# Patient Record
Sex: Female | Born: 1955 | Race: White | Hispanic: No | Marital: Married | Smoking: Former smoker
Health system: Southern US, Community
[De-identification: ages and names within clinical notes are randomized; demographics above are authoritative.]

## PROBLEM LIST (undated history)

## (undated) DIAGNOSIS — Z8719 Personal history of other diseases of the digestive system: Secondary | ICD-10-CM

## (undated) DIAGNOSIS — M65949 Unspecified synovitis and tenosynovitis, unspecified hand: Secondary | ICD-10-CM

## (undated) DIAGNOSIS — J40 Bronchitis, not specified as acute or chronic: Secondary | ICD-10-CM

## (undated) DIAGNOSIS — N393 Stress incontinence (female) (male): Secondary | ICD-10-CM

## (undated) DIAGNOSIS — Z972 Presence of dental prosthetic device (complete) (partial): Secondary | ICD-10-CM

## (undated) DIAGNOSIS — Z9989 Dependence on other enabling machines and devices: Secondary | ICD-10-CM

## (undated) DIAGNOSIS — M1990 Unspecified osteoarthritis, unspecified site: Secondary | ICD-10-CM

## (undated) DIAGNOSIS — I872 Venous insufficiency (chronic) (peripheral): Secondary | ICD-10-CM

## (undated) DIAGNOSIS — M659 Synovitis and tenosynovitis, unspecified: Secondary | ICD-10-CM

## (undated) DIAGNOSIS — Z87820 Personal history of traumatic brain injury: Secondary | ICD-10-CM

## (undated) DIAGNOSIS — K5909 Other constipation: Secondary | ICD-10-CM

## (undated) DIAGNOSIS — F1190 Opioid use, unspecified, uncomplicated: Secondary | ICD-10-CM

## (undated) DIAGNOSIS — F32A Depression, unspecified: Secondary | ICD-10-CM

## (undated) DIAGNOSIS — G43909 Migraine, unspecified, not intractable, without status migrainosus: Secondary | ICD-10-CM

## (undated) DIAGNOSIS — R351 Nocturia: Secondary | ICD-10-CM

## (undated) DIAGNOSIS — G4733 Obstructive sleep apnea (adult) (pediatric): Secondary | ICD-10-CM

## (undated) DIAGNOSIS — F329 Major depressive disorder, single episode, unspecified: Secondary | ICD-10-CM

## (undated) DIAGNOSIS — Z872 Personal history of diseases of the skin and subcutaneous tissue: Secondary | ICD-10-CM

## (undated) DIAGNOSIS — R413 Other amnesia: Secondary | ICD-10-CM

## (undated) DIAGNOSIS — Z9289 Personal history of other medical treatment: Secondary | ICD-10-CM

## (undated) DIAGNOSIS — R234 Changes in skin texture: Secondary | ICD-10-CM

## (undated) DIAGNOSIS — R058 Other specified cough: Secondary | ICD-10-CM

## (undated) DIAGNOSIS — T8452XA Infection and inflammatory reaction due to internal left hip prosthesis, initial encounter: Secondary | ICD-10-CM

## (undated) DIAGNOSIS — R262 Difficulty in walking, not elsewhere classified: Secondary | ICD-10-CM

## (undated) DIAGNOSIS — K08109 Complete loss of teeth, unspecified cause, unspecified class: Secondary | ICD-10-CM

## (undated) DIAGNOSIS — R05 Cough: Secondary | ICD-10-CM

## (undated) DIAGNOSIS — K219 Gastro-esophageal reflux disease without esophagitis: Secondary | ICD-10-CM

## (undated) HISTORY — PX: TOTAL KNEE ARTHROPLASTY: SHX125

## (undated) HISTORY — PX: OTHER SURGICAL HISTORY: SHX169

## (undated) HISTORY — PX: APPENDECTOMY: SHX54

---

## 1989-06-26 HISTORY — PX: OTHER SURGICAL HISTORY: SHX169

## 1998-06-10 ENCOUNTER — Encounter: Payer: Self-pay | Admitting: Internal Medicine

## 1998-06-10 ENCOUNTER — Ambulatory Visit (HOSPITAL_COMMUNITY): Admission: RE | Admit: 1998-06-10 | Discharge: 1998-06-10 | Payer: Self-pay | Admitting: Internal Medicine

## 1999-12-18 ENCOUNTER — Emergency Department (HOSPITAL_COMMUNITY): Admission: EM | Admit: 1999-12-18 | Discharge: 1999-12-18 | Payer: Self-pay | Admitting: Emergency Medicine

## 2000-03-25 ENCOUNTER — Ambulatory Visit (HOSPITAL_COMMUNITY): Admission: RE | Admit: 2000-03-25 | Discharge: 2000-03-25 | Payer: Self-pay | Admitting: *Deleted

## 2001-06-08 ENCOUNTER — Ambulatory Visit (HOSPITAL_COMMUNITY): Admission: RE | Admit: 2001-06-08 | Discharge: 2001-06-08 | Payer: Self-pay | Admitting: Family Medicine

## 2002-01-02 ENCOUNTER — Encounter: Admission: RE | Admit: 2002-01-02 | Discharge: 2002-01-02 | Payer: Self-pay | Admitting: *Deleted

## 2002-01-02 ENCOUNTER — Encounter: Payer: Self-pay | Admitting: *Deleted

## 2002-05-18 ENCOUNTER — Other Ambulatory Visit: Admission: RE | Admit: 2002-05-18 | Discharge: 2002-05-18 | Payer: Self-pay | Admitting: Family Medicine

## 2002-05-31 ENCOUNTER — Ambulatory Visit (HOSPITAL_COMMUNITY): Admission: RE | Admit: 2002-05-31 | Discharge: 2002-05-31 | Payer: Self-pay | Admitting: Family Medicine

## 2002-06-20 ENCOUNTER — Encounter: Admission: RE | Admit: 2002-06-20 | Discharge: 2002-06-20 | Payer: Self-pay | Admitting: *Deleted

## 2003-05-21 ENCOUNTER — Other Ambulatory Visit: Admission: RE | Admit: 2003-05-21 | Discharge: 2003-05-21 | Payer: Self-pay | Admitting: Family Medicine

## 2003-06-05 ENCOUNTER — Encounter: Payer: Self-pay | Admitting: Family Medicine

## 2003-06-05 ENCOUNTER — Encounter: Admission: RE | Admit: 2003-06-05 | Discharge: 2003-06-05 | Payer: Self-pay | Admitting: Family Medicine

## 2003-07-11 ENCOUNTER — Encounter: Payer: Self-pay | Admitting: Internal Medicine

## 2003-07-11 ENCOUNTER — Encounter: Admission: RE | Admit: 2003-07-11 | Discharge: 2003-07-11 | Payer: Self-pay | Admitting: Internal Medicine

## 2003-08-09 ENCOUNTER — Encounter: Payer: Self-pay | Admitting: Internal Medicine

## 2003-08-09 ENCOUNTER — Encounter: Admission: RE | Admit: 2003-08-09 | Discharge: 2003-08-09 | Payer: Self-pay | Admitting: Internal Medicine

## 2003-08-12 ENCOUNTER — Observation Stay (HOSPITAL_COMMUNITY): Admission: EM | Admit: 2003-08-12 | Discharge: 2003-08-13 | Payer: Self-pay | Admitting: Emergency Medicine

## 2003-08-12 ENCOUNTER — Encounter: Payer: Self-pay | Admitting: Emergency Medicine

## 2003-08-12 ENCOUNTER — Encounter (INDEPENDENT_AMBULATORY_CARE_PROVIDER_SITE_OTHER): Payer: Self-pay

## 2003-08-12 HISTORY — PX: LAPAROSCOPIC CHOLECYSTECTOMY: SUR755

## 2006-10-26 HISTORY — PX: OTHER SURGICAL HISTORY: SHX169

## 2007-10-27 HISTORY — PX: TOTAL KNEE ARTHROPLASTY: SHX125

## 2007-10-27 HISTORY — PX: KNEE ARTHROSCOPY: SUR90

## 2009-07-02 ENCOUNTER — Ambulatory Visit (HOSPITAL_COMMUNITY): Admission: RE | Admit: 2009-07-02 | Discharge: 2009-07-02 | Payer: Self-pay | Admitting: Orthopedic Surgery

## 2010-06-13 ENCOUNTER — Emergency Department (HOSPITAL_COMMUNITY)
Admission: EM | Admit: 2010-06-13 | Discharge: 2010-06-13 | Payer: Self-pay | Source: Home / Self Care | Admitting: Emergency Medicine

## 2010-08-26 HISTORY — PX: OTHER SURGICAL HISTORY: SHX169

## 2010-10-15 ENCOUNTER — Inpatient Hospital Stay: Admission: RE | Admit: 2010-10-15 | Payer: Self-pay | Source: Home / Self Care | Admitting: Orthopedic Surgery

## 2010-10-26 HISTORY — PX: TOTAL HIP ARTHROPLASTY: SHX124

## 2011-03-13 NOTE — H&P (Signed)
[name], [name]                        ACCOUNT NO.:  [id number]   MEDICAL RECORD NO.:  [id number]                   PATIENT TYPE:  INP   LOCATION:  0101                                 FACILITY:  [facility]   PHYSICIAN:  Lorre Munroe., M.D.            DATE OF BIRTH:  1955/12/10   DATE OF ADMISSION:  08/12/2003  DATE OF DISCHARGE:                                HISTORY & PHYSICAL   CHIEF COMPLAINT:  Abdominal pain.   PRESENT ILLNESS:  The patient is a 55 year old white female who has not felt  well for several days but last night developed severe pain in the area of  the right scapula, epigastrium, lower chest, and right upper quadrant.  She  vomited a time or two.  The pain did not remit, and she came to the  emergency department where electrocardiogram was normal, and a gallbladder  ultrasound demonstrated a distended gallbladder with stones, a stone  evidently impacted in the cystic duct.  The intrahepatic and extrahepatic  biliary ducts were normal size.  The liver tests were normal, and the CBC  was normal.  The patient is felt to have acute cholecystitis and is admitted  to the hospital for a laparoscopic cholecystectomy.   PAST MEDICAL HISTORY:  1. 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  2. 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  3. She has gastroesophageal reflux.  For that, she is on Protonix.  4. She takes Depakote and trazodone for depression.   SOCIAL HISTORY:  The patient smoked until several months ago and stopped,  and she does not smoke any longer.  She does drink alcoholic beverages.  No  history of heart disease or other serious chronic aliments.   FAMILY HISTORY & CHILDHOOD ILLNESSES:  Unremarkable.   PHYSICAL EXAMINATION:  GENERAL:  The patient is overweight.  No acute  distress, but she said she was in pain.  VITAL SIGNS:  Vital signs were normal per nursing staff.  No fever.  HEAD/NECK/EYES/EARS/NOSE/MOUTH/THROAT:  Unremarkable with a normal thyroid  and no lymphadenopathy.  CHEST:  Clear to auscultation.  HEART:  Rate and rhythm normal.  No murmur or gallop.  BREASTS:  Normal.  ABDOMEN:  Tender in the right upper quadrant with a palpable mass.  Otherwise normal with no hernia, organomegaly, or mass.  GENITOURINARY:  External genitalia normal.  RECTAL:  Not performed.  EXTREMITIES:  Normal.  SKIN:  Normal.  LYMPH NODES:  Not enlarged.  NEUROLOGICAL:  Normal.    IMPRESSION:  Acute cholecystitis and cholelithiasis.   PLAN:  Laparoscopic cholecystectomy.  The patient agrees to this.  Lorre Munroe., M.D.    WB/MEDQ  D:  08/12/2003  T:  08/12/2003  Job:  [id number]

## 2011-03-13 NOTE — Op Note (Signed)
[name], [name]                        ACCOUNT NO.:  [id number]   MEDICAL RECORD NO.:  [id number]                   PATIENT TYPE:  INP   LOCATION:  0101                                 FACILITY:  [facility]   PHYSICIAN:  Lorre Munroe., M.D.            DATE OF BIRTH:  1956/06/09   DATE OF PROCEDURE:  08/12/2003  DATE OF DISCHARGE:                                 OPERATIVE REPORT   PREOPERATIVE DIAGNOSIS:  Cholelithiasis and acute cholecystitis.   POSTOPERATIVE DIAGNOSIS:  Cholelithiasis and acute cholecystitis.   OPERATION/PROCEDURE:  Laparoscopic cholecystectomy.   SURGEON:  Lebron Conners, M.D.   ASSISTANT:  Anselm Pancoast. Zachery Dakins, M.D.   ANESTHESIA:  General.   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  I put in my finger to make sure that the local area was  free of adhesions.  I then put a 0 Vicryl pursestring suture in the fascia  and secured a Hasson cannula and inflated the abdomen with CO2.  Put in the  camera and saw that there were some adhesions in the upper and lower midline  but I had good access to the right upper quadrant.  Under direct vision,  through anesthetized sites, I placed three additional ports and placed the  patient head-up foot-down and tilted to the left.  I took down some  adhesions of the omentum to the undersurface of the liver and then I had  clear view of a markedly distended edematous gallbladder.  Using the suction  aspirator, I emptied it of bile which was mostly depigmented, indicating  cystic duct obstruction.  I then retracted the gallbladder fundus toward the  right shoulder and took  down adhesions further until I saw the infundibulum  of the gallbladder and I dissected it down further until I clearly  identified the cystic duct, emerging from the infundibulum of the  gallbladder.  I clipped it with four clips and divided between the two which  were closest to the gallbladder.  I then dissected out the cystic artery and  found two distinct branches and ligated each with three clips and cut  between the two closer to the gallbladder.  I then removed the gallbladder  from its fossa using the cautery and got hemostasis with the cautery.  After  detaching the gallbladder from the liver, I placed it in a plastic pouch and  removed it through the umbilical incision.  Prior to removal copiously  irrigated the right upper quadrant and removed the irrigant, again assured  that hemostasis was good.  After removal of the gallbladder from the body  through  the umbilical incision, I tied the pursestring suture and then  removed the lateral ports under direct vision and allowed the CO2 to escape,  then removed the epigastric port.  I closed all of the incisions with  intracuticular 4-0 Vicryl and Steri-Strips.  She was stable through the  procedure.                                                Lorre Munroe., M.D.   WB/MEDQ  D:  08/12/2003  T:  08/12/2003  Job:  [id number]

## 2013-10-26 HISTORY — PX: REVISION TOTAL HIP ARTHROPLASTY: SHX766

## 2013-11-26 HISTORY — PX: OTHER SURGICAL HISTORY: SHX169

## 2014-07-06 ENCOUNTER — Encounter (HOSPITAL_BASED_OUTPATIENT_CLINIC_OR_DEPARTMENT_OTHER): Payer: Self-pay | Admitting: *Deleted

## 2014-07-09 ENCOUNTER — Encounter (HOSPITAL_BASED_OUTPATIENT_CLINIC_OR_DEPARTMENT_OTHER): Payer: Self-pay | Admitting: *Deleted

## 2014-07-09 NOTE — Progress Notes (Signed)
NPO AFTER MN WITH EXCEPTION CLEAR LIQUIDS UNTIL 0700 (NO CREAM/ MILK PRODUCTS).  ARRIVE AT 1130. NEEDS HG.  WILL TAKE OPANA, PRILOSEC, GABAPENTIN, AND CELEXA AM DOS W/ SIPS OF WATER AND IF NEEDED MAY TAKE PRN MEDS.  PT UNABLE TO WALK DUE TO NO HIP JOINT USES W/C   OR CRUTCHES.

## 2014-07-12 ENCOUNTER — Ambulatory Visit (HOSPITAL_BASED_OUTPATIENT_CLINIC_OR_DEPARTMENT_OTHER)
Admission: RE | Admit: 2014-07-12 | Discharge: 2014-07-12 | Disposition: A | Discharge status: Home / Self Care | Payer: Medicare Other | Source: Ambulatory Visit | Attending: Orthopedic Surgery | Admitting: Orthopedic Surgery

## 2014-07-12 ENCOUNTER — Encounter (HOSPITAL_BASED_OUTPATIENT_CLINIC_OR_DEPARTMENT_OTHER)
Admission: RE | Disposition: A | Discharge status: Home / Self Care | Payer: Self-pay | Source: Ambulatory Visit | Attending: Orthopedic Surgery

## 2014-07-12 ENCOUNTER — Encounter (HOSPITAL_BASED_OUTPATIENT_CLINIC_OR_DEPARTMENT_OTHER): Payer: Self-pay | Admitting: *Deleted

## 2014-07-12 ENCOUNTER — Ambulatory Visit (HOSPITAL_BASED_OUTPATIENT_CLINIC_OR_DEPARTMENT_OTHER): Payer: Medicare Other | Admitting: Anesthesiology

## 2014-07-12 ENCOUNTER — Encounter (HOSPITAL_BASED_OUTPATIENT_CLINIC_OR_DEPARTMENT_OTHER): Payer: Medicare Other | Admitting: Anesthesiology

## 2014-07-12 DIAGNOSIS — F329 Major depressive disorder, single episode, unspecified: Secondary | ICD-10-CM | POA: Diagnosis not present

## 2014-07-12 DIAGNOSIS — M159 Polyosteoarthritis, unspecified: Secondary | ICD-10-CM | POA: Insufficient documentation

## 2014-07-12 DIAGNOSIS — F3289 Other specified depressive episodes: Secondary | ICD-10-CM | POA: Insufficient documentation

## 2014-07-12 DIAGNOSIS — M65839 Other synovitis and tenosynovitis, unspecified forearm: Secondary | ICD-10-CM | POA: Insufficient documentation

## 2014-07-12 DIAGNOSIS — G4733 Obstructive sleep apnea (adult) (pediatric): Secondary | ICD-10-CM | POA: Insufficient documentation

## 2014-07-12 DIAGNOSIS — K449 Diaphragmatic hernia without obstruction or gangrene: Secondary | ICD-10-CM | POA: Diagnosis not present

## 2014-07-12 DIAGNOSIS — Z7982 Long term (current) use of aspirin: Secondary | ICD-10-CM | POA: Insufficient documentation

## 2014-07-12 DIAGNOSIS — M65849 Other synovitis and tenosynovitis, unspecified hand: Principal | ICD-10-CM

## 2014-07-12 DIAGNOSIS — Z79899 Other long term (current) drug therapy: Secondary | ICD-10-CM | POA: Diagnosis not present

## 2014-07-12 DIAGNOSIS — M65341 Trigger finger, right ring finger: Secondary | ICD-10-CM

## 2014-07-12 DIAGNOSIS — K219 Gastro-esophageal reflux disease without esophagitis: Secondary | ICD-10-CM | POA: Diagnosis not present

## 2014-07-12 HISTORY — PX: TENOSYNOVECTOMY: SHX6110

## 2014-07-12 HISTORY — DX: Difficulty in walking, not elsewhere classified: R26.2

## 2014-07-12 HISTORY — DX: Stress incontinence (female) (male): N39.3

## 2014-07-12 HISTORY — DX: Dependence on other enabling machines and devices: Z99.89

## 2014-07-12 HISTORY — DX: Opioid use, unspecified, uncomplicated: F11.90

## 2014-07-12 HISTORY — DX: Unspecified osteoarthritis, unspecified site: M19.90

## 2014-07-12 HISTORY — DX: Personal history of diseases of the skin and subcutaneous tissue: Z87.2

## 2014-07-12 HISTORY — DX: Depression, unspecified: F32.A

## 2014-07-12 HISTORY — DX: Obstructive sleep apnea (adult) (pediatric): G47.33

## 2014-07-12 HISTORY — DX: Changes in skin texture: R23.4

## 2014-07-12 HISTORY — DX: Gastro-esophageal reflux disease without esophagitis: K21.9

## 2014-07-12 HISTORY — DX: Presence of dental prosthetic device (complete) (partial): Z97.2

## 2014-07-12 HISTORY — DX: Complete loss of teeth, unspecified cause, unspecified class: K08.109

## 2014-07-12 HISTORY — DX: Nocturia: R35.1

## 2014-07-12 HISTORY — DX: Cough: R05

## 2014-07-12 HISTORY — DX: Unspecified synovitis and tenosynovitis, unspecified hand: M65.949

## 2014-07-12 HISTORY — DX: Other specified cough: R05.8

## 2014-07-12 HISTORY — DX: Synovitis and tenosynovitis, unspecified: M65.9

## 2014-07-12 HISTORY — DX: Other constipation: K59.09

## 2014-07-12 HISTORY — DX: Personal history of traumatic brain injury: Z87.820

## 2014-07-12 HISTORY — DX: Major depressive disorder, single episode, unspecified: F32.9

## 2014-07-12 HISTORY — DX: Migraine, unspecified, not intractable, without status migrainosus: G43.909

## 2014-07-12 HISTORY — DX: Infection and inflammatory reaction due to internal left hip prosthesis, initial encounter: T84.52XA

## 2014-07-12 HISTORY — DX: Personal history of other diseases of the digestive system: Z87.19

## 2014-07-12 HISTORY — DX: Bronchitis, not specified as acute or chronic: J40

## 2014-07-12 LAB — POCT HEMOGLOBIN-HEMACUE: Hemoglobin: 14.4 g/dL (ref 12.0–15.0)

## 2014-07-12 SURGERY — TENOSYNOVECTOMY
Anesthesia: Monitor Anesthesia Care | Site: Finger | Laterality: Right

## 2014-07-12 MED ORDER — CEFAZOLIN SODIUM-DEXTROSE 2-3 GM-% IV SOLR
INTRAVENOUS | Status: AC
Start: 2014-07-12 — End: 2014-07-12
  Filled 2014-07-12: qty 50

## 2014-07-12 MED ORDER — MIDAZOLAM HCL 2 MG/2ML IJ SOLN
INTRAMUSCULAR | Status: AC
  Filled 2014-07-12: qty 2

## 2014-07-12 MED ORDER — FENTANYL CITRATE 0.05 MG/ML IJ SOLN
INTRAMUSCULAR | Status: AC
  Filled 2014-07-12: qty 2

## 2014-07-12 MED ORDER — FENTANYL CITRATE 0.05 MG/ML IJ SOLN
INTRAMUSCULAR | Status: DC | PRN
  Administered 2014-07-12: 100 ug via INTRAVENOUS

## 2014-07-12 MED ORDER — MIDAZOLAM HCL 5 MG/5ML IJ SOLN
INTRAMUSCULAR | Status: DC | PRN
  Administered 2014-07-12: 2 mg via INTRAVENOUS

## 2014-07-12 MED ORDER — LACTATED RINGERS IV SOLN
INTRAVENOUS | Status: DC
  Administered 2014-07-12: 12:00:00 via INTRAVENOUS
  Filled 2014-07-12: qty 1000

## 2014-07-12 MED ORDER — PROPOFOL 10 MG/ML IV BOLUS
INTRAVENOUS | Status: DC | PRN
  Administered 2014-07-12: 60 mg via INTRAVENOUS
  Administered 2014-07-12 (×2): 20 mg via INTRAVENOUS

## 2014-07-12 MED ORDER — CEFAZOLIN SODIUM-DEXTROSE 2-3 GM-% IV SOLR
2.0000 g | INTRAVENOUS | Status: DC
  Filled 2014-07-12: qty 50

## 2014-07-12 MED ORDER — BUPIVACAINE HCL (PF) 0.25 % IJ SOLN
INTRAMUSCULAR | Status: DC | PRN
  Administered 2014-07-12: 10 mL

## 2014-07-12 MED ORDER — LIDOCAINE HCL 1 % IJ SOLN
INTRAMUSCULAR | Status: DC | PRN
  Administered 2014-07-12: 10 mL

## 2014-07-12 MED ORDER — OXYCODONE-ACETAMINOPHEN 10-325 MG PO TABS: 1.0000 | ORAL_TABLET | Freq: Three times a day (TID) | ORAL | Status: DC | PRN

## 2014-07-12 MED ORDER — CHLORHEXIDINE GLUCONATE 4 % EX LIQD
60.0000 mL | Freq: Once | CUTANEOUS | Status: DC
  Filled 2014-07-12: qty 60

## 2014-07-12 SURGICAL SUPPLY — 50 items
BANDAGE ELASTIC 3 VELCRO ST LF (GAUZE/BANDAGES/DRESSINGS) ×4 IMPLANT
BANDAGE ELASTIC 4 VELCRO ST LF (GAUZE/BANDAGES/DRESSINGS) IMPLANT
BLADE SURG 15 STRL LF DISP TIS (BLADE) ×1 IMPLANT
BLADE SURG 15 STRL SS (BLADE) ×1
BNDG COHESIVE 3X5 TAN STRL LF (GAUZE/BANDAGES/DRESSINGS) IMPLANT
BNDG CONFORM 2 STRL LF (GAUZE/BANDAGES/DRESSINGS) ×2 IMPLANT
BNDG GAUZE ELAST 4 BULKY (GAUZE/BANDAGES/DRESSINGS) ×2 IMPLANT
CLOTH BEACON ORANGE TIMEOUT ST (SAFETY) ×2 IMPLANT
CORDS BIPOLAR (ELECTRODE) IMPLANT
COVER MAYO STAND STRL (DRAPES) ×2 IMPLANT
COVER TABLE BACK 60X90 (DRAPES) ×2 IMPLANT
CUFF TOURN SGL QUICK 18 (TOURNIQUET CUFF) ×2 IMPLANT
DRAIN PENROSE 18X1/4 LTX STRL (WOUND CARE) IMPLANT
DRAPE EXTREMITY T 121X128X90 (DRAPE) ×2 IMPLANT
DRAPE SURG 17X23 STRL (DRAPES) ×2 IMPLANT
DRSG EMULSION OIL 3X3 NADH (GAUZE/BANDAGES/DRESSINGS) IMPLANT
ELECT NEEDLE TIP 2.8 STRL (NEEDLE) IMPLANT
ELECT REM PT RETURN 9FT ADLT (ELECTROSURGICAL) ×2
ELECTRODE REM PT RTRN 9FT ADLT (ELECTROSURGICAL) ×1 IMPLANT
GAUZE SPONGE 4X4 16PLY XRAY LF (GAUZE/BANDAGES/DRESSINGS) ×2 IMPLANT
GAUZE XEROFORM 1X8 LF (GAUZE/BANDAGES/DRESSINGS) ×2 IMPLANT
GLOVE BIO SURGEON STRL SZ7.5 (GLOVE) ×2 IMPLANT
GLOVE BIO SURGEON STRL SZ8 (GLOVE) IMPLANT
GLOVE INDICATOR 7.5 STRL GRN (GLOVE) ×2 IMPLANT
GOWN STRL REUS W/ TWL XL LVL3 (GOWN DISPOSABLE) ×1 IMPLANT
GOWN STRL REUS W/TWL LRG LVL3 (GOWN DISPOSABLE) ×2 IMPLANT
GOWN STRL REUS W/TWL XL LVL3 (GOWN DISPOSABLE) ×5 IMPLANT
LOOP VESSEL MAXI BLUE (MISCELLANEOUS) IMPLANT
NEEDLE HYPO 25X1 1.5 SAFETY (NEEDLE) ×2 IMPLANT
NS IRRIG 500ML POUR BTL (IV SOLUTION) ×2 IMPLANT
PACK BASIN DAY SURGERY FS (CUSTOM PROCEDURE TRAY) ×2 IMPLANT
PAD CAST 3X4 CTTN HI CHSV (CAST SUPPLIES) ×1 IMPLANT
PADDING CAST ABS 4INX4YD NS (CAST SUPPLIES) ×1
PADDING CAST ABS COTTON 4X4 ST (CAST SUPPLIES) ×1 IMPLANT
PADDING CAST COTTON 3X4 STRL (CAST SUPPLIES) ×1
PENCIL BUTTON HOLSTER BLD 10FT (ELECTRODE) IMPLANT
SPLINT PLASTER CAST XFAST 3X15 (CAST SUPPLIES) IMPLANT
SPLINT PLASTER XTRA FASTSET 3X (CAST SUPPLIES)
SPONGE GAUZE 4X4 12PLY (GAUZE/BANDAGES/DRESSINGS) ×2 IMPLANT
SPONGE GAUZE 4X4 12PLY STER LF (GAUZE/BANDAGES/DRESSINGS) ×2 IMPLANT
STOCKINETTE 4X48 STRL (DRAPES) ×2 IMPLANT
SUT ETHILON 4 0 P 3 18 (SUTURE) IMPLANT
SUT ETHILON 5 0 P 3 18 (SUTURE)
SUT NYLON ETHILON 5-0 P-3 1X18 (SUTURE) IMPLANT
SUT PROLENE 4 0 PS 2 18 (SUTURE) ×2 IMPLANT
SYR BULB 3OZ (MISCELLANEOUS) ×2 IMPLANT
SYR CONTROL 10ML LL (SYRINGE) IMPLANT
TOWEL OR 17X24 6PK STRL BLUE (TOWEL DISPOSABLE) ×4 IMPLANT
UNDERPAD 30X30 INCONTINENT (UNDERPADS AND DIAPERS) ×2 IMPLANT
WATER STERILE IRR 500ML POUR (IV SOLUTION) ×2 IMPLANT

## 2014-07-12 NOTE — Brief Op Note (Signed)
07/12/2014  1:16 PM  PATIENT:  [name]  58 y.o. female  PRE-OPERATIVE DIAGNOSIS:  RIGHT RING/SMALL FINGER TENOSYNOVITIS  POST-OPERATIVE DIAGNOSIS:  RIGHT RING/SMALL FINGER TENOSYNOVITIS  PROCEDURE:  Procedure(s): RIGHT RING/SMALL FINGER TENOSYNOVECTOMY (Right)  SURGEON:  Surgeon(s) and Role:    * Sharma Covert, MD - Primary  PHYSICIAN ASSISTANT:   ASSISTANTS: none   ANESTHESIA:   MAC  EBL:     BLOOD ADMINISTERED:none  DRAINS: none   LOCAL MEDICATIONS USED:  MARCAINE     SPECIMEN:  No Specimen  DISPOSITION OF SPECIMEN:  N/A  COUNTS:  YES  TOURNIQUET:  * No tourniquets in log *  DICTATION: .Other Dictation: Dictation Number [id number]  PLAN OF CARE: Discharge to home after PACU  PATIENT DISPOSITION:  PACU - hemodynamically stable.   Delay start of Pharmacological VTE agent (>24hrs) due to surgical blood loss or risk of bleeding: not applicable

## 2014-07-12 NOTE — Anesthesia Preprocedure Evaluation (Addendum)
Anesthesia Evaluation  Patient identified by MRN, date of birth, ID band Patient awake    Reviewed: Allergy & Precautions, H&P , NPO status , Patient's Chart, lab work & pertinent test results  Airway Mallampati: II TM Distance: >3 FB Neck ROM: Full    Dental  (+) Upper Dentures, Lower Dentures   Pulmonary sleep apnea and Continuous Positive Airway Pressure Ventilation , Current Smoker,  breath sounds clear to auscultation  Pulmonary exam normal       Cardiovascular negative cardio ROS  Rhythm:Regular Rate:Normal     Neuro/Psych  Headaches, PSYCHIATRIC DISORDERS Depression    GI/Hepatic Neg liver ROS, hiatal hernia, GERD-  ,  Endo/Other  negative endocrine ROS  Renal/GU negative Renal ROS  negative genitourinary   Musculoskeletal  (+) Arthritis -,   Abdominal (+) + obese,   Peds negative pediatric ROS (+)  Hematology negative hematology ROS (+)   Anesthesia Other Findings   Reproductive/Obstetrics negative OB ROS                         Anesthesia Physical Anesthesia Plan  ASA: II  Anesthesia Plan: MAC   Post-op Pain Management:    Induction: Intravenous  Airway Management Planned:   Additional Equipment:   Intra-op Plan:   Post-operative Plan:   Informed Consent: I have reviewed the patients History and Physical, chart, labs and discussed the procedure including the risks, benefits and alternatives for the proposed anesthesia with the patient or authorized representative who has indicated his/her understanding and acceptance.   Dental advisory given  Plan Discussed with: CRNA  Anesthesia Plan Comments:         Anesthesia Quick Evaluation

## 2014-07-12 NOTE — Transfer of Care (Signed)
Immediate Anesthesia Transfer of Care Note  Patient: [name]  Procedure(s) Performed: Procedure(s): RIGHT RING/SMALL FINGER TENOSYNOVECTOMY (Right)  Patient Location: PACU and Short Stay  Anesthesia Type:MAC  Level of Consciousness: awake, alert  and oriented  Airway & Oxygen Therapy: Patient Spontanous Breathing  Post-op Assessment: Post -op Vital signs reviewed and stable  Post vital signs: Reviewed and stable  Complications: No apparent anesthesia complications

## 2014-07-12 NOTE — Discharge Instructions (Signed)
KEEP BANDAGE CLEAN AND DRY CALL OFFICE FOR F/U APPT [phone] in 2 weeks KEEP HAND ELEVATED ABOVE HEART OK TO APPLY ICE TO OPERATIVE AREA CONTACT OFFICE IF ANY WORSENING PAIN OR CONCERNS. Post Anesthesia Home Care Instructions  Activity: Get plenty of rest for the remainder of the day. A responsible adult should stay with you for 24 hours following the procedure.  For the next 24 hours, DO NOT: -Drive a car -Advertising copywriter -Drink alcoholic beverages -Take any medication unless instructed by your physician -Make any legal decisions or sign important papers.  Meals: Start with liquid foods such as gelatin or soup. Progress to regular foods as tolerated. Avoid greasy, spicy, heavy foods. If nausea and/or vomiting occur, drink only clear liquids until the nausea and/or vomiting subsides. Call your physician if vomiting continues.  Special Instructions/Symptoms: Your throat may feel dry or sore from the anesthesia or the breathing tube placed in your throat during surgery. If this causes discomfort, gargle with warm salt water. The discomfort should disappear within 24 hours.       HAND SURGERY    HOME CARE INSTRUCTIONS    The following instructions have been prepared to help you care for yourself upon your return home today.  Wound Care:  Keep your hand elevated above the level of your heart. Do not allow it to dangle by your side. Keep the dressing dry and do not remove it unless your doctor advises you to do so. He will usually change it at the time of you post-op visit. Moving your fingers is advised to stimulate circulation but will depend on the site of your surgery. Of course, if you have a splint applied your doctor will advise you about movement.  Activity:  Do not drive or operate machinery today. Rest today and then you may return to your normal activity and work as indicated by your physician.  Diet: Drink liquids today or eat a light diet. You may resume a regular diet  tomorrow.  General expectations: Pain for two or three days. Fingers may become slightly swollen.   Unexpected Observations- Call your doctor if any of these occur: Severe pain not relieved by pain medication. Elevated temperature. Dressing soaked with blood. Inability to move fingers. White or bluish color to fingers.

## 2014-07-12 NOTE — H&P (Signed)
[name] is an 58 y.o. female.   Chief Complaint: RIGHT SMALL AND RING FINGER PAIN HPI: PT FOLLOWED IN OFFICE PT WITH PAIN AND DECREASED FUNCTION IN RIGHT RING AND SMALL FINGERS PT HERE FOR SURGERY NO PRIOR SURGERY TO RIGHT HAND   Past Medical History  Diagnosis Date  . Tenosynovitis of fingers     RIGHT RING AND SMALL  . Bronchitis     dx  07-04-2014 (approx) will finished antibiotic 07-14-2014  . Productive cough   . History of concussion     x4  --  no residual  . Migraines   . Depression   . 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 GERD (gastroesophageal reflux disease)   . H/O hiatal hernia   . Chronic constipation   . Chronic narcotic use   . Nocturia   . SUI (stress urinary incontinence, female)   . OA (osteoarthritis)     hips, knees, hips  . Infection of left prosthetic hip joint     left total arthroplasty done 2012/  jan 2015 removal of prosthesis  . Full dentures   . History of cellulitis     lower extremities  . Thinning of skin     easily tears  . OSA on CPAP     study x3   last one few yrs ago  cpap recommended -- states uses most every night     Past Surgical History  Procedure Laterality Date  . Appendectomy  66 (age 69)  . 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 Orif left foot fx  1990's  . Orif right tibial fx  2008    hardware removed same year  . Open repair and fixation right tibia and knee  X2  2009  . Total knee arthroplasty Right X2  2009  &  2010  . Total knee arthroplasty Left 2009  . Knee arthroscopy Left 2009  . Orif pelvic fx's  11 /2011  . Total hip arthroplasty Left 2012  . Revision total hip arthroplasty Left 01/ 2015  . Removal prothesis  left hip arthroplasty  02/ 2015    INFECTION  . Laparoscopic cholecystectomy  08-12-2003    History reviewed. No  pertinent family history. Social History:  reports that she has been smoking Cigarettes.  She has a 30 pack-year smoking history. She has never used smokeless tobacco. She reports that she does not drink alcohol or use illicit drugs.  Allergies: No Known Allergies  Medications Prior to Admission  Medication Sig Dispense Refill  . 5-HTP CAPS Take 1 capsule by mouth every evening.       Marland Kitchen aspirin EC 81 MG tablet Take 81 mg by mouth daily.      . Calcium-Magnesium-Zinc (CAL-MAG-ZINC PO) Take 1 tablet by mouth every evening.      . citalopram (CELEXA) 40 MG tablet Take 40 mg by mouth every morning.      . Docosahexaenoic Acid (DHA OMEGA 3 PO) Take 1 capsule by mouth every evening.      Marland Kitchen doxycycline (VIBRAMYCIN) 100 MG capsule Take 100 mg by mouth 2 (two) times daily.      . furosemide (LASIX) 40 MG tablet Take 40 mg by mouth 2 (two) times daily as needed (fluid retention).      Marland Kitchen  gabapentin (NEURONTIN) 400 MG capsule Take 400 mg by mouth 4 (four) times daily.      . meloxicam (MOBIC) 7.5 MG tablet Take 7.5 mg by mouth 2 (two) times daily as needed for pain.      . methocarbamol (ROBAXIN) 750 MG tablet Take 750 mg by mouth 2 (two) times daily as needed for muscle spasms.      . Misc Natural Products (JOINT SUPPORT COMPLEX PO) Take 1 capsule by mouth every evening.       . Multiple Vitamins-Minerals (HAIR/SKIN/NAILS/BIOTIN) TABS Take 1 capsule by mouth every evening.       . Multiple Vitamins-Minerals (WOMENS MULTIVITAMIN PLUS PO) Take 1 tablet by mouth every evening.       . Naloxegol Oxalate (MOVANTIK PO) Take 1 tablet by mouth daily as needed.      . nicotine polacrilex (COMMIT) 4 MG lozenge Take 4 mg by mouth as needed for smoking cessation.      . Nutritional Supp - Diet Aids (FAT BLOCKER PLUS) TABS Take 1 tablet by mouth every evening.      . Omega-3 Fatty Acids (FISH OIL) 1000 MG CAPS Take 1 capsule by mouth every evening.       Marland Kitchen OVER THE COUNTER MEDICATION Take 1 tablet by mouth every  evening.      Marland Kitchen oxybutynin (DITROPAN) 5 MG tablet Take 5 mg by mouth 2 (two) times daily as needed for bladder spasms.      Marland Kitchen oxyCODONE-acetaminophen (PERCOCET) 10-325 MG per tablet Take 1-2 tablets by mouth every 8 (eight) hours as needed for pain.      Marland Kitchen oxymorphone (OPANA ER) 20 MG 12 hr tablet Take 20 mg by mouth every 12 (twelve) hours.      . SUMAtriptan (IMITREX) 100 MG tablet Take 100 mg by mouth every 2 (two) hours as needed for migraine or headache. May repeat in 2 hours if headache persists or recurs.      . traZODone (DESYREL) 150 MG tablet Take 150 mg by mouth at bedtime.      . cyclobenzaprine (FLEXERIL) 10 MG tablet Take 10 mg by mouth 2 (two) times daily as needed for muscle spasms.        Results for orders placed during the hospital encounter of 07/12/14 (from the past 48 hour(s))  POCT HEMOGLOBIN-HEMACUE     Status: None   Collection Time    07/12/14 12:19 PM      Result Value Ref Range   Hemoglobin 14.4  12.0 - 15.0 g/dL   No results found.  ROS NO RECENT ILLNESSES OR HOSPITALIZATIONS  Blood pressure 139/62, pulse 84, temperature 98.8 F (37.1 C), temperature source Oral, resp. rate 18, height  (1.778 m), weight 108.41 kg (239 lb), SpO2 97.00%. Physical Exam   General Appearance:  Alert, cooperative, no distress, appears stated age  Head:  Normocephalic, without obvious abnormality, atraumatic  Eyes:  Pupils equal, conjunctiva/corneas clear,         Throat: Lips, mucosa, and tongue normal; teeth and gums normal  Neck: No visible masses     Lungs:   respirations unlabored  Chest Wall:  No tenderness or deformity  Heart:  Regular rate and rhythm,  Abdomen:   Soft, non-tender,         Extremities: RIGHT RING AND SMALL FINGERS WARM WELL PERFUSED GOOD DIGITAL MOTION BUT NOT FULL TTP OVER FLEXOR SHEATHS  Pulses: 2+ and symmetric  Skin: Skin color, texture, turgor normal, no rashes or  lesions     Neurologic: Normal    Assessment/Plan RIGHT RING  FINGERS STENOSING TENOSYNOVITIS RIGHT SMALL FINGER STENOSING TENOSYNOVITIS  RIGHT RING AND SMALL FINGER TENOSYNOVECTOMIES  R/B/A DISCUSSED WITH PT IN OFFICE.  PT VOICED UNDERSTANDING OF PLAN CONSENT SIGNED DAY OF SURGERY PT SEEN AND EXAMINED PRIOR TO OPERATIVE PROCEDURE/DAY OF SURGERY SITE MARKED. QUESTIONS ANSWERED WILL GO HOME FOLLOWING SURGERY  WE ARE PLANNING SURGERY FOR YOUR UPPER EXTREMITY. THE RISKS AND BENEFITS OF SURGERY INCLUDE BUT NOT LIMITED TO BLEEDING INFECTION, DAMAGE TO NEARBY NERVES ARTERIES TENDONS, FAILURE OF SURGERY TO ACCOMPLISH ITS INTENDED GOALS, PERSISTENT SYMPTOMS AND NEED FOR FURTHER SURGICAL INTERVENTION. WITH THIS IN MIND WE WILL PROCEED. I HAVE DISCUSSED WITH THE PATIENT THE PRE AND POSTOPERATIVE REGIMEN AND THE DOS AND DON'TS. PT VOICED UNDERSTANDING AND INFORMED CONSENT SIGNED.  Sharma Covert 07/12/2014, 12:50 PM

## 2014-07-12 NOTE — Anesthesia Postprocedure Evaluation (Signed)
Anesthesia Post Note  Patient: [name]  Procedure(s) Performed: Procedure(s) (LRB): RIGHT RING/SMALL FINGER TENOSYNOVECTOMY (Right)  Anesthesia type: MAC  Patient location: PACU  Post pain: Pain level controlled  Post assessment: Post-op Vital signs reviewed  Last Vitals: BP 158/73  Pulse 89  Temp(Src) 36.8 C (Oral)  Resp 12  Ht  (1.778 m)  Wt 239 lb (108.41 kg)  BMI 34.29 kg/m2  SpO2 94%  Post vital signs: Reviewed  Level of consciousness: awake  Complications: No apparent anesthesia complications

## 2014-07-13 ENCOUNTER — Encounter (HOSPITAL_BASED_OUTPATIENT_CLINIC_OR_DEPARTMENT_OTHER): Payer: Self-pay | Admitting: Orthopedic Surgery

## 2014-07-13 NOTE — Op Note (Signed)
[name], [name]            ACCOUNT NO.:  [id number]  MEDICAL RECORD NO.:  [id number]  LOCATION:                                 FACILITY:  PHYSICIAN:  Madelynn Done, MD       DATE OF BIRTH:  DATE OF PROCEDURE:  07/12/2014 DATE OF DISCHARGE:                              OPERATIVE REPORT   PREOPERATIVE DIAGNOSES: 1. Right ring finger stenosing tenosynovitis. 2. Right small finger stenosing tenosynovitis.  POSTOPERATIVE DIAGNOSES: 1. Right ring finger stenosing tenosynovitis. 2. Right small finger stenosing tenosynovitis.  ATTENDING PHYSICIAN:  Sharma Covert IV, MD, who was scrubbed and present for the entire procedure.  ASSISTANT SURGEON:  None.  ANESTHESIA:  Xylocaine 1% and 0.25% Marcaine local block with IV sedation.  SURGICAL PROCEDURES: 1. Right right finger tenosynovectomy, FDS. 2. Right ring finger tenosynovectomy, FDP. 3. Right small finger tenosynovectomy, FDP. 4. Right small finger tenosynovectomy, FDS.  SURGICAL INDICATIONS:  Mrs. [name] is a 58 year old right-hand dominant female with right ring finger and small finger stenosing tenosynovitis. The patient has failed nonsurgical treatment options, and the patient elected to undergo the above procedure.  Risks, benefits, and alternatives were discussed in detail with the patient and signed informed consent was obtained.  Risks include, but not limited to bleeding; infection; damage to nearby nerves, arteries, or tendons; loss of motion of the wrist and digits; incomplete relief of symptoms and need for further surgical intervention.  DESCRIPTION OF PROCEDURE:  The patient was properly identified in the preoperative holding area and marked with a permanent marker made on the right ring and small finger to indicate the correct operative site.  The patient was then brought back to the operating room and placed supine on the anesthesia room table where the IV sedation was administered.  Well- padded  tourniquet was then placed on the right forearm and sealed with 1000-drape.  The right upper extremity was then prepped and draped in normal sterile fashion after the local anesthetic was administered. Time-out was called, the correct side was identified, and procedure was then begun.  Attention was then turned to the right ring finger where an oblique incision was made directly over the ring finger A1 pulley region.  Dissection was carried down through the skin and subcutaneous tissue.  The blunt dissection was then carried all the way down to the flexor sheath and the flexor sheath was then exposed.  Under direct visualization, the A1 pulley was incised longitudinally.  Following this, further exposure was then carried out of the FDS and FDP and tenosynovectomy was then carried out of both tendons.  The patient did have a mild amount of inflammatory changes along the course of the tendon.  Tendinous changes were then carefully debrided.  The wound was then thoroughly irrigated.  Attention was then turned to the small finger.  Through a separate incision, oblique incision was made over the A1 pulley, similar technique was then used.  Blunt dissection was carried down to the flexor sheath where the A1 pulley was then identified and released.  Tenosynovectomy was then carried out of both the FDS and the FDP.  The patient had much more inflammatory changes along the course  of the FDS and the FDP to the small finger and then she did to the ring finger.  This tenosynovectomy was carried out in the palm extending into the digits.  The wounds were then thoroughly irrigated.  Following this, skin was then closed using horizontal mattress Prolene sutures.  Xeroform dressing was applied.  Sterile compressive bandage was applied.  The patient tolerated the procedure well, returned to the recovery room in good condition.  POSTPROCEDURAL PLAN:  The patient was discharged to home, seen back in the  office in approximately 2 weeks for wound check, suture removal, gradual use and activity.     Madelynn Done, MD     FWO/MEDQ  D:  07/12/2014  T:  07/12/2014  Job:  [id number]

## 2014-08-09 ENCOUNTER — Other Ambulatory Visit: Payer: Self-pay | Admitting: Orthopedic Surgery

## 2014-08-09 NOTE — Progress Notes (Signed)
Preoperative surgical orders have been place into the Epic hospital system for [name] on 08/09/2014, 12:39 PM  by Patrica DuelPERKINS, Naya Ilagan for surgery on 08/24/2014.  Preop Total Knee orders including Experal, IV Tylenol, and IV Decadron as long as there are no contraindications to the above medications. [name] Peacerew Davaun Quintela, PA-C

## 2014-08-16 ENCOUNTER — Encounter (HOSPITAL_COMMUNITY): Payer: Self-pay | Admitting: Pharmacy Technician

## 2014-08-16 NOTE — Progress Notes (Signed)
Avel Peacerew Perkins PA - please review and correct pt's preop order for her OR Consent.  She is coming tomorrow for preop.  Thanks.

## 2014-08-17 ENCOUNTER — Encounter (HOSPITAL_COMMUNITY): Payer: Self-pay

## 2014-08-17 ENCOUNTER — Ambulatory Visit (HOSPITAL_COMMUNITY)
Admission: RE | Admit: 2014-08-17 | Discharge: 2014-08-17 | Disposition: A | Discharge status: Home / Self Care | Payer: Medicare Other | Source: Ambulatory Visit | Attending: Anesthesiology | Admitting: Anesthesiology

## 2014-08-17 ENCOUNTER — Encounter (HOSPITAL_COMMUNITY)
Admission: RE | Admit: 2014-08-17 | Discharge: 2014-08-17 | Disposition: A | Discharge status: Home / Self Care | Payer: Medicare Other | Source: Ambulatory Visit | Attending: Orthopedic Surgery | Admitting: Orthopedic Surgery

## 2014-08-17 DIAGNOSIS — M009 Pyogenic arthritis, unspecified: Secondary | ICD-10-CM | POA: Diagnosis not present

## 2014-08-17 DIAGNOSIS — Z01818 Encounter for other preprocedural examination: Secondary | ICD-10-CM | POA: Insufficient documentation

## 2014-08-17 DIAGNOSIS — R9389 Abnormal findings on diagnostic imaging of other specified body structures: Secondary | ICD-10-CM

## 2014-08-17 HISTORY — DX: Venous insufficiency (chronic) (peripheral): I87.2

## 2014-08-17 HISTORY — DX: Other amnesia: R41.3

## 2014-08-17 LAB — URINE MICROSCOPIC-ADD ON

## 2014-08-17 LAB — CBC
HCT: 33.3 % — ABNORMAL LOW (ref 36.0–46.0)
Hemoglobin: 11.7 g/dL — ABNORMAL LOW (ref 12.0–15.0)
MCH: 31.4 pg (ref 26.0–34.0)
MCHC: 35.1 g/dL (ref 30.0–36.0)
MCV: 89.3 fL (ref 78.0–100.0)
PLATELETS: 100 10*3/uL — AB (ref 150–400)
RBC: 3.73 MIL/uL — ABNORMAL LOW (ref 3.87–5.11)
RDW: 13.5 % (ref 11.5–15.5)
WBC: 2.6 10*3/uL — ABNORMAL LOW (ref 4.0–10.5)

## 2014-08-17 LAB — APTT: aPTT: 34 seconds (ref 24–37)

## 2014-08-17 LAB — URINALYSIS, ROUTINE W REFLEX MICROSCOPIC
BILIRUBIN URINE: NEGATIVE
Glucose, UA: NEGATIVE mg/dL
Hgb urine dipstick: NEGATIVE
Ketones, ur: NEGATIVE mg/dL
Nitrite: NEGATIVE
Protein, ur: NEGATIVE mg/dL
Specific Gravity, Urine: 1.018 (ref 1.005–1.030)
UROBILINOGEN UA: 0.2 mg/dL (ref 0.0–1.0)
pH: 5.5 (ref 5.0–8.0)

## 2014-08-17 LAB — COMPREHENSIVE METABOLIC PANEL
ALBUMIN: 3.7 g/dL (ref 3.5–5.2)
ALT: 16 U/L (ref 0–35)
AST: 28 U/L (ref 0–37)
Alkaline Phosphatase: 106 U/L (ref 39–117)
Anion gap: 10 (ref 5–15)
BUN: 12 mg/dL (ref 6–23)
CO2: 27 meq/L (ref 19–32)
CREATININE: 0.84 mg/dL (ref 0.50–1.10)
Calcium: 9.4 mg/dL (ref 8.4–10.5)
Chloride: 99 mEq/L (ref 96–112)
GFR calc Af Amer: 88 mL/min — ABNORMAL LOW (ref >90)
GFR, EST NON AFRICAN AMERICAN: 76 mL/min — AB (ref >90)
Glucose, Bld: 98 mg/dL (ref 70–99)
Potassium: 4.6 mEq/L (ref 3.7–5.3)
Sodium: 136 mEq/L — ABNORMAL LOW (ref 137–147)
Total Bilirubin: 0.4 mg/dL (ref 0.3–1.2)
Total Protein: 7.5 g/dL (ref 6.0–8.3)

## 2014-08-17 LAB — SURGICAL PCR SCREEN
MRSA, PCR: NEGATIVE
STAPHYLOCOCCUS AUREUS: NEGATIVE

## 2014-08-17 LAB — PROTIME-INR
INR: 1.05 (ref 0.00–1.49)
Prothrombin Time: 13.8 seconds (ref 11.6–15.2)

## 2014-08-17 NOTE — Pre-Procedure Instructions (Signed)
PT'S PREOP LABS FAXED TO DR. ALUISIO'S OFFICE FOR REVIEW OF ABNORMALS.

## 2014-08-17 NOTE — Pre-Procedure Instructions (Signed)
EKG REPORT 08-09-14 AND CXR REPORT 06-18-14 ON [facility]ART [facility].  CXR REPORT ABNORMAL - CXR REPEATED TODAY.

## 2014-08-17 NOTE — Patient Instructions (Signed)
YOUR SURGERY IS SCHEDUL[facility] AT [facility]  ON:  Friday  10/30  REPORT TO  SHORT STAY CENTER AT:  5:15 AM   DO NOT EAT OR DRINK ANYTHING AFTER MIDNIGHT THE NIGHT BEFORE YOUR SURGERY.  YOU MAY BRUSH YOUR TEETH, RINSE OUT YOUR MOUTH--BUT NO WATER, NO FOOD, NO CHEWING GUM, NO MINTS, NO CANDIES, NO CHEWING TOBACCO.  PLEASE TAKE THE FOLLOWING MEDICATIONS THE AM OF YOUR SURGERY WITH A FEW SIPS OF WATER:  CITOLOPRAM,  GABAPENTIN, VISTARIL IF NEEDED, OPANA, IMITREX IF NEEDED, OMEPRAZOLE    DO NOT BRING VALUABLES, MONEY, CREDIT CARDS.  DO NOT WEAR JEWELRY, MAKE-UP, NAIL POLISH AND NO METAL PINS OR CLIPS IN YOUR HAIR. CONTACT LENS, DENTURES / PARTIALS, GLASSES SHOULD NOT BE WORN TO SURGERY AND IN MOST CASES-HEARING AIDS WILL NEED TO BE REMOVED.  BRING YOUR GLASSES CASE, ANY EQUIPMENT NEEDED FOR YOUR CONTACT LENS. FOR PATIENTS ADMITTED TO THE HOSPITAL--CHECK OUT TIME THE DAY OF DISCHARGE IS 11:00 AM.  ALL INPATIENT ROOMS ARE PRIVATE - WITH BATHROOM, TELEPHONE, TELEVISION AND WIFI INTERNET.    PLEASE BE AWARE THAT YOU MAY NEED ADDITIONAL BLOOD DRAWN DAY OF YOUR SURGERY  _______________________________________________________________________   [facility] - Preparing for [facility] Before surgery, you can play an important role.  Because skin is not sterile, your skin needs to be as free of germs as possible.  You can reduce the number of germs on your skin by washing with CHG (chlorahexidine gluconate) soap before surgery.  CHG is an antiseptic cleaner which kills germs and bonds with the skin to continue killing germs even after washing. Please DO NOT use if you have an allergy to CHG or antibacterial soaps.  If your skin becomes reddened/irritated stop using the CHG and inform your nurse when you arrive at Short Stay. Do not shave (including legs and underarms) for at least 48 hours prior to the first CHG shower.  You may shave your face/neck. Please follow these instructions  carefully:  1.  Shower with CHG Soap the night before surgery and the  morning of Surgery.  2.  If you choose to wash your hair, wash your hair first as usual with your  normal  shampoo.  3.  After you shampoo, rinse your hair and body thoroughly to remove the  shampoo.                           4.  Use CHG as you would any other liquid soap.  You can apply chg directly  to the skin and wash                       Gently with a scrungie or clean washcloth.  5.  Apply the CHG Soap to your body ONLY FROM THE NECK DOWN.   Do not use on face/ open                           Wound or open sores. Avoid contact with eyes, ears mouth and genitals (private parts).                       Wash face,  Genitals (private parts) with your normal soap.             6.  Wash thoroughly, paying special attention to the area where your surgery  will be performed.  7.  Thoroughly rinse your body with warm water from the neck down.  8.  DO NOT shower/wash with your normal soap after using and rinsing off  the CHG Soap.                9.  Pat yourself dry with a clean towel.            10.  Wear clean pajamas.            11.  Place clean sheets on your bed the night of your first shower and do not  sleep with pets. Day of Surgery : Do not apply any lotions/deodorants the morning of surgery.  Please wear clean clothes to the hospital/surgery center.  FAILURE TO FOLLOW THESE INSTRUCTIONS MAY RESULT IN THE CANCELLATION OF YOUR SURGERY PATIENT SIGNATURE_________________________________  NURSE SIGNATURE__________________________________  ________________________________________________________________________   [name]  An incentive spirometer is a tool that can help keep your lungs clear and active. This tool measures how well you are filling your lungs with each breath. Taking long deep breaths may help reverse or decrease the chance of developing breathing (pulmonary) problems (especially infection)  following:  A long period of time when you are unable to move or be active. BEFORE THE PROCEDURE   If the spirometer includes an indicator to show your best effort, your nurse or respiratory therapist will set it to a desired goal.  If possible, sit up straight or lean slightly forward. Try not to slouch.  Hold the incentive spirometer in an upright position. INSTRUCTIONS FOR USE  1. Sit on the edge of your bed if possible, or sit up as far as you can in bed or on a chair. 2. Hold the incentive spirometer in an upright position. 3. Breathe out normally. 4. Place the mouthpiece in your mouth and seal your lips tightly around it. 5. Breathe in slowly and as deeply as possible, raising the piston or the ball toward the top of the column. 6. Hold your breath for 3-5 seconds or for as long as possible. Allow the piston or ball to fall to the bottom of the column. 7. Remove the mouthpiece from your mouth and breathe out normally. 8. Rest for a few seconds and repeat Steps 1 through 7 at least 10 times every 1-2 hours when you are awake. Take your time and take a few normal breaths between deep breaths. 9. The spirometer may include an indicator to show your best effort. Use the indicator as a goal to work toward during each repetition. 10. After each set of 10 deep breaths, practice coughing to be sure your lungs are clear. If you have an incision (the cut made at the time of surgery), support your incision when coughing by placing a pillow or rolled up towels firmly against it. Once you are able to get out of bed, walk around indoors and cough well. You may stop using the incentive spirometer when instructed by your caregiver.  RISKS AND COMPLICATIONS  Take your time so you do not get dizzy or light-headed.  If you are in pain, you may need to take or ask for pain medication before doing incentive spirometry. It is harder to take a deep breath if you are having pain. AFTER USE  Rest and  breathe slowly and easily.  It can be helpful to keep track of a log of your progress. Your caregiver can provide you with a simple table to help with this. If you are  using the spirometer at home, follow these instructions: SEEK MEDICAL CARE IF:   You are having difficultly using the spirometer.  You have trouble using the spirometer as often as instructed.  Your pain medication is not giving enough relief while using the spirometer.  You develop fever of 100.5 F (38.1 C) or higher. SEEK IMMEDIATE MEDICAL CARE IF:   You cough up bloody sputum that had not been present before.  You develop fever of 102 F (38.9 C) or greater.  You develop worsening pain at or near the incision site. MAKE SURE YOU:   Understand these instructions.  Will watch your condition.  Will get help right away if you are not doing well or get worse. Document Released: 02/22/2007 Document Revised: 01/04/2012 Document Reviewed: 04/25/2007 ExitCare Patient Information 2014 ExitCare, [place].   ________________________________________________________________________  WHAT IS A BLOOD TRANSFUSION? Blood Transfusion Information  A transfusion is the replacement of blood or some of its parts. Blood is made up of multiple cells which provide different functions.  Red blood cells carry oxygen and are used for blood loss replacement.  White blood cells fight against infection.  Platelets control bleeding.  Plasma helps clot blood.  Other blood products are available for specialized needs, such as hemophilia or other clotting disorders. BEFORE THE TRANSFUSION  Who gives blood for transfusions?   Healthy volunteers who are fully evaluated to make sure their blood is safe. This is blood bank blood. Transfusion therapy is the safest it has ever been in the practice of medicine. Before blood is taken from a donor, a complete history is taken to make sure that person has no history of diseases nor engages in  risky social behavior (examples are intravenous drug use or sexual activity with multiple partners). The donor's travel history is screened to minimize risk of transmitting infections, such as malaria. The donated blood is tested for signs of infectious diseases, such as HIV and hepatitis. The blood is then tested to be sure it is compatible with you in order to minimize the chance of a transfusion reaction. If you or a relative donates blood, this is often done in anticipation of surgery and is not appropriate for emergency situations. It takes many days to process the donated blood. RISKS AND COMPLICATIONS Although transfusion therapy is very safe and saves many lives, the main dangers of transfusion include:   Getting an infectious disease.  Developing a transfusion reaction. This is an allergic reaction to something in the blood you were given. Every precaution is taken to prevent this. The decision to have a blood transfusion has been considered carefully by your caregiver before blood is given. Blood is not given unless the benefits outweigh the risks. AFTER THE TRANSFUSION  Right after receiving a blood transfusion, you will usually feel much better and more energetic. This is especially true if your red blood cells have gotten low (anemic). The transfusion raises the level of the red blood cells which carry oxygen, and this usually causes an energy increase.  The nurse administering the transfusion will monitor you carefully for complications. HOME CARE INSTRUCTIONS  No special instructions are needed after a transfusion. You may find your energy is better. Speak with your caregiver about any limitations on activity for underlying diseases you may have. SEEK MEDICAL CARE IF:   Your condition is not improving after your transfusion.  You develop redness or irritation at the intravenous (IV) site. SEEK IMMEDIATE MEDICAL CARE IF:  Any of the following symptoms occur  over the next 12  hours:  Shaking chills.  You have a temperature by mouth above 102 F (38.9 C), not controlled by medicine.  Chest, back, or muscle pain.  People around you feel you are not acting correctly or are confused.  Shortness of breath or difficulty breathing.  Dizziness and fainting.  You get a rash or develop hives.  You have a decrease in urine output.  Your urine turns a dark color or changes to pink, red, or brown. Any of the following symptoms occur over the next 10 days:  You have a temperature by mouth above 102 F (38.9 C), not controlled by medicine.  Shortness of breath.  Weakness after normal activity.  The white part of the eye turns yellow (jaundice).  You have a decrease in the amount of urine or are urinating less often.  Your urine turns a dark color or changes to pink, red, or brown. Document Released: 10/09/2000 Document Revised: 01/04/2012 Document Reviewed: 05/28/2008 [facility] Patient Information 2014 [place].  _______________________________________________________________________

## 2014-08-20 NOTE — Pre-Procedure Instructions (Signed)
FAXED NOTE RECEIVED FROM DR. Lequita HaltALUISIO - ABNORMAL LABS REVIEWED - NO ACTION.

## 2014-08-21 NOTE — Progress Notes (Signed)
Avel Peacerew Perkins, PA  - please review and correct preop order in Epic for pt's surgery.  Thanks

## 2014-08-23 ENCOUNTER — Ambulatory Visit: Payer: Self-pay | Admitting: Orthopedic Surgery

## 2014-08-23 ENCOUNTER — Encounter (HOSPITAL_COMMUNITY): Payer: Self-pay | Admitting: Anesthesiology

## 2014-08-23 NOTE — Progress Notes (Addendum)
[facility] requested clarification of consent order for surgery on 08/24/14.  1340  Spoke with Avel Peacerew Perkins PA. Explained need to rewrite new order for consent. Old order was discontinued by Clinical research associatewriter.

## 2014-08-23 NOTE — Progress Notes (Signed)
Please review and correct preop consent order.  Patient is scheduled for surgery on 08/24/2014.  Thank You.

## 2014-08-23 NOTE — Anesthesia Preprocedure Evaluation (Addendum)
Anesthesia Evaluation  Patient identified by MRN, date of birth, ID band Patient awake    Reviewed: Allergy & Precautions, H&P , NPO status , Patient's Chart, lab work & pertinent test results  Airway Mallampati: II  TM Distance: >3 FB Neck ROM: Full    Dental  (+) Lower Dentures, Upper Dentures   Pulmonary sleep apnea and Continuous Positive Airway Pressure Ventilation , former smoker,  CXR: No acute disease. Mild cardiomegaly. breath sounds clear to auscultation  Pulmonary exam normal       Cardiovascular negative cardio ROS  Rhythm:Regular Rate:Normal     Neuro/Psych  Headaches, PSYCHIATRIC DISORDERS Depression    GI/Hepatic Neg liver ROS, hiatal hernia, GERD-  Medicated,  Endo/Other  negative endocrine ROS  Renal/GU negative Renal ROS  negative genitourinary   Musculoskeletal  (+) Arthritis -,   Abdominal (+) + obese,   Peds negative pediatric ROS (+)  Hematology negative hematology ROS (+)   Anesthesia Other Findings   Reproductive/Obstetrics negative OB ROS                           Anesthesia Physical Anesthesia Plan  ASA: II  Anesthesia Plan: General   Post-op Pain Management:    Induction: Intravenous  Airway Management Planned: Oral ETT  Additional Equipment:   Intra-op Plan:   Post-operative Plan: Extubation in OR  Informed Consent: I have reviewed the patients History and Physical, chart, labs and discussed the procedure including the risks, benefits and alternatives for the proposed anesthesia with the patient or authorized representative who has indicated his/her understanding and acceptance.   Dental advisory given  Plan Discussed with: CRNA  Anesthesia Plan Comments: (Platelets 100K. Plan general.)        Anesthesia Quick Evaluation

## 2014-08-23 NOTE — H&P (Signed)
[name] DOB: [birth year] Married / Language: English / Race: White Female Date of Admission: 08/24/2014 Chief complaint:  Left hip pain History of Present Illness The patient is a 58 year old female who comes in  for a preoperative history and physical. The patient is scheduled for a left total hip reimplantation to be performed by Dr. Gus RankinFrank V. Aluisio, MD [facility] on 08-24-2014. The patient is a 58 year old female who presents with a hip problem. The patient was seen for a second opinion.The patient reports left hip. Prior to being seen the patient was previously evaluated by an out of town physician. 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 She had issues with the screws, and they were revised at the time of her total hip replacement in 2012. [facility]. She had an infection, and had to have an antibiotic spacer placed. The images she brought were on a DVD instead of CD, so they could not be imported into our system. She has a long complex history with regard to this left hip. She initially had the acetabular fracture which was [facility] with open [facility] and [facility]. She had most of her procedures in the past [facility]. She had both knees replaced there. She subsequently [facility] and eventually had a hip replacement in 2012. Unfortunately that eventually loosened. [place] and, for convenience purposes, was seen [facility]. Elesa MassedWard performed a total hip arthroplasty earlier this year. She did great for the first few weeks and was developing drainage from the hip. Approximately 4-6 weeks after surgery, she was taken back and had a grossly infected hip. It was treated with resection arthroplasty with an antibiotic spacer. She had prolonged antibiotics.  She says she was just recently cleared by her Infectious Disease physician and told that the infection had cleared up. She is ready to proceed with her surgery at this time.  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 First Degree Relatives reported Heart disease in female family member before age 57 Diabetes Mellitus Maternal Grandmother. Cerebrovascular Accident Sister. Osteoarthritis Brother, Maternal Grandmother, Sister. Rheumatoid Arthritis Maternal Grandmother, Mother. Heart Disease Sister. Chronic Obstructive Lung Disease Brother, Mother. Depression Brother, Sister. Cancer Father, Mother.  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 06/12/2014: smoke(d) 1/2 pack(s) per day Former drinker 06/12/2014: In the past drank  Medication History Movantik (25MG  Tablet, Oral) Active. Gabapentin (400MG  Capsule, Oral) Active. (4 x per day) TraZODone HCl (150MG  Tablet, Oral) Active. (BID) Omeprazole (20MG  Capsule DR, Oral) Active. (once daily) Meloxicam (7.5MG  Tablet, Oral) Active. (BID) Citalopram Hydrobromide (40MG  Tablet, Oral) Active. (once daily) Oxybutynin Chloride (5MG  Tablet, Oral) Active. (TID) Cyclobenzaprine HCl (10MG  Tablet, Oral) Active. (BID PRN) SUMAtriptan Succinate (100MG  Tablet, Oral) Active. (PRN) Furosemide (40MG  Tablet, Oral) Active. (1 1/2 PRN) OxyCODONE HCl (10MG  Tablet, Oral) Active. (TID PRN) Opana ER (20MG   Tab 12HR  Deter, Oral) Active. (BID) Multivitamins (Oral) Active. Vistaril (25MG  Capsule, Oral) Active.   Past Surgical History  Arthroscopy of Knee left Total Hip Replacement left Hip Fracture and Surgery left Total Knee Replacement bilateral Gallbladder Surgery laporoscopic Foot Surgery left Appendectomy Date: 1974. Resection Arthroplasty Left Total Hip  Medical History Migraine Headache Depression Osteoarthritis Anxiety Disorder Peripheral Neuropathy Chronic Pain Periprosthetic Infection Left Total Hip   Review of Systems General Not Present- Chills, Fatigue, Fever, Memory Loss, Night Sweats, Weight Gain and Weight Loss. Skin Not Present- Eczema, Hives, Itching, Lesions and Rash. HEENT Not Present- Dentures, Double Vision, Headache, Hearing Loss, Tinnitus and Visual Loss. Respiratory Not Present- Allergies, Chronic Cough, Coughing up blood, Shortness of breath at rest and Shortness of breath with exertion. Cardiovascular Not Present- Chest Pain, Difficulty Breathing Lying Down, Murmur, Palpitations, Racing/skipping heartbeats and Swelling. Gastrointestinal Not Present- Abdominal Pain, Bloody Stool, Constipation, Diarrhea, Difficulty Swallowing, Heartburn, Jaundice, Loss of appetitie, Nausea and Vomiting. Female Genitourinary Not Present- Blood in Urine, Discharge, Flank Pain, Incontinence, Painful Urination, Urgency, Urinary frequency, Urinary Retention, Urinating at Night and Weak urinary stream. Musculoskeletal Present- Joint Pain. Not Present- Back Pain, Joint Swelling, Morning Stiffness, Muscle Pain, Muscle Weakness and Spasms. Neurological Not Present- Blackout spells, Difficulty with balance, Dizziness, Paralysis, Tremor and Weakness. Psychiatric Not Present- Insomnia.  Vitals Height: 71in Pulse: 68 (Regular)  BP: 104/50 (Sitting, Right Arm, Standard)   Physical Exam  General Mental Status -Alert, cooperative and good historian. General  Appearance-pleasant, Not in acute distress. Orientation-Oriented X3. Build & Nutrition-Well nourished and Well developed.  Head and Neck Head-normocephalic, atraumatic . Neck Global Assessment - supple, no bruit auscultated on the right, no bruit auscultated on the left. Note: upper and lower dentures   Eye Vision -Note: wears glasses.  Pupil - Bilateral-Regular and Round. Motion - Bilateral-EOMI.  Chest and Lung Exam Auscultation Breath sounds - clear at anterior chest wall and clear at posterior chest wall. Adventitious sounds - No Adventitious sounds.  Cardiovascular Auscultation Rhythm - Regular rate and rhythm. Heart Sounds - S1 WNL and S2 WNL. Murmurs & Other Heart Sounds - Auscultation of the heart reveals - No Murmurs.  Abdomen Inspection Contour - Generalized mild distention. Palpation/Percussion Tenderness - Abdomen is non-tender to palpation. Rigidity (guarding) - Abdomen is soft. Auscultation Auscultation of the abdomen reveals - Bowel sounds normal.  Female Genitourinary Note: Not done, not pertinent to present illness   Musculoskeletal Note: On exam very pleasant well developed female alert and oriented in no apparent distress. She is in today in a motorized scooter. She has significant scarring along the posterolateral aspect of her left hip. It is a contracted scar. There is no sinus and no drainage. There is no warmth about it. She does not have any tenderness about the hip. There is no swelling noted. She is nontender about her replaced knee on that side. There is no instability noted.  RADIOGRAPHS AP of pelvis and lateral of the hip demonstrate an antibiotic spacer intact. Her femoral bone appears to be in excellent condition. She has slight displacement of her acetabulum, but the fear is that the original fracture is healed.   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 Lequita Halt.  Plan is to go to [facility]  PCP - [facility] Primary Care [facility] Hematology and Oncology - Dr. Thomasena Edis I.D. - Dr. Zorita Pang and Nobie Putnam, [facility]  The  patient does not have any contraindications and will receive TXA (tranexamic acid) prior to surgery.  Please note that at the time of her preop H&P, she has a pending CT scan of the left hip to be performed on Monday 08/20/2014.  Signed electronically by Lauraine RinneAlexzandrew L Adalei Novell, III PA-C

## 2014-08-24 ENCOUNTER — Inpatient Hospital Stay (HOSPITAL_COMMUNITY)
Admission: RE | Admit: 2014-08-24 | Discharge: 2014-08-29 | DRG: 940 | Disposition: A | Discharge status: Skilled Nursing Facility | Payer: Medicare Other | Source: Ambulatory Visit | Attending: Orthopedic Surgery | Admitting: Orthopedic Surgery

## 2014-08-24 ENCOUNTER — Inpatient Hospital Stay (HOSPITAL_COMMUNITY): Payer: Medicare Other | Admitting: Anesthesiology

## 2014-08-24 ENCOUNTER — Encounter (HOSPITAL_COMMUNITY): Payer: Medicare Other | Admitting: Anesthesiology

## 2014-08-24 ENCOUNTER — Encounter (HOSPITAL_COMMUNITY): Payer: Self-pay

## 2014-08-24 ENCOUNTER — Encounter (HOSPITAL_COMMUNITY)
Admission: RE | Disposition: A | Discharge status: Skilled Nursing Facility | Payer: Self-pay | Source: Ambulatory Visit | Attending: Orthopedic Surgery

## 2014-08-24 DIAGNOSIS — K219 Gastro-esophageal reflux disease without esophagitis: Secondary | ICD-10-CM | POA: Diagnosis present

## 2014-08-24 DIAGNOSIS — T8452XD Infection and inflammatory reaction due to internal left hip prosthesis, subsequent encounter: Secondary | ICD-10-CM | POA: Diagnosis not present

## 2014-08-24 DIAGNOSIS — Z792 Long term (current) use of antibiotics: Secondary | ICD-10-CM | POA: Diagnosis not present

## 2014-08-24 DIAGNOSIS — S7292XA Unspecified fracture of left femur, initial encounter for closed fracture: Secondary | ICD-10-CM

## 2014-08-24 DIAGNOSIS — M25552 Pain in left hip: Secondary | ICD-10-CM | POA: Diagnosis present

## 2014-08-24 DIAGNOSIS — Z87891 Personal history of nicotine dependence: Secondary | ICD-10-CM

## 2014-08-24 DIAGNOSIS — G629 Polyneuropathy, unspecified: Secondary | ICD-10-CM | POA: Diagnosis present

## 2014-08-24 DIAGNOSIS — M84352K Stress fracture, left femur, subsequent encounter for fracture with nonunion: Secondary | ICD-10-CM | POA: Diagnosis present

## 2014-08-24 DIAGNOSIS — K449 Diaphragmatic hernia without obstruction or gangrene: Secondary | ICD-10-CM | POA: Diagnosis present

## 2014-08-24 DIAGNOSIS — F419 Anxiety disorder, unspecified: Secondary | ICD-10-CM | POA: Diagnosis present

## 2014-08-24 DIAGNOSIS — Z9889 Other specified postprocedural states: Secondary | ICD-10-CM

## 2014-08-24 DIAGNOSIS — Y792 Prosthetic and other implants, materials and accessory orthopedic devices associated with adverse incidents: Secondary | ICD-10-CM | POA: Diagnosis present

## 2014-08-24 DIAGNOSIS — Z8781 Personal history of (healed) traumatic fracture: Secondary | ICD-10-CM

## 2014-08-24 DIAGNOSIS — Z96653 Presence of artificial knee joint, bilateral: Secondary | ICD-10-CM | POA: Diagnosis present

## 2014-08-24 DIAGNOSIS — Z96642 Presence of left artificial hip joint: Secondary | ICD-10-CM | POA: Diagnosis present

## 2014-08-24 DIAGNOSIS — A4902 Methicillin resistant Staphylococcus aureus infection, unspecified site: Secondary | ICD-10-CM

## 2014-08-24 DIAGNOSIS — T847XXA Infection and inflammatory reaction due to other internal orthopedic prosthetic devices, implants and grafts, initial encounter: Secondary | ICD-10-CM

## 2014-08-24 DIAGNOSIS — Z96649 Presence of unspecified artificial hip joint: Secondary | ICD-10-CM

## 2014-08-24 DIAGNOSIS — F329 Major depressive disorder, single episode, unspecified: Secondary | ICD-10-CM | POA: Diagnosis present

## 2014-08-24 DIAGNOSIS — Z6834 Body mass index (BMI) 34.0-34.9, adult: Secondary | ICD-10-CM | POA: Diagnosis not present

## 2014-08-24 DIAGNOSIS — Z113 Encounter for screening for infections with a predominantly sexual mode of transmission: Secondary | ICD-10-CM

## 2014-08-24 DIAGNOSIS — T8459XA Infection and inflammatory reaction due to other internal joint prosthesis, initial encounter: Secondary | ICD-10-CM

## 2014-08-24 DIAGNOSIS — M79651 Pain in right thigh: Secondary | ICD-10-CM

## 2014-08-24 DIAGNOSIS — B999 Unspecified infectious disease: Secondary | ICD-10-CM

## 2014-08-24 DIAGNOSIS — M009 Pyogenic arthritis, unspecified: Secondary | ICD-10-CM | POA: Diagnosis present

## 2014-08-24 HISTORY — PX: TOTAL HIP REVISION: SHX763

## 2014-08-24 LAB — POCT I-STAT EG7
Acid-Base Excess: 2 mmol/L (ref 0.0–2.0)
Acid-Base Excess: 1 mmol/L (ref 0.0–2.0)
BICARBONATE: 27.8 meq/L — AB (ref 20.0–24.0)
Bicarbonate: 26.8 mEq/L — ABNORMAL HIGH (ref 20.0–24.0)
CALCIUM ION: 1.22 mmol/L (ref 1.12–1.23)
Calcium, Ion: 1.19 mmol/L (ref 1.12–1.23)
HCT: 27 % — ABNORMAL LOW (ref 36.0–46.0)
HEMATOCRIT: 22 % — AB (ref 36.0–46.0)
Hemoglobin: 7.5 g/dL — ABNORMAL LOW (ref 12.0–15.0)
Hemoglobin: 9.2 g/dL — ABNORMAL LOW (ref 12.0–15.0)
O2 Saturation: 100 %
O2 Saturation: 100 %
PH VEN: 7.375 — AB (ref 7.250–7.300)
PO2 VEN: 295 mmHg — AB (ref 30.0–45.0)
Patient temperature: 36
Patient temperature: 35.6
Potassium: 4.1 mEq/L (ref 3.7–5.3)
Potassium: 4.6 mEq/L (ref 3.7–5.3)
SODIUM: 138 meq/L (ref 137–147)
SODIUM: 137 meq/L (ref 137–147)
TCO2: 29 mmol/L (ref 0–100)
TCO2: 28 mmol/L (ref 0–100)
pCO2, Ven: 47.8 mmHg (ref 45.0–50.0)
pCO2, Ven: 45.2 mmHg (ref 45.0–50.0)
pH, Ven: 7.368 — ABNORMAL HIGH (ref 7.250–7.300)
pO2, Ven: 281 mmHg — ABNORMAL HIGH (ref 30.0–45.0)

## 2014-08-24 LAB — GRAM STAIN

## 2014-08-24 LAB — CBC
HCT: 32.4 % — ABNORMAL LOW (ref 36.0–46.0)
Hemoglobin: 11.6 g/dL — ABNORMAL LOW (ref 12.0–15.0)
MCH: 30.9 pg (ref 26.0–34.0)
MCHC: 35.8 g/dL (ref 30.0–36.0)
MCV: 86.2 fL (ref 78.0–100.0)
Platelets: 100 10*3/uL — ABNORMAL LOW (ref 150–400)
RBC: 3.76 MIL/uL — AB (ref 3.87–5.11)
RDW: 13.4 % (ref 11.5–15.5)
WBC: 6.9 10*3/uL (ref 4.0–10.5)

## 2014-08-24 LAB — ABO/RH: ABO/RH(D): O POS

## 2014-08-24 LAB — PREPARE RBC (CROSSMATCH)

## 2014-08-24 SURGERY — TOTAL HIP REVISION
Anesthesia: General | Site: Hip | Laterality: Left

## 2014-08-24 MED ORDER — CHLORHEXIDINE GLUCONATE 4 % EX LIQD: 60.0000 mL | Freq: Once | CUTANEOUS | Status: DC

## 2014-08-24 MED ORDER — SODIUM CHLORIDE 0.9 % IV SOLN
650.0000 mg | Freq: Every day | INTRAVENOUS | Status: DC
  Administered 2014-08-24 – 2014-08-28 (×5): 650 mg via INTRAVENOUS
  Filled 2014-08-24 (×7): qty 13

## 2014-08-24 MED ORDER — SODIUM CHLORIDE 0.9 % IV SOLN: Freq: Once | INTRAVENOUS | Status: DC

## 2014-08-24 MED ORDER — TRAZODONE HCL 150 MG PO TABS
150.0000 mg | ORAL_TABLET | Freq: Every day | ORAL | Status: DC
  Administered 2014-08-24: 150 mg via ORAL
  Administered 2014-08-27: 300 mg via ORAL
  Administered 2014-08-28: 150 mg via ORAL
  Filled 2014-08-24 (×6): qty 2

## 2014-08-24 MED ORDER — FLEET ENEMA 7-19 GM/118ML RE ENEM: 1.0000 | ENEMA | Freq: Once | RECTAL | Status: AC | PRN

## 2014-08-24 MED ORDER — ACETAMINOPHEN 10 MG/ML IV SOLN
1000.0000 mg | Freq: Once | INTRAVENOUS | Status: AC
  Administered 2014-08-24: 1000 mg via INTRAVENOUS
  Filled 2014-08-24: qty 100

## 2014-08-24 MED ORDER — SUMATRIPTAN SUCCINATE 100 MG PO TABS
100.0000 mg | ORAL_TABLET | Freq: Once | ORAL | Status: DC
  Filled 2014-08-24: qty 1

## 2014-08-24 MED ORDER — CEFAZOLIN SODIUM-DEXTROSE 2-3 GM-% IV SOLR
2.0000 g | INTRAVENOUS | Status: AC
  Administered 2014-08-24: 2 g via INTRAVENOUS

## 2014-08-24 MED ORDER — SUMATRIPTAN SUCCINATE 50 MG PO TABS
100.0000 mg | ORAL_TABLET | ORAL | Status: DC | PRN
  Filled 2014-08-24: qty 2

## 2014-08-24 MED ORDER — ATROPINE SULFATE 0.4 MG/ML IJ SOLN
INTRAMUSCULAR | Status: AC
  Filled 2014-08-24: qty 1

## 2014-08-24 MED ORDER — ROCURONIUM BROMIDE 100 MG/10ML IV SOLN
INTRAVENOUS | Status: AC
  Filled 2014-08-24: qty 1

## 2014-08-24 MED ORDER — NALOXEGOL OXALATE 25 MG PO TABS
25.0000 mg | ORAL_TABLET | Freq: Every day | ORAL | Status: DC
  Administered 2014-08-24: 25 mg via ORAL
  Administered 2014-08-25: 21:00:00 via ORAL
  Administered 2014-08-26 – 2014-08-28 (×3): 25 mg via ORAL
  Filled 2014-08-24: qty 1

## 2014-08-24 MED ORDER — METOCLOPRAMIDE HCL 10 MG PO TABS: 5.0000 mg | ORAL_TABLET | Freq: Three times a day (TID) | ORAL | Status: DC | PRN

## 2014-08-24 MED ORDER — HYDROMORPHONE HCL 1 MG/ML IJ SOLN
INTRAMUSCULAR | Status: AC
  Filled 2014-08-24: qty 1

## 2014-08-24 MED ORDER — ROCURONIUM BROMIDE 100 MG/10ML IV SOLN
INTRAVENOUS | Status: DC | PRN
  Administered 2014-08-24 (×3): 10 mg via INTRAVENOUS
  Administered 2014-08-24: 50 mg via INTRAVENOUS
  Administered 2014-08-24: 10 mg via INTRAVENOUS

## 2014-08-24 MED ORDER — HYDROMORPHONE HCL 2 MG/ML IJ SOLN
INTRAMUSCULAR | Status: AC
  Filled 2014-08-24: qty 1

## 2014-08-24 MED ORDER — METHOCARBAMOL 500 MG PO TABS
500.0000 mg | ORAL_TABLET | Freq: Four times a day (QID) | ORAL | Status: DC | PRN
Start: 2014-08-24 — End: 2014-08-29
  Administered 2014-08-27: 500 mg via ORAL
  Filled 2014-08-24 (×2): qty 1

## 2014-08-24 MED ORDER — ONDANSETRON HCL 4 MG PO TABS: 4.0000 mg | ORAL_TABLET | Freq: Four times a day (QID) | ORAL | Status: DC | PRN

## 2014-08-24 MED ORDER — GLYCOPYRROLATE 0.2 MG/ML IJ SOLN
INTRAMUSCULAR | Status: AC
  Filled 2014-08-24: qty 3

## 2014-08-24 MED ORDER — SUCCINYLCHOLINE CHLORIDE 20 MG/ML IJ SOLN
INTRAMUSCULAR | Status: DC | PRN
  Administered 2014-08-24: 100 mg via INTRAVENOUS

## 2014-08-24 MED ORDER — ACETAMINOPHEN 650 MG RE SUPP: 650.0000 mg | Freq: Four times a day (QID) | RECTAL | Status: DC | PRN

## 2014-08-24 MED ORDER — GABAPENTIN 400 MG PO CAPS
400.0000 mg | ORAL_CAPSULE | Freq: Two times a day (BID) | ORAL | Status: DC
  Administered 2014-08-25 – 2014-08-29 (×9): 400 mg via ORAL
  Filled 2014-08-24 (×11): qty 1

## 2014-08-24 MED ORDER — SODIUM CHLORIDE 0.9 % IV SOLN
INTRAVENOUS | Status: DC
  Administered 2014-08-24: 13:00:00 via INTRAVENOUS

## 2014-08-24 MED ORDER — NEOSTIGMINE METHYLSULFATE 10 MG/10ML IV SOLN
INTRAVENOUS | Status: DC | PRN
  Administered 2014-08-24: 4 mg via INTRAVENOUS

## 2014-08-24 MED ORDER — MIDAZOLAM HCL 2 MG/2ML IJ SOLN
INTRAMUSCULAR | Status: AC
  Filled 2014-08-24: qty 2

## 2014-08-24 MED ORDER — BISACODYL 10 MG RE SUPP: 10.0000 mg | Freq: Every day | RECTAL | Status: DC | PRN

## 2014-08-24 MED ORDER — GLYCOPYRROLATE 0.2 MG/ML IJ SOLN
INTRAMUSCULAR | Status: DC | PRN
  Administered 2014-08-24: .8 mg via INTRAVENOUS

## 2014-08-24 MED ORDER — PHENOL 1.4 % MT LIQD: 1.0000 | OROMUCOSAL | Status: DC | PRN

## 2014-08-24 MED ORDER — LIDOCAINE HCL (CARDIAC) 20 MG/ML IV SOLN
INTRAVENOUS | Status: DC | PRN
  Administered 2014-08-24: 100 mg via INTRAVENOUS

## 2014-08-24 MED ORDER — DEXTROSE 5 % IV SOLN
500.0000 mg | Freq: Four times a day (QID) | INTRAVENOUS | Status: DC | PRN
  Administered 2014-08-24: 500 mg via INTRAVENOUS
  Filled 2014-08-24 (×2): qty 5

## 2014-08-24 MED ORDER — BUPIVACAINE LIPOSOME 1.3 % IJ SUSP
20.0000 mL | Freq: Once | INTRAMUSCULAR | Status: DC
  Filled 2014-08-24: qty 20

## 2014-08-24 MED ORDER — FENTANYL CITRATE 0.05 MG/ML IJ SOLN
INTRAMUSCULAR | Status: AC
  Filled 2014-08-24: qty 5

## 2014-08-24 MED ORDER — ONDANSETRON HCL 4 MG/2ML IJ SOLN
4.0000 mg | Freq: Four times a day (QID) | INTRAMUSCULAR | Status: DC | PRN
  Administered 2014-08-25: 4 mg via INTRAVENOUS
  Filled 2014-08-24: qty 2

## 2014-08-24 MED ORDER — EPHEDRINE SULFATE 50 MG/ML IJ SOLN
INTRAMUSCULAR | Status: AC
  Filled 2014-08-24: qty 1

## 2014-08-24 MED ORDER — PROMETHAZINE HCL 25 MG/ML IJ SOLN: 6.2500 mg | INTRAMUSCULAR | Status: DC | PRN

## 2014-08-24 MED ORDER — VANCOMYCIN HCL IN DEXTROSE 1-5 GM/200ML-% IV SOLN: 1000.0000 mg | Freq: Two times a day (BID) | INTRAVENOUS | Status: DC

## 2014-08-24 MED ORDER — ACETAMINOPHEN 500 MG PO TABS
1000.0000 mg | ORAL_TABLET | Freq: Four times a day (QID) | ORAL | Status: AC
  Administered 2014-08-24 – 2014-08-25 (×3): 1000 mg via ORAL
  Filled 2014-08-24 (×3): qty 2

## 2014-08-24 MED ORDER — NICOTINE POLACRILEX 4 MG MT LOZG
4.0000 mg | LOZENGE | OROMUCOSAL | Status: DC | PRN
  Filled 2014-08-24: qty 1

## 2014-08-24 MED ORDER — SODIUM CHLORIDE 0.9 % IV SOLN
INTRAVENOUS | Status: DC | PRN
  Administered 2014-08-24: 09:00:00 via INTRAVENOUS

## 2014-08-24 MED ORDER — DOCUSATE SODIUM 100 MG PO CAPS
100.0000 mg | ORAL_CAPSULE | Freq: Two times a day (BID) | ORAL | Status: DC
  Administered 2014-08-24 – 2014-08-29 (×10): 100 mg via ORAL

## 2014-08-24 MED ORDER — POLYETHYLENE GLYCOL 3350 17 G PO PACK: 17.0000 g | PACK | Freq: Every day | ORAL | Status: DC | PRN

## 2014-08-24 MED ORDER — MIDAZOLAM HCL 5 MG/5ML IJ SOLN
INTRAMUSCULAR | Status: DC | PRN
  Administered 2014-08-24: 2 mg via INTRAVENOUS

## 2014-08-24 MED ORDER — RIVAROXABAN 10 MG PO TABS
10.0000 mg | ORAL_TABLET | Freq: Every day | ORAL | Status: DC
  Administered 2014-08-25 – 2014-08-29 (×5): 10 mg via ORAL
  Filled 2014-08-24 (×6): qty 1

## 2014-08-24 MED ORDER — OXYMORPHONE HCL ER 20 MG PO TB12
20.0000 mg | ORAL_TABLET | Freq: Two times a day (BID) | ORAL | Status: DC
  Administered 2014-08-24 – 2014-08-29 (×10): 20 mg via ORAL
  Filled 2014-08-24 (×11): qty 1

## 2014-08-24 MED ORDER — HYDROMORPHONE HCL 1 MG/ML IJ SOLN
INTRAMUSCULAR | Status: DC | PRN
  Administered 2014-08-24 (×4): 0.5 mg via INTRAVENOUS
  Administered 2014-08-24 (×2): 1 mg via INTRAVENOUS

## 2014-08-24 MED ORDER — DEXAMETHASONE SODIUM PHOSPHATE 10 MG/ML IJ SOLN
INTRAMUSCULAR | Status: AC
  Filled 2014-08-24: qty 1

## 2014-08-24 MED ORDER — TRANEXAMIC ACID 100 MG/ML IV SOLN
1000.0000 mg | INTRAVENOUS | Status: AC
  Administered 2014-08-24: 1000 mg via INTRAVENOUS
  Filled 2014-08-24: qty 10

## 2014-08-24 MED ORDER — VANCOMYCIN HCL 10 G IV SOLR
2250.0000 mg | Freq: Once | INTRAVENOUS | Status: AC
  Administered 2014-08-24: 2250 mg via INTRAVENOUS
  Filled 2014-08-24: qty 2250

## 2014-08-24 MED ORDER — HYDROXYZINE HCL 25 MG PO TABS
25.0000 mg | ORAL_TABLET | Freq: Four times a day (QID) | ORAL | Status: DC | PRN
  Administered 2014-08-25 – 2014-08-29 (×10): 25 mg via ORAL
  Filled 2014-08-24 (×12): qty 1

## 2014-08-24 MED ORDER — PHENYLEPHRINE 40 MCG/ML (10ML) SYRINGE FOR IV PUSH (FOR BLOOD PRESSURE SUPPORT)
PREFILLED_SYRINGE | INTRAVENOUS | Status: AC
  Filled 2014-08-24: qty 10

## 2014-08-24 MED ORDER — NALOXEGOL OXALATE 25 MG PO TABS: 25.0000 mg | ORAL_TABLET | Freq: Every day | ORAL | Status: DC

## 2014-08-24 MED ORDER — HYDROMORPHONE HCL 1 MG/ML IJ SOLN
0.2500 mg | INTRAMUSCULAR | Status: DC | PRN
  Administered 2014-08-24: 0.25 mg via INTRAVENOUS
  Administered 2014-08-24: 0.5 mg via INTRAVENOUS

## 2014-08-24 MED ORDER — CEFAZOLIN SODIUM-DEXTROSE 2-3 GM-% IV SOLR
INTRAVENOUS | Status: AC
  Filled 2014-08-24: qty 50

## 2014-08-24 MED ORDER — MORPHINE SULFATE 2 MG/ML IJ SOLN
1.0000 mg | INTRAMUSCULAR | Status: DC | PRN
  Administered 2014-08-24: 2 mg via INTRAVENOUS
  Administered 2014-08-24 (×2): 1 mg via INTRAVENOUS
  Administered 2014-08-25 – 2014-08-29 (×19): 2 mg via INTRAVENOUS
  Filled 2014-08-24 (×22): qty 1

## 2014-08-24 MED ORDER — LACTATED RINGERS IV SOLN
INTRAVENOUS | Status: DC | PRN
  Administered 2014-08-24 (×2): via INTRAVENOUS

## 2014-08-24 MED ORDER — FUROSEMIDE 40 MG PO TABS
40.0000 mg | ORAL_TABLET | Freq: Two times a day (BID) | ORAL | Status: DC | PRN
  Filled 2014-08-24: qty 1

## 2014-08-24 MED ORDER — METHOCARBAMOL 500 MG PO TABS: 750.0000 mg | ORAL_TABLET | Freq: Two times a day (BID) | ORAL | Status: DC | PRN

## 2014-08-24 MED ORDER — SODIUM CHLORIDE 0.9 % IJ SOLN
INTRAMUSCULAR | Status: AC
  Filled 2014-08-24: qty 10

## 2014-08-24 MED ORDER — CITALOPRAM HYDROBROMIDE 40 MG PO TABS
40.0000 mg | ORAL_TABLET | Freq: Every morning | ORAL | Status: DC
  Administered 2014-08-25 – 2014-08-29 (×5): 40 mg via ORAL
  Filled 2014-08-24 (×6): qty 1

## 2014-08-24 MED ORDER — GLYCOPYRROLATE 0.2 MG/ML IJ SOLN
INTRAMUSCULAR | Status: AC
  Filled 2014-08-24: qty 1

## 2014-08-24 MED ORDER — NICOTINE POLACRILEX 2 MG MT GUM
2.0000 mg | CHEWING_GUM | OROMUCOSAL | Status: DC | PRN
  Filled 2014-08-24: qty 1

## 2014-08-24 MED ORDER — ACETAMINOPHEN 325 MG PO TABS
650.0000 mg | ORAL_TABLET | Freq: Four times a day (QID) | ORAL | Status: DC | PRN
Start: 2014-08-25 — End: 2014-08-29
  Administered 2014-08-29: 650 mg via ORAL
  Filled 2014-08-24: qty 2

## 2014-08-24 MED ORDER — BUPIVACAINE HCL (PF) 0.25 % IJ SOLN
INTRAMUSCULAR | Status: AC
  Filled 2014-08-24: qty 30

## 2014-08-24 MED ORDER — DEXAMETHASONE SODIUM PHOSPHATE 10 MG/ML IJ SOLN: 10.0000 mg | Freq: Once | INTRAMUSCULAR | Status: DC

## 2014-08-24 MED ORDER — PROPOFOL 10 MG/ML IV BOLUS
INTRAVENOUS | Status: DC | PRN
  Administered 2014-08-24: 200 mg via INTRAVENOUS

## 2014-08-24 MED ORDER — GABAPENTIN 400 MG PO CAPS
800.0000 mg | ORAL_CAPSULE | Freq: Every day | ORAL | Status: DC
  Administered 2014-08-24 – 2014-08-28 (×5): 800 mg via ORAL
  Filled 2014-08-24 (×6): qty 2

## 2014-08-24 MED ORDER — OXYCODONE HCL 5 MG PO TABS
10.0000 mg | ORAL_TABLET | ORAL | Status: DC | PRN
  Administered 2014-08-24 – 2014-08-25 (×2): 10 mg via ORAL
  Administered 2014-08-25: 20 mg via ORAL
  Administered 2014-08-25 (×3): 10 mg via ORAL
  Administered 2014-08-26 (×4): 20 mg via ORAL
  Administered 2014-08-27 (×2): 10 mg via ORAL
  Administered 2014-08-27 – 2014-08-28 (×6): 20 mg via ORAL
  Administered 2014-08-28 (×2): 10 mg via ORAL
  Administered 2014-08-28 – 2014-08-29 (×5): 20 mg via ORAL
  Filled 2014-08-24 (×2): qty 4
  Filled 2014-08-24: qty 2
  Filled 2014-08-24: qty 4
  Filled 2014-08-24: qty 2
  Filled 2014-08-24 (×3): qty 4
  Filled 2014-08-24 (×2): qty 2
  Filled 2014-08-24: qty 4
  Filled 2014-08-24: qty 2
  Filled 2014-08-24 (×2): qty 4
  Filled 2014-08-24: qty 3
  Filled 2014-08-24 (×2): qty 2
  Filled 2014-08-24 (×2): qty 4
  Filled 2014-08-24: qty 3
  Filled 2014-08-24 (×2): qty 4
  Filled 2014-08-24 (×2): qty 2
  Filled 2014-08-24 (×2): qty 4
  Filled 2014-08-24: qty 2
  Filled 2014-08-24 (×3): qty 4

## 2014-08-24 MED ORDER — LIDOCAINE HCL (CARDIAC) 20 MG/ML IV SOLN
INTRAVENOUS | Status: AC
  Filled 2014-08-24: qty 5

## 2014-08-24 MED ORDER — MENTHOL 3 MG MT LOZG: 1.0000 | LOZENGE | OROMUCOSAL | Status: DC | PRN

## 2014-08-24 MED ORDER — FENTANYL CITRATE 0.05 MG/ML IJ SOLN
INTRAMUSCULAR | Status: DC | PRN
  Administered 2014-08-24 (×10): 50 ug via INTRAVENOUS

## 2014-08-24 MED ORDER — SODIUM CHLORIDE 0.9 % IJ SOLN
INTRAMUSCULAR | Status: AC
  Filled 2014-08-24: qty 50

## 2014-08-24 MED ORDER — OXYBUTYNIN CHLORIDE 5 MG PO TABS
5.0000 mg | ORAL_TABLET | Freq: Three times a day (TID) | ORAL | Status: DC | PRN
  Administered 2014-08-26 – 2014-08-28 (×4): 5 mg via ORAL
  Filled 2014-08-24 (×6): qty 1

## 2014-08-24 MED ORDER — ONDANSETRON HCL 4 MG/2ML IJ SOLN
INTRAMUSCULAR | Status: DC | PRN
  Administered 2014-08-24: 4 mg via INTRAVENOUS

## 2014-08-24 MED ORDER — SODIUM CHLORIDE 0.9 % IV SOLN
INTRAVENOUS | Status: DC
  Administered 2014-08-25 – 2014-08-28 (×3): via INTRAVENOUS

## 2014-08-24 MED ORDER — DEXAMETHASONE SODIUM PHOSPHATE 10 MG/ML IJ SOLN
INTRAMUSCULAR | Status: DC | PRN
  Administered 2014-08-24: 10 mg via INTRAVENOUS

## 2014-08-24 MED ORDER — PROPOFOL 10 MG/ML IV BOLUS
INTRAVENOUS | Status: AC
  Filled 2014-08-24: qty 20

## 2014-08-24 MED ORDER — DIPHENHYDRAMINE HCL 12.5 MG/5ML PO ELIX: 12.5000 mg | ORAL_SOLUTION | ORAL | Status: DC | PRN

## 2014-08-24 MED ORDER — VANCOMYCIN HCL 1000 MG IV SOLR
INTRAVENOUS | Status: AC
  Filled 2014-08-24: qty 3000

## 2014-08-24 MED ORDER — 0.9 % SODIUM CHLORIDE (POUR BTL) OPTIME
TOPICAL | Status: DC | PRN
  Administered 2014-08-24: 1000 mL

## 2014-08-24 MED ORDER — DEXAMETHASONE SODIUM PHOSPHATE 10 MG/ML IJ SOLN
10.0000 mg | Freq: Once | INTRAMUSCULAR | Status: AC
  Administered 2014-08-25: 10 mg via INTRAVENOUS
  Filled 2014-08-24: qty 1

## 2014-08-24 MED ORDER — SODIUM CHLORIDE 0.9 % IR SOLN
Status: DC | PRN
  Administered 2014-08-24: 3000 mL

## 2014-08-24 MED ORDER — PANTOPRAZOLE SODIUM 40 MG PO TBEC
40.0000 mg | DELAYED_RELEASE_TABLET | Freq: Every day | ORAL | Status: DC
  Administered 2014-08-25 – 2014-08-29 (×5): 40 mg via ORAL
  Filled 2014-08-24 (×7): qty 1

## 2014-08-24 MED ORDER — ONDANSETRON HCL 4 MG/2ML IJ SOLN
INTRAMUSCULAR | Status: AC
  Filled 2014-08-24: qty 2

## 2014-08-24 MED ORDER — LACTATED RINGERS IV SOLN
INTRAVENOUS | Status: DC | PRN
  Administered 2014-08-24 (×2): via INTRAVENOUS

## 2014-08-24 MED ORDER — NEOSTIGMINE METHYLSULFATE 10 MG/10ML IV SOLN
INTRAVENOUS | Status: AC
  Filled 2014-08-24: qty 1

## 2014-08-24 MED ORDER — LACTATED RINGERS IV SOLN: INTRAVENOUS | Status: DC

## 2014-08-24 MED ORDER — METOCLOPRAMIDE HCL 5 MG/ML IJ SOLN: 5.0000 mg | Freq: Three times a day (TID) | INTRAMUSCULAR | Status: DC | PRN

## 2014-08-24 SURGICAL SUPPLY — 72 items
BAG ZIPLOCK 12X15 (MISCELLANEOUS) ×9 IMPLANT
BIT DRILL 145X3.2XQCK CNCT (BIT) ×1 IMPLANT
BIT DRILL 2.8X128 (BIT) ×2 IMPLANT
BIT DRILL 2.8X128MM (BIT) ×1
BIT DRL 145X3.2XQCK CNCT (BIT) ×1
BLADE EXTENDED COATED 6.5IN (ELECTRODE) ×3 IMPLANT
BLADE SAW SAG 73X25 THK (BLADE) ×2
BLADE SAW SGTL 73X25 THK (BLADE) ×1 IMPLANT
BONE CEMENT GENTAMICIN (Cement) ×9 IMPLANT
BOWL SMART MIX CTS (DISPOSABLE) ×6 IMPLANT
CABLE (Orthopedic Implant) ×6 IMPLANT
CABLE FOR BONE PLATE (Cable) ×6 IMPLANT
CEMENT BONE GENTAMICIN 40 (Cement) ×3 IMPLANT
CLOSURE WOUND 1/2 X4 (GAUZE/BANDAGES/DRESSINGS) ×1
DRAPE INCISE IOBAN 66X45 STRL (DRAPES) ×3 IMPLANT
DRAPE POUCH INSTRU U-SHP 10X18 (DRAPES) ×3 IMPLANT
DRAPE SPLIT 77X100IN (DRAPES) ×6 IMPLANT
DRAPE U-SHAPE 47X51 STRL (DRAPES) ×3 IMPLANT
DRILL BIT QC 3.2X145 (BIT) ×2
DRSG EMULSION OIL 3X16 NADH (GAUZE/BANDAGES/DRESSINGS) ×3 IMPLANT
DRSG MEPILEX BORDER 4X4 (GAUZE/BANDAGES/DRESSINGS) ×3 IMPLANT
DRSG MEPILEX BORDER 4X8 (GAUZE/BANDAGES/DRESSINGS) ×3 IMPLANT
DURAPREP 26ML APPLICATOR (WOUND CARE) ×3 IMPLANT
ELECT REM PT RETURN 9FT ADLT (ELECTROSURGICAL) ×3
ELECTRODE REM PT RTRN 9FT ADLT (ELECTROSURGICAL) ×1 IMPLANT
EVACUATOR 1/8 PVC DRAIN (DRAIN) ×3 IMPLANT
FACESHIELD WRAPAROUND (MASK) ×12 IMPLANT
GAUZE SPONGE 4X4 12PLY STRL (GAUZE/BANDAGES/DRESSINGS) ×3 IMPLANT
GLOVE BIO SURGEON STRL SZ7.5 (GLOVE) ×3 IMPLANT
GLOVE BIO SURGEON STRL SZ8 (GLOVE) ×3 IMPLANT
GLOVE BIOGEL PI IND STRL 8 (GLOVE) ×3 IMPLANT
GLOVE BIOGEL PI INDICATOR 8 (GLOVE) ×6
GLOVE SURG SS PI 6.5 STRL IVOR (GLOVE) ×6 IMPLANT
GOWN STRL REUS W/TWL LRG LVL3 (GOWN DISPOSABLE) ×6 IMPLANT
GOWN STRL REUS W/TWL XL LVL3 (GOWN DISPOSABLE) ×3 IMPLANT
HANDPIECE INTERPULSE COAX TIP (DISPOSABLE) ×2
HEAD FEM STD 32X+1 STRL (Hips) ×3 IMPLANT
IMMOBILIZER KNEE 20 (SOFTGOODS) ×3 IMPLANT
IMMOBILIZER KNEE 20 THIGH 36 (SOFTGOODS) IMPLANT
KIT BASIN OR (CUSTOM PROCEDURE TRAY) ×3 IMPLANT
LINER ACET CUP 42MMX32MM (Hips) ×3 IMPLANT
MANIFOLD NEPTUNE II (INSTRUMENTS) ×3 IMPLANT
NDL SAFETY ECLIPSE 18X1.5 (NEEDLE) IMPLANT
NEEDLE HYPO 18GX1.5 SHARP (NEEDLE)
NS IRRIG 1000ML POUR BTL (IV SOLUTION) ×3 IMPLANT
PACK TOTAL JOINT (CUSTOM PROCEDURE TRAY) ×3 IMPLANT
PADDING CAST COTTON 6X4 STRL (CAST SUPPLIES) ×3 IMPLANT
PASSER SUT SWANSON 36MM LOOP (INSTRUMENTS) ×3 IMPLANT
PLATE CABLE BONE 6HOLE (Plate) ×3 IMPLANT
POSITIONER SURGICAL ARM (MISCELLANEOUS) ×6 IMPLANT
SCREW CORT 3.5X38X4.5XST LG (Screw) ×4 IMPLANT
SCREW CORTICAL 4.5X140MM (Screw) ×6 IMPLANT
SCREW CORTICAL 4.5X38MM (Screw) ×8 IMPLANT
SCREW CORTICAL SHAFT 38MM (Screw) ×3 IMPLANT
SET HNDPC FAN SPRY TIP SCT (DISPOSABLE) ×1 IMPLANT
SPONGE LAP 18X18 X RAY DECT (DISPOSABLE) ×24 IMPLANT
STAPLER VISISTAT 35W (STAPLE) ×6 IMPLANT
STRIP CLOSURE SKIN 1/2X4 (GAUZE/BANDAGES/DRESSINGS) ×2 IMPLANT
SUCTION FRAZIER TIP 10 FR DISP (SUCTIONS) ×3 IMPLANT
SUMMIT FEMORAL STEM (Hips) ×3 IMPLANT
SUT ETHIBOND NAB CT1 #1 30IN (SUTURE) ×6 IMPLANT
SUT VIC AB 1 CT1 27 (SUTURE) ×6
SUT VIC AB 1 CT1 27XBRD ANTBC (SUTURE) ×3 IMPLANT
SUT VIC AB 2-0 CT1 27 (SUTURE) ×10
SUT VIC AB 2-0 CT1 TAPERPNT 27 (SUTURE) ×5 IMPLANT
SUT VLOC 180 0 24IN GS25 (SUTURE) ×12 IMPLANT
SWAB COLLECTION DEVICE MRSA (MISCELLANEOUS) ×3 IMPLANT
SYR 50ML LL SCALE MARK (SYRINGE) IMPLANT
TOWEL OR 17X26 10 PK STRL BLUE (TOWEL DISPOSABLE) ×6 IMPLANT
TRAY FOLEY CATH 14FRSI W/METER (CATHETERS) ×3 IMPLANT
TUBE ANAEROBIC SPECIMEN COL (MISCELLANEOUS) ×3 IMPLANT
WATER STERILE IRR 1500ML POUR (IV SOLUTION) ×3 IMPLANT

## 2014-08-24 NOTE — Progress Notes (Signed)
Attempted to insert PICC x 2.  Each time gain access to vein but unable to thread guide wire fully.  Py very anxious and hyperventilating.  Procedure stopped at Pt's request.

## 2014-08-24 NOTE — Progress Notes (Signed)
Clinical Social Work Department BRIEF PSYCHOSOCIAL ASSESSMENT 08/24/2014  Patient:  [facility]     Account Number:  [id number]     Admit date:  08/24/2014  Clinical Social Worker:  Lacie Scotts  Date/Time:  08/24/2014 02:32 PM  Referred by:  Physician  Date Referred:  08/24/2014 Referred for  SNF Placement   Other Referral:   Interview type:   Other interview type:    PSYCHOSOCIAL DATA Living Status:  HUSBAND Admitted from facility:   Level of care:   Primary support name:  Coralyn Mark Primary support relationship to patient:  SPOUSE Degree of support available:   supportive    CURRENT CONCERNS Current Concerns  Post-Acute Placement   Other Concerns:    SOCIAL WORK ASSESSMENT / PLAN Pt is a 58 yr old female living at home prior to hospitalization. CSW consulted to assist with d/c planning. This is a planned admission. CSW met with pt's sister. Pt was sleeping having recently arrived to [place] following surgery. Pt has made prior arrangements to have ST rehab at [facility] following hospital d/c. CSW has contacted SNF and d/c plans have been confirmed. MD feels the earliest pt will be ready for d/c is Monday.   Assessment/plan status:  Psychosocial Support/Ongoing Assessment of Needs Other assessment/ plan:   Information/referral to community resources:   None needed at this time.    PATIENT'S/FAMILY'S RESPONSE TO PLAN OF CARE: Pt / sister toured [facility] prior to hospitalization and were pleased with facility. Sister states pt will be disappointed that she was unable to have her  hip replaced during this surgery. CSW will offer ongoing support.    Werner Lean LCSW [phone]

## 2014-08-24 NOTE — Transfer of Care (Signed)
Immediate Anesthesia Transfer of Care Note  Patient: [name]  Procedure(s) Performed: Procedure(s) (LRB): INCISION AND DRAINAGE LEFT HIP, EXCHANGE ANTIBIOTIC SPACER, OPEN REDUCTION LEFT FEMUR FRACTURE  (Left)  Patient Location: PACU  Anesthesia Type: General  Level of Consciousness: sedated, patient cooperative and responds to stimulation  Airway & Oxygen Therapy: Patient Spontanous Breathing and Patient connected to face mask oxgen  Post-op Assessment: Report given to PACU RN and Post -op Vital signs reviewed and stable  Post vital signs: Reviewed and stable  Complications: No apparent anesthesia complications

## 2014-08-24 NOTE — Op Note (Signed)
NAMReine Graham:  Wilmarth, Graham            ACCOUNT NO.:  [id number]  MEDICAL RECORD NO.:  [id number]  LOCATION:  1612                         FACILITY[facility]  [facility]  PHYSICIAN:  Ollen GrossFrank Kimmy Totten, M.D.    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  PROCEDURE: 1. Irrigation and debridement in left hip with exchange of antibiotic     spacer. 2. Open reduction internal fixation, left femur fracture.  SURGEON:  Ollen GrossFrank Itzell Bendavid, M.D.  ASSISTANT:  Alexzandrew L. Perkins, P.A.C.  ANESTHESIA:  General.  ESTIMATED BLOOD LOSS:  2400.  DRAIN:  Hemovac x1.  COMPLICATIONS:  Intraoperative left femur fracture.  CONDITION:  Stable to recovery.  BRIEF CLINICAL NOTE:  [name] is a 58 year old female, long complex history in regards to her left hip.  She has had multiple surgeries at different institutions.  She most recently had a resection arthroplasty, antibiotic spacer from Dr. Annette StableBill Ward [facility] [place]  She had been cleared by Infectious Disease for reimplantation and presented to us on second opinion for consideration of reimplantation.  She presents today for possible reimplantation of her left total hip arthroplasty.  PROCEDURE IN DETAIL:  After successful administration of general anesthetic, the patient was placed in the right lateral decubitus position with the left side up and held with the hip positioner.  The left lower extremity was isolated from her perineum of plastic drapes, and prepped and draped in the usual sterile fashion.  Posterolateral incision was made with a 10 blade of the subcutaneous tissue.  In the subcu tissue, it was noted that she had a fair amount of oozing as well as a very inflamed  appearing tissue.  When we cut down through the fascia lata, I encountered seropurulent looking fluid.  It was sent for stat Gram stain, which showed moderate white cells, but no organisms, but it really had a seropurulent appearance.  When I entered the joint, we noted that there was a tremendous amount of very inflamed tissue and the tissue itself did not have a normal appearance.  Once we entered the joint, I was able to dislocate the hip and then remove the antibiotic spacer.  We then achieved acetabular exposure.  Acetabular retractors are placed.  Also a tremendous amount of inflamed tissue in the acetabulum.  This was removed with rongeur.  I used a reamer to get down to a healthy-appearing bone.  With this point, I had decided that given the seropurulent fluid and appearance of the tissue that the infection was still present, then we are going to exchange the spacer and not place a new total hip.  We then addressed the femur.  There is also a tremendous amount of inflamed tissue around the femur, which was debrided.  I removed the tissue from the femoral canal.  We then reamed to remove any other fibrinous tissue from the canal.  We then thoroughly irrigated the canal as well as the acetabulum with pulsatile lavage with normal saline.  Two batches of gentamicin impregnated cement mixed with 2 g of vancomycin was then mixed.  I dried out the acetabular bed and then cemented the Prostalac acetabular liner, which is a 42 liner with a 32 inner diameter.  It is placed in about 40 degrees of abduction and 20 degrees of anteversion.  It was held in position with a hip positioner until the cement is hard.  At that time, I also coded a size 3 Summit cemented stem, with the antibiotic cement to create the cement spacer with the metal endoskeleton.  We then exposed the proximal femur again.  At this point, upon rotating the femur, it is noted that the femurs is not rotating as a single  unit. We were still able to address the femur proximally and the another batch of the antibiotic cement, gentamicin, and 1 g of vancomycin is mixed. We then cemented the spacer proximally into the femur in about 20 degrees of anteversion.  I then had to extend the incision distally all the way down to the supracondylar area.  A fresh blade was used to cut the skin through subcutaneous tissue to the fascia lata, which was incised in line with the skin incision.  The fascia of vastus lateralis was incised, the muscle was elevated off the intermuscular septum, and the fracture identified.  It was a long oblique fracture.  Of note, interestingly enough, there was a lot of old hematoma present all around this area, consistent with her probably having a stress fracture previously in this area that had incompletely healed.  We evacuated the old hematoma and then thoroughly irrigated and there was hematoma around the fracture edges also.  I then reduced the fracture with reduction clamps and placed 2 Zimmer cables around the fracture to hold it reduced.  The cables were tightened and then the clamp removed and the fracture was held reduced.  We then placed a 6-hole Zimmer cable plate over the lateral cortex of the femur.  I then placed 2 cables and 1 screw proximal to the fracture, 2 screws through the fracture to capture both fragments and serve as interfragmentary screws and then 3 screws distal to the fracture.  This is very stable reduction.  We were able to place the femur through a range of motion and then move solitary as 1 unit.  We then placed a 32 mm femoral head onto the femoral neck and reduced into the Prostalac liner, snapping it in place, and essentially serving as a constrained system.  The wound was then thoroughly irrigated with 3 L of saline using pulsatile lavage.  The fascia of the vastus lateralis was closed with a running #1 V-Loc suture.  Fascia lata was closed over  Hemovac drain with 2 #1 V-Loc sutures.  Subcu was deep closed with another V-Loc suture.  Subcu superficially closed with interrupted 2-0 Vicryl and skin closed with staples.  Drains hooked to suction.  Incision cleaned and dried and a bulky sterile dressing applied.  She was then awakened and transported to recovery in stable condition.     Ollen GrossFrank Hadden Steig, M.D.     FA/MEDQ  D:  08/24/2014  T:  08/24/2014  Job:  [id number]

## 2014-08-24 NOTE — Progress Notes (Signed)
ANTIBIOTIC CONSULT NOTE - INITIAL  Pharmacy Consult for Vancomycin Indication: Septic arthritis hip  No Known Allergies  Patient Measurements: Height: 5\' 10"  (177.8 cm) Weight: 239 lb (108.41 kg) IBW/kg (Calculated) : 68.5  Vital Signs: Temp: 97.6 F (36.4 C) (10/30 1215) Temp Source: Oral (10/30 0544) BP: 152/79 mmHg (10/30 1215) Pulse Rate: 97 (10/30 1200) Intake/Output from previous day:   Intake/Output from this shift: Total I/O In: 5005 [I.V.:4000; Blood:1005] Out: 2750 [Urine:675; Drains:75; Blood:2000]  Labs:  Recent Labs  08/24/14 0921 08/24/14 1022  HGB 7.5* 9.2*   Estimated Creatinine Clearance: 98.6 ml/min (by C-G formula based on Cr of 0.84). No results found for this basename: VANCOTROUGH, VANCOPEAK, VANCORANDOM, GENTTROUGH, GENTPEAK, GENTRANDOM, TOBRATROUGH, TOBRAPEAK, TOBRARND, AMIKACINPEAK, AMIKACINTROU, AMIKACIN,  in the last 72 hours    Medical History: Past Medical History  Diagnosis Date  . Tenosynovitis of fingers     RIGHT RING AND SMALL-HAD SURGERY 07/12/14 [facility]  . Bronchitis     dx  07-04-2014 (approx) will finished antibiotic 07-14-2014  . History of concussion     x4  --  no residual  . Migraines   . Depression   . 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 GERD (gastroesophageal reflux disease)   . H/O hiatal hernia   . Chronic constipation   . Chronic narcotic use   . Nocturia   . SUI (stress urinary incontinence, female)   . OA (osteoarthritis)     hips, knees, hips  . Infection of left prosthetic hip joint     left total arthroplasty done 2012/  jan 2015 removal of prosthesis  . Full dentures   . History of cellulitis     lower extremities  . Thinning of skin     easily tears  . Venous stasis dermatitis     BILATERAL LEGS  . Productive cough     RESOLVED - QUIT SMOKING  . Memory problem   . OSA on CPAP     study x3    last one few yrs ago  cpap recommended -- DOES NOT USE OR HAVE CPAP    Assessment: [age] yoF with complications of internal orthopedic device of left hip (including multiple surgeries and prolonged courses of antibiotics since 2011) now s/p left total hip reimplantation 10/30.  Pharmacy consulted to dose vancomycin post-op for septic arthritis hip.  Patient received Ancef 2g pre-op.  Pre-op surgical screen was negative.  Tmax: afebrile WBC: ordered for AM Renal: SCr 0.84 10/23, CrCl~83 ml/min (normalized) Fluid from left hip sent for culture.  Gram stain shows moderate WBC.  Goal of Therapy:  Vancomycin trough level 15-20 mcg/ml Doses adjusted per renal function Eradication of infection  Plan:  1.  Vancomycin 2250 mg IV x 1 loading dose.   2.  Then Vancomycin 1g IV q12h. 3.  F/u SCr, trough level, culture results.  [name], [name] 08/24/2014,1:13 PM

## 2014-08-24 NOTE — Consult Note (Addendum)
[facility]    Date of Admission:  08/24/2014  Date of Consult:  08/24/2014  Reason for Consult: infected prosthetic hip with potential osteomyelitis  Referring Physician: Dr Despina Hick   HPI: [name] is an 58 y.o. female. with history of left total hip arthroplasty revision in 10/31/13 at [facility], with original THA in 11/2010 at [facility].  Prior to this had ORIF in 09/11/10 due to acetabular fracture with non-union and movement of screws (done at [facility]).   She was admitted to [facility]  on 12/05/13 and had fever to 103 with WBC count of 13,800. CT of the joint showed 8.1 x 5.5 cm fluid collection in the subcutaneous tissue of the left lateral thigh that appears to communicate with a 7.4 x 3.2 cm collection posterior and lateral to the proximal femur.   She underwent CT guided aspiration of the joint , but no joint fluid was able to be aspirated. She went back to the OR with Dr. Elesa Massed  on 12/06/13 for formal I&D. Dark fluid with purulent debris was found deep in the wound bed and also communicated with the joint. Fluid cultures and deep cultures were sent and grew MRSA.  She was taken back to the OR on 12/14/13 for joint explantation. Purulence and debris were noted in the posterior joint. The femoral and acetabular components of the prosthesis were removed and an antibiotic spacer was placed. Culture from 12/14/13 was negative.  Repeat CT of the hip on 3/2 showed small fluid collection. Drain was placed on 3/3 and 100 ccs drained. Vancomycin was changed to daptomycin on 3/4 due to poor renal clearance and the possibility of vanco-induced leukopenia.   Repeat CT 12/31/13 with increased size and organization of a rim-enhancing fluid collection in the lateral soft tissues consistent with abscess. On 01/01/14 she underwent new drain placement into periprosthetic collection, the old drain was removed. Cultures were negative. Drain was removed on 3/17 and she was  discharged to home on 3/21 on daptomycin.   There was an issue with the cost of daptomycin, but 9 doses were already delivered to her house, so the plan by ID at [facility] was to use those and sort our what to do next in follow up. They then set her up for Dalbivancin which she has had 3 doses of bringing her to 9 weeks of IV therapy which finished up on 02/06/2014. She had been on oral continuation with bactrim. Sed rate finally normalized during visit in June of 2015. Dr. Jinger Neighbors (ID at [facility]) had been following the patient and per her most recent notes from July she had indicated that the Orthopedist Dr. Elesa Massed had wished for the pt to have remained on oral antibiotics for a minimum of a year prior to consideration of hip reimplantation. There was even mention in some of the notes that there was contemplation being given for potential "Girdlestone" procedure being potentially needed to cure pts infection. I cannot readily find documentation that this patient was "cleared" for surgery by her ID MD. The ID MD's note does mention that the patient disagreed with the idea of a year minimum of antibiotics and that she was seeking a 2nd opinion. Regardless pt then apparently took herself off of bactrim.   She presented to Dr. Lequita Halt for 2nd opinion re possible hip reimplantation. He took her to the OR today and encountered sero-purulent fluid superficially. He was able to remove the antibiotic spacer but found inflamed tissue  in the joint esp the acetabulum, as well as around the femur that was irrigated. Gentamicin cement and vancomycin were placed. Additionally she was found to have old hematoma and fracture in femur which was treated with ORIF.  Cultures were taken from OR  And pt had been given ancef preoperatively and now vancomycin postoperatively.        Past Medical History  Diagnosis Date  . Tenosynovitis of fingers     RIGHT RING AND SMALL-HAD SURGERY 07/12/14 [facility]  .  Bronchitis     dx  07-04-2014 (approx) will finished antibiotic 07-14-2014  . History of concussion     x4  --  no residual  . Migraines   . Depression   . 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 GERD (gastroesophageal reflux disease)   . H/O hiatal hernia   . Chronic constipation   . Chronic narcotic use   . Nocturia   . SUI (stress urinary incontinence, female)   . OA (osteoarthritis)     hips, knees, hips  . Infection of left prosthetic hip joint     left total arthroplasty done 2012/  jan 2015 removal of prosthesis  . Full dentures   . History of cellulitis     lower extremities  . Thinning of skin     easily tears  . Venous stasis dermatitis     BILATERAL LEGS  . Productive cough     RESOLVED - QUIT SMOKING  . Memory problem   . OSA on CPAP     study x3   last one few yrs ago  cpap recommended -- DOES NOT USE OR HAVE CPAP    Past Surgical History  Procedure Laterality Date  . Appendectomy  19 (age 15)  . 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 Orif left foot fx  1990's  . Orif right tibial fx  2008    hardware removed same year  . Open repair and fixation right tibia and knee  X2  2009  . Total knee arthroplasty Right X2  2009  &  2010  . Total knee arthroplasty Left 2009  . Knee arthroscopy Left 2009  . Orif pelvic fx's  11 /2011  . Total hip arthroplasty Left 2012  . Revision total hip arthroplasty Left 01/ 2015  . Removal prothesis  left hip arthroplasty  02/ 2015    INFECTION  . Laparoscopic cholecystectomy  08-12-2003  . Tenosynovectomy Right 07/12/2014    Procedure: RIGHT RING/SMALL FINGER TENOSYNOVECTOMY;  Surgeon: Sharma Covert, MD;  Location: [facility];  Service: Orthopedics;  Laterality: Right;  ergies:   No Known Allergies   Medications: I have reviewed patients  current medications as documented in Epic Anti-infectives   Start     Dose/Rate Route Frequency Ordered Stop   08/25/14 0200  vancomycin (VANCOCIN) IVPB 1000 mg/200 mL premix     1,000 mg 200 mL/hr over 60 Minutes Intravenous Every 12 hours 08/24/14 1329     08/24/14 1400  vancomycin (VANCOCIN) 2,250 mg in sodium chloride 0.9 % 500 mL IVPB     2,250 mg 250 mL/hr over 120 Minutes Intravenous  Once 08/24/14 1329 08/24/14 1700   08/24/14 0616  ceFAZolin (ANCEF) IVPB 2  g/50 mL premix     2 g 100 mL/hr over 30 Minutes Intravenous On call to O.R. 08/24/14 0616 08/24/14 0740      Social History:  reports that she quit smoking about 6 weeks ago. Her smoking use included Cigarettes. She has a 30 pack-year smoking history. She has never used smokeless tobacco. She reports that she does not drink alcohol or use illicit drugs.  History reviewed. No pertinent family history.  As in HPI and primary teams notes otherwise 12 point review of systems is negative  Blood pressure 162/85, pulse 99, temperature 97.5 F (36.4 C), temperature source Oral, resp. rate 16, height 5\' 10"  (1.778 m), weight 239 lb (108.41 kg), SpO2 96.00%. General: Alert and awake, dysphoric and tearful, about to have PICC line inserted HEENT: anicteric sclera EOMI, oropharynx clear and without exudate CVS tachy rate, normal Chest: , no wheezing, resp distress Abdomen: soft nontender, nondistended, normal bowel sounds, Extremities: dressings in place Neuro: nonfocal  Results for orders placed during the hospital encounter of 08/24/14 (from the past 48 hour(s))  ABO/RH     Status: None   Collection Time    08/24/14  6:30 AM      Result Value Ref Range   ABO/RH(D) O POS    TYPE AND SCREEN     Status: None   Collection Time    08/24/14  6:45 AM      Result Value Ref Range   ABO/RH(D) O POS     Antibody Screen POS     Sample Expiration 08/27/2014     DAT, IgG NEG     Antibody Identification ANTI E     PT AG Type NEGATIVE  FOR E ANTIGEN     Unit Number [id number]     Blood Component Type RED CELLS,LR     Unit division 00     Status of Unit ISSUED     Donor AG Type NEGATIVE FOR E ANTIGEN     Transfusion Status OK TO TRANSFUSE     Crossmatch Result COMPATIBLE     Unit Number [id number]     Blood Component Type RED CELLS,LR     Unit division 00     Status of Unit ISSUED     Donor AG Type NEGATIVE FOR E ANTIGEN     Transfusion Status OK TO TRANSFUSE     Crossmatch Result COMPATIBLE     Unit Number [id number]     Blood Component Type RED CELLS,LR     Unit division 00     Status of Unit ALLOCATED     Donor AG Type NEGATIVE FOR E ANTIGEN     Transfusion Status OK TO TRANSFUSE     Crossmatch Result COMPATIBLE     Unit Number [id number]     Blood Component Type RED CELLS,LR     Unit division 00     Status of Unit ISSUED     Donor AG Type NEGATIVE FOR E ANTIGEN     Transfusion Status OK TO TRANSFUSE     Crossmatch Result COMPATIBLE     Unit Number [id number]     Blood Component Type RED CELLS,LR     Unit division 00     Status of Unit ALLOCATED     Donor AG Type NEGATIVE FOR E ANTIGEN     Transfusion Status OK TO TRANSFUSE     Crossmatch Result COMPATIBLE     Unit Number [id number]     Blood Component Type RBC LR  PHER2     Unit division 00     Status of Unit ALLOCATED     Donor AG Type NEGATIVE FOR E ANTIGEN     Transfusion Status OK TO TRANSFUSE     Crossmatch Result COMPATIBLE    BODY FLUID CULTURE     Status: None   Collection Time    08/24/14  8:09 AM      Result Value Ref Range   Specimen Description FLUID LEFT HIP     Special Requests NONE     Gram Stain       Value: MODERATE WBC PRESENT,BOTH PMN AND MONONUCLEAR     NO ORGANISMS SEEN     Gram Stain Report Called to,Read Back By and Verified With: Gram Stain Report Called to,Read Back By and Verified With: P.COTTA AT 0855 ON 08/24/14 BY SHUEA Performed by [facility]     Performed at [facility]    Culture PENDING     Report Status PENDING    ANAEROBIC CULTURE     Status: None   Collection Time    08/24/14  8:09 AM      Result Value Ref Range   Specimen Description FLUID LEFT HIP     Special Requests NONE     Gram Stain       Value: MODERATE WBC PRESENT,BOTH PMN AND MONONUCLEAR     NO ORGANISMS SEEN     Gram Stain Report Called to,Read Back By and Verified With: Gram Stain Report Called to,Read Back By and Verified With: P.COTTA AT 0855 ON 08/24/14 BY SHUEA Performed by [facility]     Performed at [facility]   Culture PENDING     Report Status PENDING    GRAM STAIN     Status: None   Collection Time    08/24/14  8:09 AM      Result Value Ref Range   Specimen Description FLUID LEFT HIP     Special Requests NONE     Gram Stain       Value: MODERATE WBC PRESENT,BOTH PMN AND MONONUCLEAR     NO ORGANISMS SEEN     Gram Stain Report Called to,Read Back By and Verified With: Archie Balboa RN AT [phone] ON 10.30.15 BY SHUEA   Report Status 08/24/2014 FINAL    PREPARE RBC (CROSSMATCH)     Status: None   Collection Time    08/24/14  8:50 AM      Result Value Ref Range   Order Confirmation ORDER PROCESSED BY BLOOD BANK    POCT I-STAT EG7     Status: Abnormal   Collection Time    08/24/14  9:21 AM      Result Value Ref Range   pH, Ven 7.368 (*) 7.250 - 7.300   pCO2, Ven 47.8  45.0 - 50.0 mmHg   pO2, Ven 281.0 (*) 30.0 - 45.0 mmHg   Bicarbonate 27.8 (*) 20.0 - 24.0 mEq/L   TCO2 29  0 - 100 mmol/L   O2 Saturation 100.0     Acid-Base Excess 2.0  0.0 - 2.0 mmol/L   Sodium 138  137 - 147 mEq/L   Potassium 4.1  3.7 - 5.3 mEq/L   Calcium, Ion 1.22  1.12 - 1.23 mmol/L   HCT 22.0 (*) 36.0 - 46.0 %   Hemoglobin 7.5 (*) 12.0 - 15.0 g/dL   Patient temperature 96.0 C     Sample type VENOUS    POCT I-STAT  EG7     Status: Abnormal   Collection Time    08/24/14 10:22 AM      Result Value Ref Range   pH, Ven 7.375 (*) 7.250 - 7.300   pCO2, Ven 45.2  45.0 - 50.0 mmHg   pO2,  Ven 295.0 (*) 30.0 - 45.0 mmHg   Bicarbonate 26.8 (*) 20.0 - 24.0 mEq/L   TCO2 28  0 - 100 mmol/L   O2 Saturation 100.0     Acid-Base Excess 1.0  0.0 - 2.0 mmol/L   Sodium 137  137 - 147 mEq/L   Potassium 4.6  3.7 - 5.3 mEq/L   Calcium, Ion 1.19  1.12 - 1.23 mmol/L   HCT 27.0 (*) 36.0 - 46.0 %   Hemoglobin 9.2 (*) 12.0 - 15.0 g/dL   Patient temperature 19.135.6 C     Sample type VENOUS    PREPARE RBC (CROSSMATCH)     Status: None   Collection Time    08/24/14 10:24 AM      Result Value Ref Range   Order Confirmation ORDER PROCESSED BY BLOOD BANK    CBC     Status: Abnormal   Collection Time    08/24/14  4:14 PM      Result Value Ref Range   WBC 6.9  4.0 - 10.5 K/uL   RBC 3.76 (*) 3.87 - 5.11 MIL/uL   Hemoglobin 11.6 (*) 12.0 - 15.0 g/dL   Comment: DELTA CHECK NOTED     POST TRANSFUSION SPECIMEN   HCT 32.4 (*) 36.0 - 46.0 %   MCV 86.2  78.0 - 100.0 fL   MCH 30.9  26.0 - 34.0 pg   MCHC 35.8  30.0 - 36.0 g/dL   RDW 47.813.4  29.511.5 - 62.115.5 %   Platelets 100 (*) 150 - 400 K/uL   Comment: REPEATED TO VERIFY     SPECIMEN CHECKED FOR CLOTS     PLATELET COUNT CONFIRMED BY SMEAR   @BRIEFLABTABLE (sdes,specrequest,cult,reptstatus)   ) Recent Results (from the past 720 hour(s))  SURGICAL PCR SCREEN     Status: None   Collection Time    08/17/14  9:05 AM      Result Value Ref Range Status   MRSA, PCR NEGATIVE  NEGATIVE Final   Staphylococcus aureus NEGATIVE  NEGATIVE Final   Comment:            The Xpert SA Assay (FDA     approved for NASAL specimens     in patients over 57 years of age),     is one component of     a comprehensive surveillance     program.  Test performance has     been validated by [facility] for patients greater     than or equal to 57 year old.     It is not intended     to diagnose infection nor to     guide or monitor treatment.  BODY FLUID CULTURE     Status: None   Collection Time    08/24/14  8:09 AM      Result Value Ref Range Status   Specimen  Description FLUID LEFT HIP   Final   Special Requests NONE   Final   Gram Stain     Final   Value: MODERATE WBC PRESENT,BOTH PMN AND MONONUCLEAR     NO ORGANISMS SEEN     Gram Stain Report Called to,Read Back By and Verified With:  Gram Stain Report Called to,Read Back By and Verified With: P.COTTA AT 0855 ON 08/24/14 BY SHUEA Performed by [facility]     Performed at [facility]   Culture PENDING   Incomplete   Report Status PENDING   Incomplete  ANAEROBIC CULTURE     Status: None   Collection Time    08/24/14  8:09 AM      Result Value Ref Range Status   Specimen Description FLUID LEFT HIP   Final   Special Requests NONE   Final   Gram Stain     Final   Value: MODERATE WBC PRESENT,BOTH PMN AND MONONUCLEAR     NO ORGANISMS SEEN     Gram Stain Report Called to,Read Back By and Verified With: Gram Stain Report Called to,Read Back By and Verified With: P.COTTA AT 0855 ON 08/24/14 BY SHUEA Performed by [facility]     Performed at [facility]   Culture PENDING   Incomplete   Report Status PENDING   Incomplete  GRAM STAIN     Status: None   Collection Time    08/24/14  8:09 AM      Result Value Ref Range Status   Specimen Description FLUID LEFT HIP   Final   Special Requests NONE   Final   Gram Stain     Final   Value: MODERATE WBC PRESENT,BOTH PMN AND MONONUCLEAR     NO ORGANISMS SEEN     Gram Stain Report Called to,Read Back By and Verified With: P. COTTA RN AT 0855 ON 10.30.15 BY SHUEA   Report Status 08/24/2014 FINAL   Final     Impression/Recommendation  Principal Problem:   Septic arthritis of hip   [name] is a 58 y.o. 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 I cannot tell that she was "cleared for surgery" and it certainly is clear that her Orthopedist wished for a minimum of a year of oral abx with stability and this appears to be what her ID MD wished as well.   --Because she apparently had leukopenia while on vancomycin, I will switch her to daptomycin IV  --I will order a CT of her left hip to help elucidate if there are any other areas suspicious for infection that were not readily apparent in the OR  --I will dc her lasix for now in anticipation of contrast exposure tomorrow  --I have concern about her MRSA potentially establishing itself in other areas where there is metal, hardware and will likely add rifampin  --followup cultures  I think that she clearly needs protracted IV then oral antibiotics and clear lines of communication between her multiple disciplinary MDs to give her a chance at uring this infection  #2 Screening: will screen for HIV and hep viruses  Dr. Drue Second is covering this weekend and available for questions.   Dr.    08/24/2014, 5:53 PM   Thank you so much for this interesting consult  [facility] [phone] (pager) [phone] (office) 08/24/2014, 5:53 PM  [name] 08/24/2014, 5:53 PM

## 2014-08-24 NOTE — Progress Notes (Signed)
Summary of office notes for I.D. Consult:  09/11/10 - ORIF on the left for an acetabular fracture with non-union.   Feb. 2012 - Left Total Hip Replacement ([facility])  10/31/2013 - Left Total Hip Revision  12/05/2013 Readmitted with fever of 103 and WBC of 13,800.   12/06/2013 I & D of Left Hip (CT aspiration with purulent fluid) Fluid and deep culture grew out MRSA  12/14/2013 Resection Arthroplasty and Placement of Antibiotic Spacer. 9 week of IV therapy ended 02/08/2014 Dr. Elesa MassedWard recommended one year of oral Bactrim Patient was adamant that the hip would be put back in.  Seen in 2nd opinion by Dr. Lequita HaltAluisio on 06/12/2014 She stated that she was just recently cleared by her Infectious Disease physician and told that the infection had cleared up.  Admitted for surgery but fluid was questionable and tissues were very inflamed. Revised and placed another antibiotic spacer.  ID. Consult called.  Avel Peacerew Babacar Haycraft, PA-C

## 2014-08-24 NOTE — Progress Notes (Signed)
Utilization review completed.  

## 2014-08-24 NOTE — Progress Notes (Signed)
Peripherally Inserted Central Catheter/Midline Placement  The IV Nurse has discussed with the patient and/or persons authorized to consent for the patient, the purpose of this procedure and the potential benefits and risks involved with this procedure.  The benefits include less needle sticks, lab draws from the catheter and patient may be discharged home with the catheter.  Risks include, but not limited to, infection, bleeding, blood clot (thrombus formation), and puncture of an artery; nerve damage and irregular heat beat.  Alternatives to this procedure were also discussed.  PICC/Midline Placement Documentation        Lisabeth DevoidGibbs, Azariyah Luhrs Jeanette 08/24/2014, 4:47 PM Consent obtained by Terie PurserAllen Currie, RN

## 2014-08-24 NOTE — Anesthesia Postprocedure Evaluation (Signed)
  Anesthesia Post-op Note  Patient: [name] PoundDeborah Hardebeck  Procedure(s) Performed: Procedure(s) (LRB): INCISION AND DRAINAGE LEFT HIP, EXCHANGE ANTIBIOTIC SPACER, OPEN REDUCTION LEFT FEMUR FRACTURE  (Left)  Patient Location: PACU  Anesthesia Type: General  Level of Consciousness: awake and alert   Airway and Oxygen Therapy: Patient Spontanous Breathing  Post-op Pain: mild  Post-op Assessment: Post-op Vital signs reviewed, Patient's Cardiovascular Status Stable, Respiratory Function Stable, Patent Airway and No signs of Nausea or vomiting  Last Vitals:  Filed Vitals:   08/24/14 1434  BP: 166/79  Pulse: 98  Temp: 36.4 C  Resp: 16    Post-op Vital Signs: stable   Complications: No apparent anesthesia complications. Required 3 units pRBCs. HGB to be rechecked this afternoon.

## 2014-08-24 NOTE — Progress Notes (Signed)
Clinical Social Work Department CLINICAL SOCIAL WORK PLACEMENT NOTE 08/24/2014  Patient:  [name],[name]  Account Number:  [id number] Admit date:  08/24/2014  Clinical Social Worker:  Cori RazorJAMIE Tassie Pollett, LCSW  Date/time:  08/24/2014 02:55 PM  Clinical Social Work is seeking post-discharge placement for this patient at the following level of care:   SKILLED NURSING   (*CSW will update this form in Epic as items are completed)     Patient/family provided with [facility] Department of Clinical Social Work's list of facilities offering this level of care within the geographic area requested by the patient (or if unable, by the patient's family).  08/24/2014  Patient/family informed of their freedom to choose among providers that offer the needed level of care, that participate in Medicare, Medicaid or managed care program needed by the patient, have an available bed and are willing to accept the patient.    Patient/family informed of [facility]' ownership interest in [facility], as well as of the fact that they are under no obligation to receive care at this facility.  PASARR submitted to EDS on 08/24/2014 PASARR number received on 08/24/2014  FL2 transmitted to all facilities in geographic area requested by pt/family on  08/24/2014 FL2 transmitted to all facilities within larger geographic area on   Patient informed that his/her managed care company has contracts with or will negotiate with  certain facilities, including the following:     Patient/family informed of bed offers received:  08/24/2014 [facility] Physician recommends and patient chooses bed at    Patient to be transferred to  on   Patient to be transferred to facility by  Patient and family notified of transfer on  Name of family member notified:    The following physician request were entered in Epic:   Additional Comments:  Cori RazorJamie Silva Aamodt LCSW [phone]

## 2014-08-24 NOTE — Progress Notes (Signed)
ANTIBIOTIC CONSULT NOTE - INITIAL  Pharmacy Consult for daptomycin Indication: infected prosthetic hip with potential osteomyelitis  Allergies  Allergen Reactions  . Vancomycin Other (See Comments)    LEUKOPENIA    Patient Measurements: Height: 5\' 10"  (177.8 cm) Weight: 239 lb (108.41 kg) IBW/kg (Calculated) : 68.5 Adjusted Body Weight: 84.5kg  Vital Signs: Temp: 97.5 F (36.4 C) (10/30 1533) Temp Source: Oral (10/30 1533) BP: 162/85 mmHg (10/30 1533) Pulse Rate: 99 (10/30 1533)  Labs:  Recent Labs  08/24/14 0921 08/24/14 1022 08/24/14 1614  WBC  --   --  6.9  HGB 7.5* 9.2* 11.6*  PLT  --   --  100*   Estimated Creatinine Clearance: 98.6 ml/min (by C-G formula based on Cr of 0.84).  Medical History: Past Medical History  Diagnosis Date  . Tenosynovitis of fingers     RIGHT RING AND SMALL-HAD SURGERY 07/12/14 [facility]  . Bronchitis     dx  07-04-2014 (approx) will finished antibiotic 07-14-2014  . History of concussion     x4  --  no residual  . Migraines   . Depression   . 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 GERD (gastroesophageal reflux disease)   . H/O hiatal hernia   . Chronic constipation   . Chronic narcotic use   . Nocturia   . SUI (stress urinary incontinence, female)   . OA (osteoarthritis)     hips, knees, hips  . Infection of left prosthetic hip joint     left total arthroplasty done 2012/  jan 2015 removal of prosthesis  . Full dentures   . History of cellulitis     lower extremities  . Thinning of skin     easily tears  . Venous stasis dermatitis     BILATERAL LEGS  . Productive cough     RESOLVED - QUIT SMOKING  . Memory problem   . OSA on CPAP     study x3   last one few yrs ago  cpap recommended -- DOES NOT USE OR HAVE CPAP    Medications:  Scheduled:  . acetaminophen  1,000 mg Oral 4 times per day  . [START ON 08/25/2014]  citalopram  40 mg Oral q morning - 10a  . [START ON 08/25/2014] dexamethasone  10 mg Intravenous Once  . docusate sodium  100 mg Oral BID  . [START ON 08/25/2014] gabapentin  400 mg Oral BID WC  . gabapentin  800 mg Oral QHS  . HYDROmorphone      . Naloxegol Oxalate  25 mg Oral QHS  . oxymorphone  20 mg Oral Q12H  . [START ON 08/25/2014] pantoprazole  40 mg Oral Daily  . [START ON 08/25/2014] rivaroxaban  10 mg Oral Q breakfast  . traZODone  150-300 mg Oral QHS   Infusions:  . sodium chloride     Assessment: [age] yoF with complications of internal orthopedic device of left hip (including multiple surgeries and prolonged courses of antibiotics since 2011- see Dr. [name] Dam's consult note from 10/30 for details) now s/p left total hip reimplantation 10/30. Pharmacy originally consulted to dose vancomycin post-op for septic arthritis hip, now being changed to daptomycin per Pharmacy.   Antiinfectives 10/30 >> Vanc x1 10/30 >> daptomycin >>  Labs / vitals Tmax: afebrile WBC: WNL Renal: SCr 0.84 10/23, CrCl 98 ml/min CG, CrCl~83  ml/min (normalized)  Microbiology 10/30:  Fluid from left hip sent for culture.  Gram stain shows moderate WBC.   10/30: D1 daptomycin 650mg  daily (6mg /kg TBW) for septic arthritic hip. S/p 1 dose vancomycin 2250mg . Changed to daptomycin by infectious diseases MD due to leukopenia with vancomycin.   Goal of Therapy:  daptomycin per indication and renal function  Plan:  - start daptomycin 650mg  IV q24h - check CK in AM and qWeek while on daptomycin therapy - pharmacy will follow-up daily  Thank you for the consult.  Ross LudwigJesse Usbaldo Pannone Akers, PharmD, BCPS Pager: [phone] Pharmacy: [phone] 08/24/2014 7:33 PM

## 2014-08-24 NOTE — Interval H&P Note (Signed)
History and Physical Interval Note:  08/24/2014 7:15 AM  [name]  has presented today for surgery, with the diagnosis of LEFT HIP INFECTION RESECTION ARTHROPLASTY   The various methods of treatment have been discussed with the patient and family. After consideration of risks, benefits and other options for treatment, the patient has consented to  Procedure(s): LEFT TOTAL HIP ARTHROPLASTY REIMPLANTATION  (Left) as a surgical intervention .  The patient's history has been reviewed, patient examined, no change in status, stable for surgery.  I have reviewed the patient's chart and labs.  Questions were answered to the patient's satisfaction.     Loanne DrillingALUISIO,Zeyad Delaguila V

## 2014-08-24 NOTE — Brief Op Note (Signed)
08/24/2014  10:54 AM  PATIENT:  [name]  58 y.o. female  PRE-OPERATIVE DIAGNOSIS:  LEFT HIP INFECTION RESECTION ARTHROPLASTY   POST-OPERATIVE DIAGNOSIS:  LEFT HIP INFECTION RESECTION ARTHROPLASTY  PROCEDURE:  Procedure(s): INCISION AND DRAINAGE LEFT HIP, EXCHANGE ANTIBIOTIC SPACER, OPEN REDUCTION LEFT FEMUR FRACTURE  (Left)  SURGEON:  Surgeon(s) and Role:    * Loanne DrillingFrank Jaquel Coomer V, MD - Primary  PHYSICIAN ASSISTANT:   ASSISTANTS: Avel Peacerew Perkins, PA-C   ANESTHESIA:   general  EBL:  Total I/O In: 4370 [I.V.:3700; Blood:670] Out: 2525 [Urine:525; Blood:2000]  DRAINS: (Medium) Hemovact drain(s) in the left hip with  Suction Open   LOCAL MEDICATIONS USED:  NONE   TOURNIQUET:  * No tourniquets in log *  DICTATION: .Other Dictation: Dictation Number [id number]  PLAN OF CARE: Admit to inpatient   PATIENT DISPOSITION:  PACU - hemodynamically stable.

## 2014-08-24 NOTE — H&P (View-Only) (Signed)
[name] DOB: [birth year] Married / Language: English / Race: White Female Date of Admission: 08/24/2014 Chief complaint:  Left hip pain History of Present Illness The patient is a 58 year old female who comes in  for a preoperative history and physical. The patient is scheduled for a left total hip reimplantation to be performed by Dr. Gus RankinFrank V. Aluisio, MD at [facility] on 08-24-2014. The patient is a 58 year old female who presents with a hip problem. The patient was seen for a second opinion.The patient reports left hip. Prior to being seen the patient was previously evaluated by an out of town physician. 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 She had issues with the screws, and they were revised at the time of her total hip replacement in 2012. [facility]. The following [facility] January of this year. She had an infection, and had to have an antibiotic spacer placed. The images she brought were on a DVD instead of CD, so they could not be imported into our system. She has a long complex history with regard to this left hip. She initially had the acetabular fracture which was treated with open reduction and internal fixation at [facility]. She had most of her procedures in the past done at [facility]. She had both knees replaced there. She subsequently went to [facility] Con[facility] and eventually had a hip replacement in 2012. Unfortunately that eventually loosened. [place] to [place], [place] and, for convenience purposes, was seen at [facility]. Dr. Elesa MassedWard performed a total hip arthroplasty earlier this year. She did great for the first few weeks and was developing drainage from the hip. Approximately 4-6 weeks after surgery, she was taken back and had a grossly infected hip. It was treated with resection arthroplasty with an antibiotic spacer. She had prolonged antibiotics.  She says she was just recently cleared by her Infectious Disease physician and told that the infection had cleared up. She is ready to proceed with her surgery at this time.  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 First Degree Relatives reported Heart disease in female family member before age 57 Diabetes Mellitus Maternal Grandmother. Cerebrovascular Accident Sister. Osteoarthritis Brother, Maternal Grandmother, Sister. Rheumatoid Arthritis Maternal Grandmother, Mother. Heart Disease Sister. Chronic Obstructive Lung Disease Brother, Mother. Depression Brother, Sister. Cancer Father, Mother.  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 06/12/2014: smoke(d) 1/2 pack(s) per day Former drinker 06/12/2014: In the past drank  Medication History Movantik (25MG  Tablet, Oral) Active. Gabapentin (400MG  Capsule, Oral) Active. (4 x per day) TraZODone HCl (150MG  Tablet, Oral) Active. (BID) Omeprazole (20MG  Capsule DR, Oral) Active. (once daily) Meloxicam (7.5MG  Tablet, Oral) Active. (BID) Citalopram Hydrobromide (40MG  Tablet, Oral) Active. (once daily) Oxybutynin Chloride (5MG  Tablet, Oral) Active. (TID) Cyclobenzaprine HCl (10MG  Tablet, Oral) Active. (BID PRN) SUMAtriptan Succinate (100MG  Tablet, Oral) Active. (PRN) Furosemide (40MG  Tablet, Oral) Active. (1 1/2 PRN) OxyCODONE HCl (10MG  Tablet, Oral) Active. (TID PRN) Opana ER (20MG   Tab 12HR  Deter, Oral) Active. (BID) Multivitamins (Oral) Active. Vistaril (25MG  Capsule, Oral) Active.   Past Surgical History  Arthroscopy of Knee left Total Hip Replacement left Hip Fracture and Surgery left Total Knee Replacement bilateral Gallbladder Surgery laporoscopic Foot Surgery left Appendectomy Date: 1974. Resection Arthroplasty Left Total Hip  Medical History Migraine Headache Depression Osteoarthritis Anxiety Disorder Peripheral Neuropathy Chronic Pain Periprosthetic Infection Left Total Hip   Review of Systems General Not Present- Chills, Fatigue, Fever, Memory Loss, Night Sweats, Weight Gain and Weight Loss. Skin Not Present- Eczema, Hives, Itching, Lesions and Rash. HEENT Not Present- Dentures, Double Vision, Headache, Hearing Loss, Tinnitus and Visual Loss. Respiratory Not Present- Allergies, Chronic Cough, Coughing up blood, Shortness of breath at rest and Shortness of breath with exertion. Cardiovascular Not Present- Chest Pain, Difficulty Breathing Lying Down, Murmur, Palpitations, Racing/skipping heartbeats and Swelling. Gastrointestinal Not Present- Abdominal Pain, Bloody Stool, Constipation, Diarrhea, Difficulty Swallowing, Heartburn, Jaundice, Loss of appetitie, Nausea and Vomiting. Female Genitourinary Not Present- Blood in Urine, Discharge, Flank Pain, Incontinence, Painful Urination, Urgency, Urinary frequency, Urinary Retention, Urinating at Night and Weak urinary stream. Musculoskeletal Present- Joint Pain. Not Present- Back Pain, Joint Swelling, Morning Stiffness, Muscle Pain, Muscle Weakness and Spasms. Neurological Not Present- Blackout spells, Difficulty with balance, Dizziness, Paralysis, Tremor and Weakness. Psychiatric Not Present- Insomnia.  Vitals Height: 71in Pulse: 68 (Regular)  BP: 104/50 (Sitting, Right Arm, Standard)   Physical Exam  General Mental Status -Alert, cooperative and good historian. General  Appearance-pleasant, Not in acute distress. Orientation-Oriented X3. Build & Nutrition-Well nourished and Well developed.  Head and Neck Head-normocephalic, atraumatic . Neck Global Assessment - supple, no bruit auscultated on the right, no bruit auscultated on the left. Note: upper and lower dentures   Eye Vision -Note: wears glasses.  Pupil - Bilateral-Regular and Round. Motion - Bilateral-EOMI.  Chest and Lung Exam Auscultation Breath sounds - clear at anterior chest wall and clear at posterior chest wall. Adventitious sounds - No Adventitious sounds.  Cardiovascular Auscultation Rhythm - Regular rate and rhythm. Heart Sounds - S1 WNL and S2 WNL. Murmurs & Other Heart Sounds - Auscultation of the heart reveals - No Murmurs.  Abdomen Inspection Contour - Generalized mild distention. Palpation/Percussion Tenderness - Abdomen is non-tender to palpation. Rigidity (guarding) - Abdomen is soft. Auscultation Auscultation of the abdomen reveals - Bowel sounds normal.  Female Genitourinary Note: Not done, not pertinent to present illness   Musculoskeletal Note: On exam very pleasant well developed female alert and oriented in no apparent distress. She is in today in a motorized scooter. She has significant scarring along the posterolateral aspect of her left hip. It is a contracted scar. There is no sinus and no drainage. There is no warmth about it. She does not have any tenderness about the hip. There is no swelling noted. She is nontender about her replaced knee on that side. There is no instability noted.  RADIOGRAPHS AP of pelvis and lateral of the hip demonstrate an antibiotic spacer intact. Her femoral bone appears to be in excellent condition. She has slight displacement of her acetabulum, but the fear is that the original fracture is healed.   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 Lequita Halt.  Plan is to go to [facility]  PCP - [facility] Primary Care [facility] Hematology and Oncology - Dr. Thomasena Edis I.D. - Dr. Zorita Pang and Nobie Putnam, [facility]  The  patient does not have any contraindications and will receive TXA (tranexamic acid) prior to surgery.  Please note that at the time of her preop H&P, she has a pending CT scan of the left hip to be performed on Monday 08/20/2014.  Signed electronically by Lauraine RinneAlexzandrew L Perkins, III PA-C

## 2014-08-25 ENCOUNTER — Inpatient Hospital Stay (HOSPITAL_COMMUNITY): Payer: Medicare Other

## 2014-08-25 LAB — HEPATITIS PANEL, ACUTE
HCV Ab: NEGATIVE
HEP A IGM: NONREACTIVE
HEP B C IGM: NONREACTIVE
Hepatitis B Surface Ag: NEGATIVE

## 2014-08-25 LAB — BASIC METABOLIC PANEL
Anion gap: 9 (ref 5–15)
BUN: 10 mg/dL (ref 6–23)
CALCIUM: 7.7 mg/dL — AB (ref 8.4–10.5)
CHLORIDE: 106 meq/L (ref 96–112)
CO2: 26 meq/L (ref 19–32)
Creatinine, Ser: 0.63 mg/dL (ref 0.50–1.10)
GFR calc Af Amer: >90 mL/min (ref >90)
GFR calc non Af Amer: >90 mL/min (ref >90)
GLUCOSE: 109 mg/dL — AB (ref 70–99)
Potassium: 3.8 mEq/L (ref 3.7–5.3)
SODIUM: 141 meq/L (ref 137–147)

## 2014-08-25 LAB — CBC
HCT: 28.4 % — ABNORMAL LOW (ref 36.0–46.0)
HEMOGLOBIN: 9.9 g/dL — AB (ref 12.0–15.0)
MCH: 30.6 pg (ref 26.0–34.0)
MCHC: 34.9 g/dL (ref 30.0–36.0)
MCV: 87.7 fL (ref 78.0–100.0)
Platelets: 92 10*3/uL — ABNORMAL LOW (ref 150–400)
RBC: 3.24 MIL/uL — AB (ref 3.87–5.11)
RDW: 14.2 % (ref 11.5–15.5)
WBC: 4.6 10*3/uL (ref 4.0–10.5)

## 2014-08-25 LAB — SEDIMENTATION RATE: Sed Rate: 17 mm/hr (ref 0–22)

## 2014-08-25 LAB — C-REACTIVE PROTEIN: CRP: 2.2 mg/dL — ABNORMAL HIGH (ref <0.60)

## 2014-08-25 LAB — CK: CK TOTAL: 358 U/L — AB (ref 7–177)

## 2014-08-25 LAB — HIV ANTIBODY (ROUTINE TESTING W REFLEX): HIV 1&2 Ab, 4th Generation: NONREACTIVE

## 2014-08-25 MED ORDER — IOHEXOL 300 MG/ML  SOLN
100.0000 mL | Freq: Once | INTRAMUSCULAR | Status: AC | PRN
  Administered 2014-08-25: 100 mL via INTRAVENOUS

## 2014-08-25 NOTE — Discharge Instructions (Addendum)
Dr. Gaynelle Arabian Total Joint Specialist [facility] [address], [place], [place] [address] [phone]   TOTAL HIP REPLACEMENT POSTOPERATIVE DIRECTIONS    Hip Rehabilitation, Guidelines Following Surgery  The results of a hip operation are greatly improved after range of motion and muscle strengthening exercises. Follow all safety measures which are given to protect your hip. If any of these exercises cause increased pain or swelling in your joint, decrease the amount until you are comfortable again. Then slowly increase the exercises. Call your caregiver if you have problems or questions.  HOME CARE INSTRUCTIONS  Most of the following instructions are designed to prevent the dislocation of your new hip.  Remove items at home which could result in a fall. This includes throw rugs or furniture in walking pathways.  Continue medications as instructed at time of discharge.  You may have some home medications which will be placed on hold until you complete the course of blood thinner medication.  You may start showering once you are discharged home but do not submerge the incision under water. Just pat the incision dry and apply a dry gauze dressing on daily. Do not put on socks or shoes without following the instructions of your caregivers.  Sit on high chairs so your hips are not bent more than 90 degrees.  Sit on chairs with arms. Use the chair arms to help push yourself up when arising.  Keep your leg on the side of the operation out in front of you when standing up.  Arrange for the use of a toilet seat elevator so you are not sitting low.  Do not do any exercises or get in any positions that cause your toes to point in (pigeon toed).  Always sleep with a pillow between your legs. Do not lie on your side in sleep with both knees touching the bed.   Walk with walker as instructed.  You may resume a sexual relationship in one month or when given the OK by  your caregiver.  Use walker as long as suggested by your caregivers.  You may put full weight on your legs and walk as much as is comfortable. Avoid periods of inactivity such as sitting longer than an hour when not asleep. This helps prevent blood clots.  You may return to work once you are cleared by Engineer, production.  Do not drive a car for 6 weeks or until released by your surgeon.  Do not drive while taking narcotics.  Wear elastic stockings for three weeks following surgery during the day but you may remove then at night.  Make sure you keep all of your appointments after your operation with all of your doctors and caregivers. You should call the office at the above phone number and make an appointment for approximately two weeks after the date of your surgery. Change the dressing daily and reapply a dry dressing each time. Please pick up a stool softener and laxative for home use as long as you are requiring pain medications.  Continue to use ice on the hip for pain and swelling from surgery. You may notice swelling that will progress down to the foot and ankle.  This is normal after  surgery.  Elevate the leg when you are not up walking on it.   It is important for you to complete the blood thinner medication as prescribed by your doctor.  Continue to use the breathing machine which will help keep your temperature down.  It  is common for your temperature to cycle up and down following surgery, especially at night when you are not up moving around and exerting yourself.  The breathing machine keeps your lungs expanded and your temperature down.  RANGE OF MOTION AND STRENGTHENING EXERCISES  These exercises are designed to help you keep full movement of your hip joint. Follow your caregiver's or physical therapist's instructions. Perform all exercises about fifteen times, three times per day or as directed. Exercise both hips, even if you have had only one joint replacement. These exercises can be  done on a training (exercise) mat, on the floor, on a table or on a bed. Use whatever works the best and is most comfortable for you. Use music or television while you are exercising so that the exercises are a pleasant break in your day. This will make your life better with the exercises acting as a break in routine you can look forward to.  Lying on your back, slowly slide your foot toward your buttocks, raising your knee up off the floor. Then slowly slide your foot back down until your leg is straight again.  Lying on your back spread your legs as far apart as you can without causing discomfort.  Lying on your side, raise your upper leg and foot straight up from the floor as far as is comfortable. Slowly lower the leg and repeat.  Lying on your back, tighten up the muscle in the front of your thigh (quadriceps muscles). You can do this by keeping your leg straight and trying to raise your heel off the floor. This helps strengthen the largest muscle supporting your knee.  Lying on your back, tighten up the muscles of your buttocks both with the legs straight and with the knee bent at a comfortable angle while keeping your heel on the floor.   SKILLED REHAB INSTRUCTIONS: If the patient is transferred to a skilled rehab facility following release from the hospital, a list of the current medications will be sent to the facility for the patient to continue.  When discharged from the skilled rehab facility, please have the facility set up the patient's Home Health Physical Therapy prior to being released. Also, the skilled facility will be responsible for providing the patient with their medications at time of release from the facility to include their pain medication, the muscle relaxants, and their blood thinner medication. If the patient is still at the rehab facility at time of the two week follow up appointment, the skilled rehab facility will also need to assist the patient in arranging follow up  appointment in our office and any transportation needs.  MAKE SURE YOU:  Understand these instructions.  Will watch your condition.  Will get help right away if you are not doing well or get worse.  Pick up stool softner and laxative for home. Do not submerge incision under water. May shower. Continue to use ice for pain and swelling from surgery. Hip precautions.  Total Hip Protocol.  Take Xarelto for two and a half more weeks, then discontinue Xarelto. Once the patient has completed the Xarelto, they may resume the 325 mg Aspirin.  When discharged from the skilled rehab facility, please have the facility set up the patient's Home Health Physical Therapy prior to being released.  Also provide the patient with their medications at time of release from the facility to include their pain medication, the muscle relaxants, and their blood thinner medication.  If the patient is still at the  rehab facility at time of follow up appointment, please also assist the patient in arranging follow up appointment in our office and any transportation needs.   Postoperative Constipation Protocol  Constipation - defined medically as fewer than three stools per week and severe constipation as less than one stool per week.  One of the most common issues patients have following surgery is constipation.  Even if you have a regular bowel pattern at home, your normal regimen is likely to be disrupted due to multiple reasons following surgery.  Combination of anesthesia, postoperative narcotics, change in appetite and fluid intake all can affect your bowels.  In order to avoid complications following surgery, here are some recommendations in order to help you during your recovery period.  Colace (docusate) - Pick up an over-the-counter form of Colace or another stool softener and take twice a day as long as you are requiring postoperative pain medications.  Take with a full glass of water daily.  If you experience  loose stools or diarrhea, hold the colace until you stool forms back up.  If your symptoms do not get better within 1 week or if they get worse, check with your doctor.  Dulcolax (bisacodyl) - Pick up over-the-counter and take as directed by the product packaging as needed to assist with the movement of your bowels.  Take with a full glass of water.  Use this product as needed if not relieved by Colace only.   MiraLax (polyethylene glycol) - Pick up over-the-counter to have on hand.  MiraLax is a solution that will increase the amount of water in your bowels to assist with bowel movements.  Take as directed and can mix with a glass of water, juice, soda, coffee, or tea.  Take if you go more than two days without a movement. Do not use MiraLax more than once per day. Call your doctor if you are still constipated or irregular after using this medication for 7 days in a row.  If you continue to have problems with postoperative constipation, please contact the office for further assistance and recommendations.  If you experience "the worst abdominal pain ever" or develop nausea or vomiting, please contact the office immediatly for further recommendations for treatment.   Information on my medicine - XARELTO (Rivaroxaban)  This medication education was reviewed with me or my healthcare representative as part of my discharge preparation.  The pharmacist that spoke with me during my hospital stay was:  Otho BellowsGreen, Terri L, [facility]  Why was Xarelto prescribed for you? Xarelto was prescribed for you to reduce the risk of blood clots forming after orthopedic surgery. The medical term for these abnormal blood clots is venous thromboembolism (VTE).  What do you need to know about xarelto ? Take your Xarelto ONCE DAILY at the same time every day. You may take it either with or without food.  If you have difficulty swallowing the tablet whole, you may crush it and mix in applesauce just prior to taking your  dose.  Take Xarelto exactly as prescribed by your doctor and DO NOT stop taking Xarelto without talking to the doctor who prescribed the medication.  Stopping without other VTE prevention medication to take the place of Xarelto may increase your risk of developing a clot.  After discharge, you should have regular check-up appointments with your healthcare provider that is prescribing your Xarelto.    What do you do if you miss a dose? If you miss a dose, take it as soon  as you remember on the same day then continue your regularly scheduled once daily regimen the next day. Do not take two doses of Xarelto on the same day.   Important Safety Information A possible side effect of Xarelto is bleeding. You should call your healthcare provider right away if you experience any of the following: ? Bleeding from an injury or your nose that does not stop. ? Unusual colored urine (red or dark brown) or unusual colored stools (red or black). ? Unusual bruising for unknown reasons. ? A serious fall or if you hit your head (even if there is no bleeding).  Some medicines may interact with Xarelto and might increase your risk of bleeding while on Xarelto. To help avoid this, consult your healthcare provider or pharmacist prior to using any new prescription or non-prescription medications, including herbals, vitamins, non-steroidal anti-inflammatory drugs (NSAIDs) and supplements. Mobic  This website has more information on Xarelto: [email or web address].com.br.

## 2014-08-25 NOTE — Progress Notes (Signed)
Subjective: 1 Day Post-Op Procedure(s) (LRB): INCISION AND DRAINAGE LEFT HIP, EXCHANGE ANTIBIOTIC SPACER, OPEN REDUCTION LEFT FEMUR FRACTURE  (Left) Patient reports pain as moderate.   We will start therapy today.  Plan is to go Rehab after hospital stay.  Objective: Vital signs in last 24 hours: Temp:  [97.5 F (36.4 C)-98.4 F (36.9 C)] 98 F (36.7 C) (10/31 0409) Pulse Rate:  [90-103] 90 (10/31 0409) Resp:  [12-21] 14 (10/31 0409) BP: (99-166)/(42-89) 99/42 mmHg (10/31 0409) SpO2:  [94 %-100 %] 95 % (10/31 0409) Weight:  [239 lb (108.41 kg)] 239 lb (108.41 kg) (10/30 1230)  Intake/Output from previous day:  Intake/Output Summary (Last 24 hours) at 08/25/14 0832 Last data filed at 08/25/14 0659  Gross per 24 hour  Intake   5393 ml  Output   4900 ml  Net    493 ml    Intake/Output this shift:    Labs:  Recent Labs  08/24/14 0921 08/24/14 1022 08/24/14 1614 08/25/14 0445  HGB 7.5* 9.2* 11.6* 9.9*    Recent Labs  08/24/14 1614 08/25/14 0445  WBC 6.9 4.6  RBC 3.76* 3.24*  HCT 32.4* 28.4*  PLT 100* 92*    Recent Labs  08/24/14 1022 08/25/14 0445  NA 137 141  K 4.6 3.8  CL  --  106  CO2  --  26  BUN  --  10  CREATININE  --  0.63  GLUCOSE  --  109*  CALCIUM  --  7.7*   No results found for this basename: LABPT, INR,  in the last 72 hours  EXAM General - Patient is Alert, Appropriate and Oriented Extremity - Neurologically intact Neurovascular intact No cellulitis present Compartment soft Dressing - dressing C/D/I Motor Function - intact, moving foot and toes well on exam.   Past Medical History  Diagnosis Date  . Tenosynovitis of fingers     RIGHT RING AND SMALL-HAD SURGERY 07/12/14 [facility]  . Bronchitis     dx  07-04-2014 (approx) will finished antibiotic 07-14-2014  . History of concussion     x4  --  no residual  . Migraines   . Depression   . Inability to ambulate due to hip     hx  left total hip  infection and removal arthroplasty /  uses w/c or crutches  . GERD (gastroesophageal reflux disease)   . H/O hiatal hernia   . Chronic constipation   . Chronic narcotic use   . Nocturia   . SUI (stress urinary incontinence, female)   . OA (osteoarthritis)     hips, knees, hips  . Infection of left prosthetic hip joint     left total arthroplasty done 2012/  jan 2015 removal of prosthesis  . Full dentures   . History of cellulitis     lower extremities  . Thinning of skin     easily tears  . Venous stasis dermatitis     BILATERAL LEGS  . Productive cough     RESOLVED - QUIT SMOKING  . Memory problem   . OSA on CPAP     study x3   last one few yrs ago  cpap recommended -- DOES NOT USE OR HAVE CPAP    Assessment/Plan: 1 Day Post-Op Procedure(s) (LRB): INCISION AND DRAINAGE LEFT HIP, EXCHANGE ANTIBIOTIC SPACER, OPEN REDUCTION LEFT FEMUR FRACTURE  (Left) Principal Problem:   Septic arthritis of hip   Advance diet Up with therapy Continue Daptomycin CT thigh  to R/O abscess   DVT Prophylaxis - Xarelto Drain out tomorrow Begin Therapy Hip Preacutions   Loanne DrillingALUISIO,Wofford Stratton V

## 2014-08-25 NOTE — Evaluation (Signed)
Physical Therapy Evaluation Patient Details Name: [name] Backhaus MRN: [id number] DOB: 1956/09/11 Today's Date: 08/25/2014   History of Present Illness  Antibiotic spacer L hip and ORIF L femur s/p spiral fx   Clinical Impression  Pt is cooperative and motivated but with functional mobility limitations 2* decreased L LE strength/ROM, post op pain, TWB and post THP.  Pt would benefit from follow up rehab at SNF level to maximize IND and safety prior to return home with limited assist.    Follow Up Recommendations SNF    Equipment Recommendations  None recommended by PT    Recommendations for Other Services OT consult     Precautions / Restrictions Precautions Precautions: Posterior Hip;Fall Restrictions Weight Bearing Restrictions: Yes LLE Weight Bearing: Touchdown weight bearing      Mobility  Bed Mobility Overal bed mobility: Needs Assistance;+2 for physical assistance Bed Mobility: Supine to Sit;Sit to Supine     Supine to sit: Mod assist;+2 for physical assistance Sit to supine: Mod assist;+2 for physical assistance   General bed mobility comments: cues for sequence and use of R LE to self assist  Transfers Overall transfer level: Needs assistance Equipment used: Rolling walker (2 wheeled) Transfers: Sit to/from Stand Sit to Stand: Mod assist;+2 physical assistance         General transfer comment: cues for LE management and use of UEs to self assist  Ambulation/Gait Ambulation/Gait assistance: Mod assist;+2 physical assistance Ambulation Distance (Feet): 2 Feet Assistive device: Rolling walker (2 wheeled) Gait Pattern/deviations: Step-to pattern;Decreased step length - right;Decreased step length - left;Shuffle Gait velocity: decr   General Gait Details: Pt shuffled sideways with RW to move up side of bed.  Pt stood at bedside x 5 min before needing to sit 2* c/o R LE weakness  Stairs            Wheelchair Mobility    Modified Rankin (Stroke  Patients Only)       Balance                                             Pertinent Vitals/Pain Pain Assessment: 0-10 Pain Score: 4  Pain Location: R hip and thigh Pain Descriptors / Indicators: Aching;Sore Pain Intervention(s): Limited activity within patient's tolerance;Monitored during session;Premedicated before session;Ice applied    Home Living Family/patient expects to be discharged to:: Skilled nursing facility                 Additional Comments: Pt interested in [facility]    Prior Function Level of Independence: Needs assistance   Gait / Transfers Assistance Needed: used power w/c for most mobility           Hand Dominance   Dominant Hand: Right    Extremity/Trunk Assessment   Upper Extremity Assessment: Overall WFL for tasks assessed           Lower Extremity Assessment: LLE deficits/detail   LLE Deficits / Details: post THP in effect     Communication   Communication: No difficulties  Cognition Arousal/Alertness: Awake/alert Behavior During Therapy: WFL for tasks assessed/performed Overall Cognitive Status: Within Functional Limits for tasks assessed                      General Comments      Exercises Total Joint Exercises Ankle Circles/Pumps: AROM;Both;15 reps;Supine  Assessment/Plan    PT Assessment Patient needs continued PT services  PT Diagnosis Difficulty walking   PT Problem List Decreased strength;Decreased range of motion;Decreased activity tolerance;Decreased mobility;Decreased knowledge of use of DME;Pain;Obesity;Decreased knowledge of precautions  PT Treatment Interventions DME instruction;Gait training;Functional mobility training;Therapeutic activities;Therapeutic exercise;Patient/family education   PT Goals (Current goals can be found in the Care Plan section) Acute Rehab PT Goals Patient Stated Goal: Rehab at [facility] and return home PT Goal Formulation: With patient Time  For Goal Achievement: 08/31/14 Potential to Achieve Goals: Good    Frequency Min 5X/week   Barriers to discharge        Co-evaluation               End of Session Equipment Utilized During Treatment: Gait belt;Left knee immobilizer Activity Tolerance: Patient limited by fatigue;Patient tolerated treatment well Patient left: in bed;with call bell/phone within reach Nurse Communication: Mobility status         Time: [id number] PT Time Calculation (min): 45 min   Charges:   PT Evaluation $Initial PT Evaluation Tier I: 1 Procedure PT Treatments $Therapeutic Activity: 23-37 mins   PT G Codes:          Byrne Capek 08/25/2014, 3:02 PM

## 2014-08-25 NOTE — Progress Notes (Signed)
OT Cancellation Note  Patient Details Name: [name] [name] MRN: [id number] DOB: Apr 25, 1956   Cancelled Treatment:    Reason Eval/Treat Not Completed: Other (comment)  Pt needed significant a with PT- will perform OT eval next day as pt can tolerate more activity .   Alba CoryREDDING, Deneshia Zucker D 08/25/2014, 12:51 PM

## 2014-08-26 LAB — CBC
HEMATOCRIT: 22.5 % — AB (ref 36.0–46.0)
Hemoglobin: 7.7 g/dL — ABNORMAL LOW (ref 12.0–15.0)
MCH: 30.3 pg (ref 26.0–34.0)
MCHC: 34.2 g/dL (ref 30.0–36.0)
MCV: 88.6 fL (ref 78.0–100.0)
Platelets: 67 10*3/uL — ABNORMAL LOW (ref 150–400)
RBC: 2.54 MIL/uL — ABNORMAL LOW (ref 3.87–5.11)
RDW: 14.1 % (ref 11.5–15.5)
WBC: 3 10*3/uL — ABNORMAL LOW (ref 4.0–10.5)

## 2014-08-26 LAB — BASIC METABOLIC PANEL
Anion gap: 9 (ref 5–15)
BUN: 11 mg/dL (ref 6–23)
CALCIUM: 8.1 mg/dL — AB (ref 8.4–10.5)
CO2: 26 mEq/L (ref 19–32)
CREATININE: 0.57 mg/dL (ref 0.50–1.10)
Chloride: 110 mEq/L (ref 96–112)
Glucose, Bld: 112 mg/dL — ABNORMAL HIGH (ref 70–99)
POTASSIUM: 3.9 meq/L (ref 3.7–5.3)
Sodium: 145 mEq/L (ref 137–147)

## 2014-08-26 NOTE — Progress Notes (Signed)
Physical Therapy Treatment Patient Details Name: [name] MRN: [id number] DOB: [birth year] Today's Date: 08/26/2014    History of Present Illness Antibiotic spacer L hip and ORIF L femur s/p spiral fx     PT Comments    [facility]  Follow Up Recommendations  SNF     Equipment Recommendations  None recommended by PT    Recommendations for Other Services OT consult     Precautions / Restrictions Precautions Precautions: Posterior Hip;Fall Precaution Comments: reminded pt of posterior hip precautions Restrictions Weight Bearing Restrictions: Yes LLE Weight Bearing: Touchdown weight bearing    Mobility  Bed Mobility Overal bed mobility: Needs Assistance;+2 for physical assistance Bed Mobility: Supine to Sit;Sit to Supine     Supine to sit: Mod assist;+2 for physical assistance Sit to supine: Mod assist;+2 for physical assistance   General bed mobility comments: cues for sequence and use of R LE to self assist  Transfers Overall transfer level: Needs assistance Equipment used: Rolling walker (2 wheeled) Transfers: Sit to/from Stand Sit to Stand: Min assist;Mod assist;+2 physical assistance;+2 safety/equipment Stand pivot transfers: Min assist;Mod assist;+2 physical assistance;+2 safety/equipment       General transfer comment: cues for LE management and use of UEs to self assist and [facility] and [facility].  Pt unable to step with R LE but shuffles to accomplish task  Ambulation/Gait Ambulation/Gait assistance: Min assist;Mod assist;+2 safety/equipment;+2 physical assistance Ambulation Distance (Feet): 3 Feet Assistive device: Rolling walker (2 wheeled) Gait Pattern/deviations: Shuffle Gait velocity: decr   General Gait Details: Pt shuffled sideways with RW to move up side of bed.    Stairs            Wheelchair Mobility    Modified Rankin (Stroke Patients Only)       Balance                                     Cognition Arousal/Alertness: Awake/alert Behavior During Therapy: Anxious Overall Cognitive Status: Within Functional Limits for tasks assessed                      Exercises Total Joint Exercises Ankle Circles/Pumps: AROM;Both;15 reps;Supine Quad Sets: AROM;Both;10 reps;Supine Heel Slides: AAROM;Left;Supine;15 reps Hip ABduction/ADduction: AAROM;Left;10 reps;Supine    General Comments        Pertinent Vitals/Pain Pain Assessment: Faces Faces Pain Scale: Hurts little more Pain Location: L hip Pain Descriptors / Indicators: Grimacing Pain Intervention(s): Monitored during session;Premedicated before session;Repositioned    Home Living Family/patient expects to be discharged to:: Skilled nursing facility Living Arrangements: Spouse/significant other                  Prior Function Level of Independence: Needs assistance  Gait / Transfers Assistance Needed: used power w/c for most mobility       PT Goals (current goals can now be found in the care plan section) Acute Rehab PT Goals Patient Stated Goal: [facility] at [facility] and return home PT Goal Formulation: With patient Time For Goal Achievement: 08/31/14 Potential to Achieve Goals: Good Progress towards PT goals: Progressing toward goals    Frequency  Min 5X/week    PT Plan Current plan remains appropriate    Co-evaluation PT/OT/SLP Co-Evaluation/Treatment: Yes Reason for Co-Treatment: For patient/therapist safety;Necessary to address cognition/behavior during functional activity PT goals addressed during session: Mobility/safety with  mobility;Strengthening/ROM;Proper use of DME OT goals addressed during session: ADL's and self-care     End of Session Equipment Utilized During Treatment: Gait belt;Left knee immobilizer Activity Tolerance: Patient tolerated treatment well Patient left: in bed;with call bell/phone within reach     Time: 1120-1222 PT  Time Calculation (min): 62 min  Charges:  $Therapeutic Exercise: 8-22 mins $Therapeutic Activity: 23-37 mins                    G Codes:      Natnael Biederman 08/26/2014, 3:04 PM

## 2014-08-26 NOTE — Progress Notes (Signed)
CARE MANAGEMENT NOTE 08/26/2014  [facility]  [facility]   Account Number:  [id number]  Date Initiated:  08/26/2014  Documentation initiated by:  [facility]Rihanna Marseille  Subjective/Objective Assessment:   INCISION AND DRAINAGE LEFT HIP     Action/Plan:   lives at home with husband   Anticipated DC Date:  08/27/2014   Anticipated DC Plan:  SKILLED NURSING FACILITY  In-house referral  Clinical Social Worker      DC Planning Services  CM consult      Choice offered to / List presented to:             Status of service:  In process, will continue to follow Medicare Important Message given?   (If response is "NO", the following Medicare IM given date fields will be blank) Date Medicare IM given:   Medicare IM given by:   Date Additional Medicare IM given:   Additional Medicare IM given by:    Discharge Disposition:    Per UR Regulation:    If discussed at Long Length of Stay Meetings, dates discussed:    Comments:  08/26/2014 1200 NCM spoke to pt and states he plans to go to SNF-rehab. She is concerned about IV abx if needed at SNF, states the price was costly at her last trip to SNF-rehab. NCM referral to CSW to follow up on IV abx at SNF. She is requesting hospital bed. Explained SNF will order hospital bed after dc for facility. She has an [employer] wheelchair but battery is not working. NCM instructed pt to follow up with [employer] to find out how she can replace battery and wheels. Benefit check for wheelchair. States she has new insurance and her old insurance paid for her other wheelchair. Isidoro DonningAlesia Loys Shugars RN CCM Case Mgmt phone [phone]

## 2014-08-26 NOTE — Evaluation (Signed)
Occupational Therapy Evaluation Patient Details Name: [name] MRN: [id number] DOB: [birth year] Today's Date: 08/26/2014    History of Present Illness Antibiotic spacer L hip and ORIF L femur s/p spiral fx    Clinical Impression   Pt has a long history of difficulty with her L hip which included a 2 month stay in another hospital system.  Pt was primarily using a w/c for mobility prior to this admission.   Pt is very familiar with posterior hip precautions and walker use, but her anxiety requires that she have step by step instruction and extra time.  Plan is for d/c to SNF, but unclear the length of her stay this hospitalization.  Will follow acutely.  Follow Up Recommendations  SNF;Supervision/Assistance - 24 hour    Equipment Recommendations       Recommendations for Other Services       Precautions / Restrictions Precautions Precautions: Posterior Hip;Fall Precaution Comments: reminded pt of posterior hip precautions Restrictions Weight Bearing Restrictions: Yes LLE Weight Bearing: Touchdown weight bearing      Mobility Bed Mobility                  Transfers                      Balance                                            ADL Overall ADL's : Needs assistance/impaired Eating/Feeding: Independent;Sitting   Grooming: Set up;Sitting   Upper Body Bathing: Set up;Sitting   Lower Body Bathing: +2 for physical assistance;Total assistance;Sit to/from stand   Upper Body Dressing : Set up;Sitting   Lower Body Dressing: +2 for physical assistance;Total assistance;Sit to/from stand   Toilet Transfer: +2 for physical assistance;Minimal assistance;Stand-pivot;BSC   Toileting- Clothing Manipulation and Hygiene: Total assistance;+2 for physical assistance;Sit to/from stand         General ADL Comments: Issued pt a long reacher to minimize her constant need to call nursing staff for help.     Vision                     Perception     Praxis      Pertinent Vitals/Pain Pain Assessment: Faces Faces Pain Scale: Hurts little more Pain Location: L hip Pain Descriptors / Indicators: Grimacing Pain Intervention(s): Monitored during session;Premedicated before session;Repositioned     Hand Dominance Right   Extremity/Trunk Assessment Upper Extremity Assessment Upper Extremity Assessment: Generalized weakness (recent hand surgery for trigger finger)   Lower Extremity Assessment Lower Extremity Assessment: Defer to PT evaluation       Communication Communication Communication: No difficulties   Cognition Arousal/Alertness: Awake/alert Behavior During Therapy: Anxious Overall Cognitive Status: Within Functional Limits for tasks assessed                     General Comments       Exercises       Shoulder Instructions      Home Living Family/patient expects to be discharged to:: Skilled nursing facility Living Arrangements: Spouse/significant other                                      Prior Functioning/Environment Level of Independence: Needs assistance  Gait / Transfers Assistance Needed: used power w/c for most mobility          OT Diagnosis: Generalized weakness;Acute pain   OT Problem List: Decreased strength;Decreased activity tolerance;Impaired balance (sitting and/or standing);Decreased knowledge of use of DME or AE;Pain   OT Treatment/Interventions: Self-care/ADL training;DME and/or AE instruction;Therapeutic activities;Patient/family education    OT Goals(Current goals can be found in the care plan section) Acute Rehab OT Goals Patient Stated Goal: [facility] and return home OT Goal Formulation: With patient Time For Goal Achievement: 09/09/14 Potential to Achieve Goals: Good ADL Goals Pt Will Perform Lower Body Bathing: with min assist;with adaptive equipment;sit to/from stand Pt Will Perform Lower Body Dressing: with min  assist;with adaptive equipment;sit to/from stand Pt Will Transfer to Toilet: with min assist;stand pivot transfer;bedside commode Pt Will Perform Toileting - Clothing Manipulation and hygiene: with min assist;sit to/from stand  OT Frequency: Min 2X/week   Barriers to D/C:            Co-evaluation PT/OT/SLP Co-Evaluation/Treatment: Yes Reason for Co-Treatment: For patient/therapist safety;Necessary to address cognition/behavior during functional activity   OT goals addressed during session: ADL's and self-care      End of Session    Activity Tolerance: Patient tolerated treatment well Patient left: in bed;with call bell/phone within reach (PT in room)   Time: [id number] OT Time Calculation (min): 34 min Charges:  OT General Charges $OT Visit: 1 Procedure OT Evaluation $Initial OT Evaluation Tier I: 1 Procedure G-Codes:    Evern BioMayberry, Seona Clemenson Lynn 08/26/2014, 12:30 PM  [phone]

## 2014-08-26 NOTE — Progress Notes (Signed)
Subjective: Doing ok.  Pain controlled.     Objective: Vital signs in last 24 hours: Temp:  [98.3 F (36.8 C)-99.1 F (37.3 C)] 98.5 F (36.9 C) (11/01 0540) Pulse Rate:  [86-92] 86 (11/01 0540) Resp:  [14-20] 20 (11/01 1124) BP: (100-126)/(44-60) 120/55 mmHg (11/01 0540) SpO2:  [93 %-97 %] 97 % (11/01 1124)  Intake/Output from previous day: 10/31 0701 - 11/01 0700 In: 1260 [P.O.:1140; I.V.:120] Out: 3510 [Urine:3400; Drains:110] Intake/Output this shift: Total I/O In: 480 [P.O.:480] Out: -    Recent Labs  08/24/14 0921 08/24/14 1022 08/24/14 1614 08/25/14 0445 08/26/14 0523  HGB 7.5* 9.2* 11.6* 9.9* 7.7*    Recent Labs  08/25/14 0445 08/26/14 0523  WBC 4.6 3.0*  RBC 3.24* 2.54*  HCT 28.4* 22.5*  PLT 92* 67*    Recent Labs  08/25/14 0445 08/26/14 0523  NA 141 145  K 3.8 3.9  CL 106 110  CO2 26 26  BUN 10 11  CREATININE 0.63 0.57  GLUCOSE 109* 112*  CALCIUM 7.7* 8.1*   No results for input(s): LABPT, INR in the last 72 hours.  Exam:  Alert and oriented.  Dressing C/D/I.  Drain removed.   Assessment/Plan: Continue present care.  Dr Lequita HaltAluisio to follow in AM.    Zonia KiefWENS,Jayce Boyko M 08/26/2014, 12:34 PM

## 2014-08-27 ENCOUNTER — Inpatient Hospital Stay (HOSPITAL_COMMUNITY): Payer: Medicare Other

## 2014-08-27 ENCOUNTER — Encounter (HOSPITAL_COMMUNITY): Payer: Self-pay | Admitting: Orthopedic Surgery

## 2014-08-27 DIAGNOSIS — M25559 Pain in unspecified hip: Secondary | ICD-10-CM

## 2014-08-27 DIAGNOSIS — T8450XA Infection and inflammatory reaction due to unspecified internal joint prosthesis, initial encounter: Secondary | ICD-10-CM

## 2014-08-27 LAB — BODY FLUID CULTURE: CULTURE: NO GROWTH

## 2014-08-27 LAB — CBC
HCT: 22.6 % — ABNORMAL LOW (ref 36.0–46.0)
Hemoglobin: 7.8 g/dL — ABNORMAL LOW (ref 12.0–15.0)
MCH: 31.3 pg (ref 26.0–34.0)
MCHC: 34.5 g/dL (ref 30.0–36.0)
MCV: 90.8 fL (ref 78.0–100.0)
Platelets: 72 10*3/uL — ABNORMAL LOW (ref 150–400)
RBC: 2.49 MIL/uL — ABNORMAL LOW (ref 3.87–5.11)
RDW: 14 % (ref 11.5–15.5)
WBC: 2.9 10*3/uL — ABNORMAL LOW (ref 4.0–10.5)

## 2014-08-27 MED ORDER — SODIUM CHLORIDE 0.9 % IJ SOLN
10.0000 mL | INTRAMUSCULAR | Status: DC | PRN
  Administered 2014-08-28 – 2014-08-29 (×3): 10 mL
  Filled 2014-08-27 (×3): qty 40

## 2014-08-27 MED ORDER — SODIUM CHLORIDE 0.9 % IJ SOLN
10.0000 mL | Freq: Two times a day (BID) | INTRAMUSCULAR | Status: DC
Start: 2014-08-27 — End: 2014-08-29

## 2014-08-27 NOTE — Progress Notes (Signed)
CSW assisting with d/c planning. [facility] has withdrew SNF bed offer due to cost of IV daptomycin. New SN[facility] search initiated. [facility] if stable for d/c. No other bed offers at this time, [facility] Liaison from SNF will meet with pt in the am to answer questions regarding facility / rehab .   Cori RazorJamie Chales Pelissier LCSW [phone]

## 2014-08-27 NOTE — Progress Notes (Signed)
Subjective: 3 Days Post-Op Procedure(s) (LRB): INCISION AND DRAINAGE LEFT HIP, EXCHANGE ANTIBIOTIC SPACER, OPEN REDUCTION LEFT FEMUR FRACTURE  (Left) Patient reports pain as mild.   Patient seen in rounds by Dr. Lequita HaltAluisio. Patient is well, but has had some minor complaints of pain in the hip, requiring pain medications Plan is to get PICC line today.  Objective: Vital signs in last 24 hours: Temp:  [98 F (36.7 C)-98.4 F (36.9 C)] 98 F (36.7 C) (11/02 0600) Pulse Rate:  [81-88] 86 (11/02 0600) Resp:  [16-20] 16 (11/02 0600) BP: (121-140)/(49-58) 140/58 mmHg (11/02 0600) SpO2:  [97 %-100 %] 97 % (11/02 0600)  Intake/Output from previous day:  Intake/Output Summary (Last 24 hours) at 08/27/14 0706 Last data filed at 08/27/14 0400  Gross per 24 hour  Intake   1560 ml  Output   1250 ml  Net    310 ml     Labs:  Recent Labs  08/24/14 1022 08/24/14 1614 08/25/14 0445 08/26/14 0523 08/27/14 0517  HGB 9.2* 11.6* 9.9* 7.7* 7.8*    Recent Labs  08/26/14 0523 08/27/14 0517  WBC 3.0* 2.9*  RBC 2.54* 2.49*  HCT 22.5* 22.6*  PLT 67* 72*    Recent Labs  08/25/14 0445 08/26/14 0523  NA 141 145  K 3.8 3.9  CL 106 110  CO2 26 26  BUN 10 11  CREATININE 0.63 0.57  GLUCOSE 109* 112*  CALCIUM 7.7* 8.1*   No results for input(s): LABPT, INR in the last 72 hours.  EXAM General - Patient is Alert and Appropriate Extremity - Neurovascular intact Sensation intact distally Dressing/Incision - clean, dry Motor Function - intact, moving foot and toes well on exam.   Past Medical History  Diagnosis Date  . Tenosynovitis of fingers     RIGHT RING AND SMALL-HAD SURGERY 07/12/14 [facility]  . Bronchitis     dx  07-04-2014 (approx) will finished antibiotic 07-14-2014  . History of concussion     x4  --  no residual  . Migraines   . Depression   . 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 GERD (gastroesophageal reflux disease)   . H/O hiatal hernia   . Chronic constipation   . Chronic narcotic use   . Nocturia   . SUI (stress urinary incontinence, female)   . OA (osteoarthritis)     hips, knees, hips  . Infection of left prosthetic hip joint     left total arthroplasty done 2012/  jan 2015 removal of prosthesis  . Full dentures   . History of cellulitis     lower extremities  . Thinning of skin     easily tears  . Venous stasis dermatitis     BILATERAL LEGS  . Productive cough     RESOLVED - QUIT SMOKING  . Memory problem   . OSA on CPAP     study x3   last one few yrs ago  cpap recommended -- DOES NOT USE OR HAVE CPAP    Assessment/Plan: 3 Days Post-Op Procedure(s) (LRB): INCISION AND DRAINAGE LEFT HIP, EXCHANGE ANTIBIOTIC SPACER, OPEN REDUCTION LEFT FEMUR FRACTURE  (Left) Principal Problem:   Septic arthritis of hip  Estimated body mass index is 34.29 kg/(m^2) as calculated from the following:   Height as of this encounter: 5\' 10"  (1.778 m).   Weight as  of this encounter: 108.41 kg (239 lb).  PICC line today  DVT Prophylaxis - Xarelto  Avel Peacerew Perkins, PA-C Orthopaedic Surgery 08/27/2014, 7:06 AM

## 2014-08-27 NOTE — Care Management Note (Addendum)
    Page 1 of 2   08/28/2014     3:19:19 PM CARE MANAGEMENT NOTE 08/28/2014  [name]   Account Number:  [id number]  Date Initiated:  08/26/2014  Documentation initiated by:  [name],[name]  Subjective/Objective Assessment:   INCISION AND DRAINAGE LEFT HIP     Action/Plan:   lives at home with husband   Anticipated DC Date:  08/27/2014   Anticipated DC Plan:  SKILLED NURSING FACILITY  In-house referral  Clinical Social Worker      DC Planning Services  CM consult      Choice offered to / List presented to:             Status of service:  Completed, signed off Medicare Important Message given?  YES (If response is "NO", the following Medicare IM given date fields will be blank) Date Medicare IM given:  08/28/2014 Medicare IM given by:  [name] Date Additional Medicare IM given:   Additional Medicare IM given by:    Discharge Disposition:  SKILLED NURSING FACILITY  Per UR Regulation:    If discussed at Long Length of Stay Meetings, dates discussed:    Comments:  08/28/14 15:15 CM notes pt to go to SNF for rehab; CSW arranging.   No other Cm needs were communicated.  [name], BSN, [place]CM [phone].  [id number]/[name] Earlene Plateravis, RN, BSN, CCM Chart reviewed. Discharge needs and patient's stay to be reviewed and followed by case manager. chart notes: Assessment/Plan: 1) hip joint infection with remote removal of prosthesis.  Noted inflammed tissue.  On daptomycin.  CK is elevated and to recheck tomorrow.  Hopefully is not much more elevated since options for treatment are limited.    I also will add rifampin 300 mg bid but with Xarelto for DVT prophylaxis will wait until that is stopped (at discharge?) She will need at least 6 weeks of daptomycin and continuation for about 6 months with oral Bactrim or doxycycline CSW: [name] AsalJamie Lee Haidinger, LCSW Social Worker Signed Social Services Progress Notes 08/27/2014  2:14 PM  Expand All Collapse All  CSW  assisting with d/c planning. [name] has withdrew SNF bed offer due to cost of IV daptomycin. New SNF search initiated. [facility] pt on [facility] if stable for d/c. No other bed offers at this time, [facility] Liaison from SNF will meet with pt in the am to answer questions regarding facility / rehab .  [name] RazorJamie Haidinger LCSW [phone]     08/26/2014 1200 NCM spoke to pt and states he plans to go to SNF-rehab. She is concerned about IV abx if needed at SNF, states the price was costly at her last trip to SNF-rehab. NCM referral to CSW to follow up on IV abx at SNF. She is requesting hospital bed. Explained SNF will order hospital bed after dc for facility. She has an [employer] wheelchair but battery is not working. NCM instructed pt to follow up with [employer] to find out how she can replace battery and wheels. Benefit check for wheelchair. States she has new insurance and her old insurance paid for her other wheelchair. [name] DonningAlesia Shavis RN CCM Case Mgmt phone [phone]

## 2014-08-27 NOTE — Progress Notes (Signed)
[facility] for Infectious Disease  Date of Admission:  08/24/2014  Antibiotics: daptomycin  Subjective: Some pain in hip  Objective: Temp:  [98 F (36.7 C)-98.2 F (36.8 C)] 98.2 F (36.8 C) (11/02 1500) Pulse Rate:  [81-89] 89 (11/02 1500) Resp:  [16-18] 18 (11/02 1500) BP: (127-140)/(49-58) 127/50 mmHg (11/02 1500) SpO2:  [95 %-98 %] 95 % (11/02 1500)  General: awake, alert, nad Skin: no rashes Lungs: CTA Cor: RRR Abdomen: soft, nt   Lab Results Lab Results  Component Value Date   WBC 2.9* 08/27/2014   HGB 7.8* 08/27/2014   HCT 22.6* 08/27/2014   MCV 90.8 08/27/2014   PLT 72* 08/27/2014    Lab Results  Component Value Date   CREATININE 0.57 08/26/2014   BUN 11 08/26/2014   NA 145 08/26/2014   K 3.9 08/26/2014   CL 110 08/26/2014   CO2 26 08/26/2014    Lab Results  Component Value Date   ALT 16 08/17/2014   AST 28 08/17/2014   ALKPHOS 106 08/17/2014   BILITOT 0.4 08/17/2014      Microbiology: Recent Results (from the past 240 hour(s))  Body fluid culture     Status: None   Collection Time: 08/24/14  8:09 AM  Result Value Ref Range Status   Specimen Description FLUID LEFT HIP  Final   Special Requests NONE  Final   Gram Stain   Final    MODERATE WBC PRESENT,BOTH PMN AND MONONUCLEAR NO ORGANISMS SEEN Gram Stain Report Called to,Read Back By and Verified With: Gram Stain Report Called to,Read Back By and Verified With: P.COTTA AT 0855 ON 08/24/14 BY SHUEA Performed by [facility] Performed at [facility]   Final    NO GROWTH 3 DAYS Performed at [employer]    Report Status 08/27/2014 FINAL  Final  Anaerobic culture     Status: None (Preliminary result)   Collection Time: 08/24/14  8:09 AM  Result Value Ref Range Status   Specimen Description FLUID LEFT HIP  Final   Special Requests NONE  Final   Gram Stain   Final    MODERATE WBC PRESENT,BOTH PMN AND MONONUCLEAR NO ORGANISMS SEEN Gram Stain  Report Called to,Read Back By and Verified With: Gram Stain Report Called to,Read Back By and Verified With: P.COTTA AT 0855 ON 08/24/14 BY SHUEA Performed by [facility] Performed at [facility]   Culture   Final    NO ANAEROBES ISOLATED; CULTURE IN PROGRESS FOR 5 DAYS Performed at [facility]   Report Status PENDING  Incomplete  Gram stain     Status: None   Collection Time: 08/24/14  8:09 AM  Result Value Ref Range Status   Specimen Description FLUID LEFT HIP  Final   Special Requests NONE  Final   Gram Stain   Final    MODERATE WBC PRESENT,BOTH PMN AND MONONUCLEAR NO ORGANISMS SEEN Gram Stain Report Called to,Read Back By and Verified With: Barbie Banner RN AT [phone] ON 10.30.15 BY SHUEA   Report Status 08/24/2014 FINAL  Final    Studies/Results: Ir Fluoro Guide Cv Line Right  08/27/2014   INDICATION: Septic arthritis of left hip; central venous access is requested for antibiotic therapy.  EXAM: ULTRASOUND AND FLUOROSCOPIC GUIDED PICC LINE INSERTION  MEDICATIONS: None.  CONTRAST:  None  FLUOROSCOPY TIME:  36 seconds.  COMPLICATIONS: None immediate  TECHNIQUE: The procedure, risks, benefits, and alternatives were explained to  the patient and informed written consent was obtained. A timeout was performed prior to the initiation of the procedure.  The right upper extremity was prepped with chlorhexidine in a sterile fashion, and a sterile drape was applied covering the operative field. Maximum barrier sterile technique with sterile gowns and gloves were used for the procedure. A timeout was performed prior to the initiation of the procedure. Local anesthesia was provided with 1% lidocaine.  Under direct ultrasound guidance, the right basilic vein was accessed with a micropuncture kit after the overlying soft tissues were anesthetized with 1% lidocaine. An ultrasound image was saved for documentation purposes. A guidewire was advanced to the level of the superior caval-atrial  junction for measurement purposes and the PICC line was cut to length. A peel-away sheath was placed and a 49 cm, 5 [place], dual lumen was inserted to level of the superior caval-atrial junction. A post procedure spot fluoroscopic was obtained. The catheter easily aspirated and flushed and was sutured in place. A dressing was placed. The patient tolerated the procedure well without immediate post procedural complication.  FINDINGS: After catheter placement, the tip lies within the superior cavoatrial junction. The catheter aspirates and flushes normally and is ready for immediate use.  IMPRESSION: Successful ultrasound and fluoroscopic guided placement of a right basilic vein approach, 49 cm, 5 French, dual lumen PICC with tip at the superior caval-atrial junction. The PICC line is ready for immediate use.  Read by: Rowe Robert, PA-C   Electronically Signed   By: Maryclare Bean M.D.   On: 08/27/2014 11:59   Ir US Guide Vasc Access Right  08/27/2014   INDICATION: Septic arthritis of left hip; central venous access is requested for antibiotic therapy.  EXAM: ULTRASOUND AND FLUOROSCOPIC GUIDED PICC LINE INSERTION  MEDICATIONS: None.  CONTRAST:  None  FLUOROSCOPY TIME:  36 seconds.  COMPLICATIONS: None immediate  TECHNIQUE: The procedure, risks, benefits, and alternatives were explained to the patient and informed written consent was obtained. A timeout was performed prior to the initiation of the procedure.  The right upper extremity was prepped with chlorhexidine in a sterile fashion, and a sterile drape was applied covering the operative field. Maximum barrier sterile technique with sterile gowns and gloves were used for the procedure. A timeout was performed prior to the initiation of the procedure. Local anesthesia was provided with 1% lidocaine.  Under direct ultrasound guidance, the right basilic vein was accessed with a micropuncture kit after the overlying soft tissues were anesthetized with 1% lidocaine. An  ultrasound image was saved for documentation purposes. A guidewire was advanced to the level of the superior caval-atrial junction for measurement purposes and the PICC line was cut to length. A peel-away sheath was placed and a 49 cm, 5 [place], dual lumen was inserted to level of the superior caval-atrial junction. A post procedure spot fluoroscopic was obtained. The catheter easily aspirated and flushed and was sutured in place. A dressing was placed. The patient tolerated the procedure well without immediate post procedural complication.  FINDINGS: After catheter placement, the tip lies within the superior cavoatrial junction. The catheter aspirates and flushes normally and is ready for immediate use.  IMPRESSION: Successful ultrasound and fluoroscopic guided placement of a right basilic vein approach, 49 cm, 5 French, dual lumen PICC with tip at the superior caval-atrial junction. The PICC line is ready for immediate use.  Read by: Rowe Robert, PA-C   Electronically Signed   By: Maryclare Bean M.D.   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  Noted inflammed tissue.  On daptomycin.  CK is elevated and to recheck tomorrow.  Hopefully is not much more elevated since options for treatment are limited.   I also will add rifampin 300 mg bid but with Xarelto for DVT prophylaxis will wait until that is stopped (at discharge?) She will need at least 6 weeks of daptomycin and continuation for about 6 months with oral Bactrim or doxycycline  Scharlene Gloss, [facility] Infectious [facility] [place] [email or web address] [phone] pager   [phone] cell 08/27/2014, 4:19 PM

## 2014-08-27 NOTE — Progress Notes (Signed)
PT Cancellation Note  Patient Details Name: [name] MRN: [id number] DOB: [birth year]   Cancelled Treatment:      Pt out of room in Radiology for PICC line placement   Felecia ShellingLori Pierce Barocio  PTA [facility]  [facility]hab Pager      [phone]

## 2014-08-27 NOTE — Plan of Care (Signed)
Problem: Phase II Progression Outcomes Goal: Discharge plan established Outcome: Completed/Met Date Met:  08/27/14

## 2014-08-27 NOTE — Procedures (Signed)
US/fluoroscopic guided right DL basilic vein PICC placement performed. Length 49 cm. Tip SVC/RA junction. No immediate complications. Ok to use.

## 2014-08-27 NOTE — Progress Notes (Signed)
ANTIBIOTIC CONSULT NOTE - Follow-up Pharmacy Consult for daptomycin Indication: infected prosthetic hip with potential osteomyelitis  Allergies  Allergen Reactions  . Vancomycin Other (See Comments)    LEUKOPENIA    Patient Measurements: Height: 5\' 10"  (177.8 cm) Weight: 239 lb (108.41 kg) IBW/kg (Calculated) : 68.5 Adjusted Body Weight: 84.5kg  Vital Signs: Temp: 98.2 F (36.8 C) (11/02 1500) Temp Source: Oral (11/02 1500) BP: 127/50 mmHg (11/02 1500) Pulse Rate: 89 (11/02 1500)  Labs:  Recent Labs  08/25/14 0445 08/26/14 0523 08/27/14 0517  WBC 4.6 3.0* 2.9*  HGB 9.9* 7.7* 7.8*  PLT 92* 67* 72*  CREATININE 0.63 0.57  --    Estimated Creatinine Clearance: 103.5 mL/min (by C-G formula based on Cr of 0.57).  Medical History: Past Medical History  Diagnosis Date  . Tenosynovitis of fingers     RIGHT RING AND SMALL-HAD SURGERY 07/12/14 [facility]  . Bronchitis     dx  07-04-2014 (approx) will finished antibiotic 07-14-2014  . History of concussion     x4  --  no residual  . Migraines   . Depression   . 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 GERD (gastroesophageal reflux disease)   . H/O hiatal hernia   . Chronic constipation   . Chronic narcotic use   . Nocturia   . SUI (stress urinary incontinence, female)   . OA (osteoarthritis)     hips, knees, hips  . Infection of left prosthetic hip joint     left total arthroplasty done 2012/  jan 2015 removal of prosthesis  . Full dentures   . History of cellulitis     lower extremities  . Thinning of skin     easily tears  . Venous stasis dermatitis     BILATERAL LEGS  . Productive cough     RESOLVED - QUIT SMOKING  . Memory problem   . OSA on CPAP     study x3   last one few yrs ago  cpap recommended -- DOES NOT USE OR HAVE CPAP    Medications:  Scheduled:  . citalopram  40 mg Oral q morning - 10a  .  DAPTOmycin (CUBICIN)  IV  650 mg Intravenous Q2000  . docusate sodium  100 mg Oral BID  . gabapentin  400 mg Oral BID WC  . gabapentin  800 mg Oral QHS  . Naloxegol Oxalate  25 mg Oral QHS  . oxymorphone  20 mg Oral Q12H  . pantoprazole  40 mg Oral Daily  . rivaroxaban  10 mg Oral Q breakfast  . traZODone  150-300 mg Oral QHS   Infusions:  . sodium chloride 75 mL/hr at 08/27/14 1105   Assessment: [age] yoF with complications of internal orthopedic device of left hip (including multiple surgeries and prolonged courses of antibiotics since 2011- see Dr. [name] Dam's consult note from 10/30 for details) now s/p left total hip reimplantation 10/30. Pharmacy originally consulted to dose vancomycin post-op for septic arthritis hip, now being changed to daptomycin per Pharmacy.   Antiinfectives 10/30 >> Vanc x1 10/30 >> daptomycin >>  Labs / vitals Tmax: afebrile WBC: 2.9 Renal: SCr 0.57 (11/1), CrCl > 100 mL/min CG, CrCl ~ 88 mL/min N  Microbiology 10/30:  Fluid from left hip sent for culture. No organisms seen.   10/30: D1 daptomycin 650mg  daily (6mg /kg TBW) for  septic arthritic hip. S/p 1 dose vancomycin 2250mg . Changed to daptomycin by infectious diseases MD due to leukopenia with vancomycin.   Goal of Therapy:  daptomycin per indication and renal function  Plan:  - Continue daptomycin 650mg  IV q24h - Re-check CK in AM to follow trend (elevated on 10/31).  Continue qWeek CK monitoring while on daptomycin therapy. - Pharmacy will follow-up daily  Thank you for the consult.  Greer PickerelJigna Muadh Creasy, PharmD, BCPS Pager: [phone] 08/27/2014 3:25 PM

## 2014-08-28 LAB — TYPE AND SCREEN
ABO/RH(D): O POS
ANTIBODY SCREEN: POSITIVE
DAT, IgG: NEGATIVE
DONOR AG TYPE: NEGATIVE
Donor AG Type: NEGATIVE
Donor AG Type: NEGATIVE
Donor AG Type: NEGATIVE
Donor AG Type: NEGATIVE
Donor AG Type: NEGATIVE
PT AG Type: NEGATIVE
UNIT DIVISION: 0
UNIT DIVISION: 0
Unit division: 0
Unit division: 0
Unit division: 0
Unit division: 0

## 2014-08-28 LAB — CBC
HCT: 21.1 % — ABNORMAL LOW (ref 36.0–46.0)
HEMOGLOBIN: 7.3 g/dL — AB (ref 12.0–15.0)
MCH: 31.5 pg (ref 26.0–34.0)
MCHC: 34.6 g/dL (ref 30.0–36.0)
MCV: 90.9 fL (ref 78.0–100.0)
Platelets: 79 10*3/uL — ABNORMAL LOW (ref 150–400)
RBC: 2.32 MIL/uL — AB (ref 3.87–5.11)
RDW: 14.1 % (ref 11.5–15.5)
WBC: 2.7 10*3/uL — ABNORMAL LOW (ref 4.0–10.5)

## 2014-08-28 LAB — CK: CK TOTAL: 145 U/L (ref 7–177)

## 2014-08-28 LAB — PREPARE RBC (CROSSMATCH)

## 2014-08-28 MED ORDER — SODIUM CHLORIDE 0.9 % IV SOLN
Freq: Once | INTRAVENOUS | Status: AC
  Administered 2014-08-28: 04:00:00 via INTRAVENOUS

## 2014-08-28 MED ORDER — FUROSEMIDE 10 MG/ML IJ SOLN
10.0000 mg | Freq: Once | INTRAMUSCULAR | Status: AC
  Administered 2014-08-28: 10 mg via INTRAVENOUS
  Filled 2014-08-28: qty 1

## 2014-08-28 NOTE — Progress Notes (Signed)
Physical Therapy Treatment Patient Details Name: [name] MRN: [id number] DOB: 10-03-1956 Today's Date: 08/28/2014    History of Present Illness Antibiotic spacer L hip and ORIF L femur s/p spiral fx     PT Comments    Pt scheduled to receive 2 units. Performed TE's only as [facility]  Follow Up Recommendations  SNF     Equipment Recommendations       Recommendations for Other Services       Precautions / Restrictions Precautions Precautions: Posterior Hip;Fall Precaution Comments: THR Restrictions Weight Bearing Restrictions: Yes LLE Weight Bearing: Touchdown weight bearing    Mobility  Bed Mobility                  Transfers                    Ambulation/Gait                 Stairs            Wheelchair Mobility    Modified Rankin (Stroke Patients Only)       Balance                                    Cognition                            Exercises Total Joint Exercises Ankle Circles/Pumps: AROM;Both;Supine;20 reps Quad Sets: AROM;Both;10 reps;Supine Gluteal Sets: Both;10 reps;Supine Towel Squeeze: Both;10 reps;Supine Short Arc Quad: Left;10 reps;Supine;AAROM Heel Slides: AAROM;Left;10 reps;Supine Hip ABduction/ADduction: Left;10 reps;Supine Straight Leg Raises: Left;10 reps;Supine    General Comments        Pertinent Vitals/Pain Pain Assessment: 0-10 Pain Score: 8  Pain Location: L hip Pain Descriptors / Indicators: Constant;Burning;Sore;Throbbing;Tender Pain Intervention(s): Monitored during session;Premedicated before session;Repositioned    Home Living                      Prior Function            PT Goals (current goals can now be found in the care plan section) Progress towards PT goals: Progressing toward goals    Frequency  Min 5X/week    PT Plan Current plan remains appropriate    Co-evaluation              End of Session           Time: [id number] PT Time Calculation (min): 16 min  Charges:  $Therapeutic Exercise: 8-22 mins                    G Codes:      Felecia ShellingLori Carrine Kroboth  PTA [facility]  Acute  Rehab Pager      [phone]

## 2014-08-28 NOTE — Progress Notes (Signed)
Subjective: 4 Days Post-Op Procedure(s) (LRB): INCISION AND DRAINAGE LEFT HIP, EXCHANGE ANTIBIOTIC SPACER, OPEN REDUCTION LEFT FEMUR FRACTURE  (Left) Patient reports pain as mild and moderate.   Patient seen in rounds with Dr. Lequita HaltAluisio.  Patient is well, but has had some minor complaints of pain in the hip, requiring pain medications  White count okay, platelets improving,  Sed Rate was 17 the other day. HGB is down to 7.3.  Discussed blood and will give two units today.  Hopefully to SNF tomorrow if doing better with blood and arrangements finalized. Plan is to go Skilled nursing facility after hospital stay.  Objective: Vital signs in last 24 hours: Temp:  [98.2 F (36.8 C)] 98.2 F (36.8 C) (11/02 1500) Pulse Rate:  [89] 89 (11/02 1500) Resp:  [17-18] 18 (11/03 0313) BP: (127)/(50) 127/50 mmHg (11/02 1500) SpO2:  [95 %] 95 % (11/03 0313)  Intake/Output from previous day:  Intake/Output Summary (Last 24 hours) at 08/28/14 0840 Last data filed at 08/28/14 0629  Gross per 24 hour  Intake 2058.33 ml  Output   1450 ml  Net 608.33 ml    Labs:  Recent Labs  08/26/14 0523 08/27/14 0517 08/28/14 0430  HGB 7.7* 7.8* 7.3*    Recent Labs  08/27/14 0517 08/28/14 0430  WBC 2.9* 2.7*  RBC 2.49* 2.32*  HCT 22.6* 21.1*  PLT 72* 79*    Recent Labs  08/26/14 0523  NA 145  K 3.9  CL 110  CO2 26  BUN 11  CREATININE 0.57  GLUCOSE 112*  CALCIUM 8.1*   No results for input(s): LABPT, INR in the last 72 hours.  EXAM General - Patient is Alert and Appropriate Extremity - Neurovascular intact Sensation intact distally Dorsiflexion/Plantar flexion intact Dressing/Incision - clean, dry, no drainage, staples intact, no cellulitic changes Motor Function - intact, moving foot and toes well on exam.   Past Medical History  Diagnosis Date  . Tenosynovitis of fingers     RIGHT RING AND SMALL-HAD SURGERY 07/12/14 [facility]  . Bronchitis     dx   07-04-2014 (approx) will finished antibiotic 07-14-2014  . History of concussion     x4  --  no residual  . Migraines   . Depression   . 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 GERD (gastroesophageal reflux disease)   . H/O hiatal hernia   . Chronic constipation   . Chronic narcotic use   . Nocturia   . SUI (stress urinary incontinence, female)   . OA (osteoarthritis)     hips, knees, hips  . Infection of left prosthetic hip joint     left total arthroplasty done 2012/  jan 2015 removal of prosthesis  . Full dentures   . History of cellulitis     lower extremities  . Thinning of skin     easily tears  . Venous stasis dermatitis     BILATERAL LEGS  . Productive cough     RESOLVED - QUIT SMOKING  . Memory problem   . OSA on CPAP     study x3   last one few yrs ago  cpap recommended -- DOES NOT USE OR HAVE CPAP    Assessment/Plan: 4 Days Post-Op Procedure(s) (LRB): INCISION AND DRAINAGE LEFT HIP, EXCHANGE ANTIBIOTIC SPACER, OPEN REDUCTION LEFT FEMUR FRACTURE  (Left) Principal Problem:   Septic  arthritis of hip  Estimated body mass index is 34.29 kg/(m^2) as calculated from the following:   Height as of this encounter: 5\' 10"  (1.778 m).   Weight as of this encounter: 108.41 kg (239 lb). Up with therapy Plan for discharge tomorrow Discharge to SNF  DVT Prophylaxis - Xarelto for a total off three weeks from day of srugery.  Rifampin has been recommended but is being held off until Xarelto is completed.  Once she has completed the Xarelto, will add the Rifampin 300 mg BID. Total of six weeks of Daptomycin and then six months of oral ABXs. Touch Down Weight Bearing left leg  Avel Peacerew Emmilia Sowder, PA-C Orthopaedic Surgery 08/28/2014, 8:40 AM

## 2014-08-29 ENCOUNTER — Inpatient Hospital Stay (HOSPITAL_COMMUNITY): Payer: Medicare Other

## 2014-08-29 ENCOUNTER — Non-Acute Institutional Stay (SKILLED_NURSING_FACILITY): Payer: Medicare Other | Admitting: Internal Medicine

## 2014-08-29 DIAGNOSIS — R29898 Other symptoms and signs involving the musculoskeletal system: Secondary | ICD-10-CM

## 2014-08-29 DIAGNOSIS — M009 Pyogenic arthritis, unspecified: Secondary | ICD-10-CM

## 2014-08-29 DIAGNOSIS — S7292XP Unspecified fracture of left femur, subsequent encounter for closed fracture with malunion: Secondary | ICD-10-CM

## 2014-08-29 LAB — CBC
HCT: 26.8 % — ABNORMAL LOW (ref 36.0–46.0)
HEMOGLOBIN: 9.2 g/dL — AB (ref 12.0–15.0)
MCH: 30.4 pg (ref 26.0–34.0)
MCHC: 34.3 g/dL (ref 30.0–36.0)
MCV: 88.4 fL (ref 78.0–100.0)
Platelets: 104 10*3/uL — ABNORMAL LOW (ref 150–400)
RBC: 3.03 MIL/uL — AB (ref 3.87–5.11)
RDW: 14.4 % (ref 11.5–15.5)
WBC: 3.5 10*3/uL — ABNORMAL LOW (ref 4.0–10.5)

## 2014-08-29 LAB — BASIC METABOLIC PANEL
Anion gap: 11 (ref 5–15)
BUN: 10 mg/dL (ref 6–23)
CALCIUM: 8.8 mg/dL (ref 8.4–10.5)
CO2: 27 meq/L (ref 19–32)
Chloride: 101 mEq/L (ref 96–112)
Creatinine, Ser: 0.6 mg/dL (ref 0.50–1.10)
GFR calc Af Amer: >90 mL/min (ref >90)
GFR calc non Af Amer: >90 mL/min (ref >90)
GLUCOSE: 100 mg/dL — AB (ref 70–99)
Potassium: 3.8 mEq/L (ref 3.7–5.3)
SODIUM: 139 meq/L (ref 137–147)

## 2014-08-29 LAB — TYPE AND SCREEN
ABO/RH(D): O POS
Antibody Screen: POSITIVE
DAT, IgG: NEGATIVE
Donor AG Type: NEGATIVE
Donor AG Type: NEGATIVE
UNIT DIVISION: 0
Unit division: 0

## 2014-08-29 LAB — ANAEROBIC CULTURE

## 2014-08-29 MED ORDER — METOCLOPRAMIDE HCL 5 MG PO TABS: 5.0000 mg | ORAL_TABLET | Freq: Three times a day (TID) | ORAL | Status: DC | PRN

## 2014-08-29 MED ORDER — SODIUM CHLORIDE 0.9 % IV SOLN: 650.0000 mg | INTRAVENOUS | Status: DC

## 2014-08-29 MED ORDER — ONDANSETRON HCL 4 MG PO TABS: 4.0000 mg | ORAL_TABLET | Freq: Four times a day (QID) | ORAL | Status: DC | PRN

## 2014-08-29 MED ORDER — RIVAROXABAN 10 MG PO TABS: 10.0000 mg | ORAL_TABLET | Freq: Every day | ORAL | Status: DC

## 2014-08-29 MED ORDER — METHOCARBAMOL 500 MG PO TABS: 500.0000 mg | ORAL_TABLET | Freq: Four times a day (QID) | ORAL | Status: DC | PRN

## 2014-08-29 MED ORDER — ACETAMINOPHEN 325 MG PO TABS: 650.0000 mg | ORAL_TABLET | Freq: Four times a day (QID) | ORAL | Status: AC | PRN

## 2014-08-29 MED ORDER — BISACODYL 10 MG RE SUPP: 10.0000 mg | Freq: Every day | RECTAL | Status: DC | PRN

## 2014-08-29 MED ORDER — OXYCODONE HCL 10 MG PO TABS: 10.0000 mg | ORAL_TABLET | ORAL | Status: DC | PRN

## 2014-08-29 MED ORDER — RIFAMPIN 300 MG PO CAPS: 300.0000 mg | ORAL_CAPSULE | Freq: Two times a day (BID) | ORAL | Status: DC

## 2014-08-29 MED ORDER — POLYETHYLENE GLYCOL 3350 17 G PO PACK: 17.0000 g | PACK | Freq: Every day | ORAL | Status: DC | PRN

## 2014-08-29 MED ORDER — HEPARIN SOD (PORK) LOCK FLUSH 100 UNIT/ML IV SOLN
250.0000 [IU] | INTRAVENOUS | Status: AC | PRN
  Administered 2014-08-29: 250 [IU]

## 2014-08-29 MED ORDER — OXYMORPHONE HCL ER 20 MG PO TB12: 20.0000 mg | ORAL_TABLET | Freq: Two times a day (BID) | ORAL | Status: DC

## 2014-08-29 NOTE — Progress Notes (Signed)
Clinical Social Work Department CLINICAL SOCIAL WORK PLACEMENT NOTE 08/29/2014  [name]  Account Number:  [id number] Admit date:  08/24/2014  Clinical Social Worker:  Cori RazorJAMIE Prudencio Velazco, LCSW  Date/time:  08/24/2014 02:55 PM  Clinical Social Work is seeking post-discharge placement for this patient at the following level of care:   SKILLED NURSING   (*CSW will update this form in Epic as items are completed)     Patient/family provided with [facility] Department of Clinical Social Work's list of facilities offering this level of care within the geographic area requested by the patient (or if unable, by the patient's family).  08/24/2014  Patient/family informed of their freedom to choose among providers that offer the needed level of care, that participate in Medicare, Medicaid or managed care program needed by the patient, have an available bed and are willing to accept the patient.    Patient/family informed of [facility]' ownership interest in [facility], as well as of the fact that they are under no obligation to receive care at this facility.  PASARR submitted to EDS on 08/24/2014 PASARR number received on 08/24/2014  FL2 transmitted to all facilities in geographic area requested by pt/family on  08/24/2014 FL2 transmitted to all facilities within larger geographic area on   Patient informed that his/her managed care company has contracts with or will negotiate with  certain facilities, including the following:     Patient/family informed of bed offers received:  08/24/2014 [facility] [place] Physician recommends and patient chooses bed at    [facility] [place] on  08/29/2014 Patient to be transferred to facility by [facility] Patient and family notified of transfer on 08/29/2014 Name of family member notified:  Pt declined csw assistance  The following physician request  were entered in Epic:   Additional Comments: Pt is in agreement with d/c to SNF today. [facility] transport required. NSG reviewed d/c summary, scripts, avs. Scripts included in d/c packet.  Cori RazorJamie Daine Croker LCSW [phone] Clinical Social Work Department CLINICAL SOCIAL WORK PLACEMENT NOTE 08/29/2014  [name]  Account Number:  [id number] Admit date:  08/24/2014  Clinical Social Worker:  Cori RazorJAMIE Jordynne Mccown, LCSW  Date/time:  08/24/2014 02:55 PM  Clinical Social Work is seeking post-discharge placement for this patient at the following level of care:   SKILLED NURSING   (*CSW will update this form in Epic as items are completed)     Patient/family provided with [facility] Department of Clinical Social Work's list of facilities offering this level of care within the geographic area requested by the patient (or if unable, by the patient's family).  08/24/2014  Patient/family informed of their freedom to choose among providers that offer the needed level of care, that participate in Medicare, Medicaid or managed care program needed by the patient, have an available bed and are willing to accept the patient.    Patient/family informed of [facility]' ownership interest in [facility], as well as of the fact that they are under no obligation to receive care at this facility.  PASARR submitted to EDS on 08/24/2014 PASARR number received on 08/24/2014  FL2 transmitted to all facilities in geographic area requested by pt/family on  08/24/2014 FL2 transmitted to all facilities within larger geographic area on   Patient informed that his/her managed care company has contracts with or will negotiate with  certain facilities, including the following:  Patient/family informed of bed offers received:  08/24/2014 [facility] [place] Physician recommends and patient chooses bed at    [facility]  [place] on  08/29/2014 Patient to be transferred to facility by [facility] Patient and family notified of transfer on 08/29/2014 Name of family member notified:  Pt declined csw assistance  The following physician request were entered in Epic:   Additional Comments: Pt is in agreement with d/c to SNF today. [facility] transport required. NSG reviewed d/c summary, scripts, avs. Scripts included in d/c packet.  Cori RazorJamie Madeline Pho LCSW [phone]

## 2014-08-29 NOTE — Discharge Summary (Signed)
Physician Discharge Summary   Patient ID: [name] MRN: [id number] DOB/AGE: [birth year] 56 y.o.  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 Admission Diagnoses:  Past Medical History  Diagnosis Date  . Tenosynovitis of fingers     RIGHT RING AND SMALL-HAD SURGERY 07/12/14 [facility]  . Bronchitis     dx  07-04-2014 (approx) will finished antibiotic 07-14-2014  . History of concussion     x4  --  no residual  . Migraines   . Depression   . 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 GERD (gastroesophageal reflux disease)   . H/O hiatal hernia   . Chronic constipation   . Chronic narcotic use   . Nocturia   . SUI (stress urinary incontinence, female)   . OA (osteoarthritis)     hips, knees, hips  . Infection of left prosthetic hip joint     left total arthroplasty done 2012/  jan 2015 removal of prosthesis  . Full dentures   . History of cellulitis     lower extremities  . Thinning of skin     easily tears  . Venous stasis dermatitis     BILATERAL LEGS  . Productive cough     RESOLVED - QUIT SMOKING  . Memory problem   . OSA on CPAP     study x3   last one few yrs ago  cpap recommended -- DOES NOT USE OR HAVE CPAP   Discharge Diagnoses:   Principal Problem:   Septic arthritis of hip  Estimated body mass index is 34.29 kg/(m^2) as calculated from the following:   Height as of this encounter: 5' 10"  (1.778 m).   Weight as of this encounter: 108.41 kg (239 lb).  Procedure(s) (LRB): INCISION AND DRAINAGE LEFT HIP, EXCHANGE ANTIBIOTIC SPACER, OPEN REDUCTION LEFT FEMUR FRACTURE  (Left)   Consults: ID  HPI: [name] is a 58 year old female, long complex history in regards to her left hip. She has had multiple surgeries at different institutions. She most recently had a  resection arthroplasty, antibiotic spacer from Dr. Rush Landmark Ward at [facility] in [place]. She had been cleared by Infectious Disease for reimplantation and presented to [place] on second opinion for consideration of reimplantation. She presents today for possible reimplantation of her left total hip arthroplasty. Laboratory Data: Admission on 08/24/2014  No results displayed because visit has over 200 results.    Hospital Outpatient Visit on 08/17/2014  Component Date Value Ref Range Status  . aPTT 08/17/2014 34  24 - 37 seconds Final  . WBC 08/17/2014 2.6* 4.0 - 10.5 K/uL Final  . RBC 08/17/2014 3.73* 3.87 - 5.11 MIL/uL Final  . Hemoglobin 08/17/2014 11.7* 12.0 - 15.0 g/dL Final  . HCT 08/17/2014 33.3* 36.0 - 46.0 % Final  . MCV 08/17/2014 89.3  78.0 - 100.0 fL Final  . MCH 08/17/2014 31.4  26.0 - 34.0 pg Final  . MCHC 08/17/2014 35.1  30.0 - 36.0 g/dL Final  . RDW 08/17/2014 13.5  11.5 - 15.5 % Final  . Platelets 08/17/2014 100* 150 - 400 K/uL Final   Comment: SPECIMEN CHECKED FOR CLOTS                          PLATELET COUNT CONFIRMED BY SMEAR  .  Sodium 08/17/2014 136* 137 - 147 mEq/L Final  . Potassium 08/17/2014 4.6  3.7 - 5.3 mEq/L Final  . Chloride 08/17/2014 99  96 - 112 mEq/L Final  . CO2 08/17/2014 27  19 - 32 mEq/L Final  . Glucose, Bld 08/17/2014 98  70 - 99 mg/dL Final  . BUN 08/17/2014 12  6 - 23 mg/dL Final  . Creatinine, Ser 08/17/2014 0.84  0.50 - 1.10 mg/dL Final  . Calcium 08/17/2014 9.4  8.4 - 10.5 mg/dL Final  . Total Protein 08/17/2014 7.5  6.0 - 8.3 g/dL Final  . Albumin 08/17/2014 3.7  3.5 - 5.2 g/dL Final  . AST 08/17/2014 28  0 - 37 U/L Final  . ALT 08/17/2014 16  0 - 35 U/L Final  . Alkaline Phosphatase 08/17/2014 106  39 - 117 U/L Final  . Total Bilirubin 08/17/2014 0.4  0.3 - 1.2 mg/dL Final  . GFR calc non Af Amer 08/17/2014 76* >90 mL/min Final  . GFR calc Af Amer 08/17/2014 88* >90 mL/min Final   Comment: (NOTE)                           The eGFR has been calculated using the CKD EPI equation.                          This calculation has not been validated in all clinical situations.                          eGFR's persistently <90 mL/min signify possible Chronic Kidney                          Disease.  . Anion gap 08/17/2014 10  5 - 15 Final  . Prothrombin Time 08/17/2014 13.8  11.6 - 15.2 seconds Final  . INR 08/17/2014 1.05  0.00 - 1.49 Final  . Color, Urine 08/17/2014 AMBER* YELLOW Final   BIOCHEMICALS MAY BE AFFECTED BY COLOR  . APPearance 08/17/2014 CLEAR  CLEAR Final  . Specific Gravity, Urine 08/17/2014 1.018  1.005 - 1.030 Final  . pH 08/17/2014 5.5  5.0 - 8.0 Final  . Glucose, UA 08/17/2014 NEGATIVE  NEGATIVE mg/dL Final  . Hgb urine dipstick 08/17/2014 NEGATIVE  NEGATIVE Final  . Bilirubin Urine 08/17/2014 NEGATIVE  NEGATIVE Final  . Ketones, ur 08/17/2014 NEGATIVE  NEGATIVE mg/dL Final  . Protein, ur 08/17/2014 NEGATIVE  NEGATIVE mg/dL Final  . Urobilinogen, UA 08/17/2014 0.2  0.0 - 1.0 mg/dL Final  . Nitrite 08/17/2014 NEGATIVE  NEGATIVE Final  . Leukocytes, UA 08/17/2014 SMALL* NEGATIVE Final  . MRSA, PCR 08/17/2014 NEGATIVE  NEGATIVE Final  . Staphylococcus aureus 08/17/2014 NEGATIVE  NEGATIVE Final   Comment:                                 The Xpert SA Assay (FDA                          approved for NASAL specimens                          in patients over 69 years of age),  is one component of                          a comprehensive surveillance                          program.  Test performance has                          been validated by [facility] for patients greater                          than or equal to 48 year old.                          It is not intended                          to diagnose infection nor to                          guide or monitor treatment.  . Squamous Epithelial / LPF 08/17/2014 MANY* RARE Final  . WBC, UA  08/17/2014 3-6  <3 WBC/hpf Final  . RBC / HPF 08/17/2014 0-2  <3 RBC/hpf Final  . Bacteria, UA 08/17/2014 FEW* RARE Final  . Crystals 08/17/2014 URIC ACID CRYSTALS* NEGATIVE Final  Admission on 07/12/2014, Discharged on 07/12/2014  Component Date Value Ref Range Status  . Hemoglobin 07/12/2014 14.4  12.0 - 15.0 g/dL Final     X-Rays:Dg Chest 2 View  08/17/2014   CLINICAL DATA:  Preoperative examination. Patient for hip replacement.  EXAM: CHEST  2 VIEW  COMPARISON:  Single view of the chest 08/13/2011.  FINDINGS: The lungs are clear. Heart size is mildly enlarged. No pneumothorax or pleural effusion. Scoliosis is noted.  IMPRESSION: No acute disease.  Mild cardiomegaly.   Electronically Signed   By: Inge Rise M.D.   On: 08/17/2014 10:55   Dg Femur Left  08/29/2014   CLINICAL DATA:  Bilateral femur pain  EXAM: LEFT FEMUR - 2 VIEW  COMPARISON:  08/25/2014  FINDINGS: There again noted changes consistent with the left femoral prosthesis as well as prior midshaft fracture with lateral fixation. A knee prosthesis is noted as well. No acute fracture is seen. No gross soft tissue abnormality is noted.  IMPRESSION: Stable appearance of the left femur when compared with the recent CT.   Electronically Signed   By: Inez Catalina M.D.   On: 08/29/2014 10:45   Dg Femur Right  08/29/2014   CLINICAL DATA:  Right leg pain  EXAM: RIGHT FEMUR - 2 VIEW  COMPARISON:  None.  FINDINGS: A right knee prosthesis is noted. The femur is otherwise intact. No fracture or dislocation is seen. No gross soft tissue abnormality is noted.  IMPRESSION: No acute abnormality noted.   Electronically Signed   By: Inez Catalina M.D.   On: 08/29/2014 10:46   Ct Hip Left W Contrast  08/25/2014   CLINICAL DATA:  One day postoperative. LEFT hip infection. Subsequent encounter. Previous fracture and subsequent infected arthroplasty. Incision and drainage of the LEFT hip. Exchange of  antibiotic spacer.  EXAM: CT OF THE LEFT HIP WITH  CONTRAST  TECHNIQUE: Multidetector CT imaging was performed following the standard protocol during bolus administration of intravenous contrast.  CONTRAST:  191m OMNIPAQUE IOHEXOL 300 MG/ML  SOLN  COMPARISON:  06/23/2012.  07/28/2011.  FINDINGS: Since the most recent CT, the arthroplasty components have been exchanged and there is an antibiotic impregnated acetabular shell in place. Nonunion of the posterior column fracture. Removal of the previously seen lag screws through the supra-acetabular LEFT ilium. With the revision of the LEFT femoral component, bone cement is now present in the proximal LEFT femur, with surrounding lucency which is probably related to prior infection. There is a plate with encircling cables around the proximal femoral shaft fracture.  Gas is present in the soft tissues of the proximal thigh, presumably associated with recent operation and plate and cable placement. There is gas and amorphous fluid in the inferior LEFT adductor compartment. In the postoperative setting, of the significance is unclear. This may represent postoperative hematoma which seems likely given the high attenuation. Gas in the adductor compartment soft tissues is low also probably postoperative. Abscess is in the differential considerations.  IMPRESSION: 1. Findings compatible with recent revision of complicated LEFT hip arthroplasty with antibiotic impregnated acetabular cup and femoral stem. 2. Proximal femoral encircling cables and plate across an oblique fracture of the femoral diaphysis. 3. High attenuation in the distal LEFT adductor compartment with locules of gas. After reviewing the operative notes, the differential considerations are hematoma with postoperative changes or infected hematoma. There is no drainable fluid collection identified. Given the recent operation and indeterminate appearance, this study will serve as a baseline.   Electronically Signed   By: GDereck LigasM.D.   On: 08/25/2014 09:52     Ir Fluoro Guide Cv Line Right  08/27/2014   INDICATION: Septic arthritis of left hip; central venous access is requested for antibiotic therapy.  EXAM: ULTRASOUND AND FLUOROSCOPIC GUIDED PICC LINE INSERTION  MEDICATIONS: None.  CONTRAST:  None  FLUOROSCOPY TIME:  36 seconds.  COMPLICATIONS: None immediate  TECHNIQUE: The procedure, risks, benefits, and alternatives were explained to the patient and informed written consent was obtained. A timeout was performed prior to the initiation of the procedure.  The right upper extremity was prepped with chlorhexidine in a sterile fashion, and a sterile drape was applied covering the operative field. Maximum barrier sterile technique with sterile gowns and gloves were used for the procedure. A timeout was performed prior to the initiation of the procedure. Local anesthesia was provided with 1% lidocaine.  Under direct ultrasound guidance, the right basilic vein was accessed with a micropuncture kit after the overlying soft tissues were anesthetized with 1% lidocaine. An ultrasound image was saved for documentation purposes. A guidewire was advanced to the level of the superior caval-atrial junction for measurement purposes and the PICC line was cut to length. A peel-away sheath was placed and a 49 cm, 5 [place] dual lumen was inserted to level of the superior caval-atrial junction. A post procedure spot fluoroscopic was obtained. The catheter easily aspirated and flushed and was sutured in place. A dressing was placed. The patient tolerated the procedure well without immediate post procedural complication.  FINDINGS: After catheter placement, the tip lies within the superior cavoatrial junction. The catheter aspirates and flushes normally and is ready for immediate use.  IMPRESSION: Successful ultrasound and fluoroscopic guided placement of a right basilic vein approach, 49 cm, 5 French, dual lumen PICC with tip  at the superior caval-atrial junction. The PICC line is  ready for immediate use.  Read by: Rowe Robert, PA-C   Electronically Signed   By: Maryclare Bean M.D.   On: 08/27/2014 11:59   Ir US Guide Vasc Access Right  08/27/2014   INDICATION: Septic arthritis of left hip; central venous access is requested for antibiotic therapy.  EXAM: ULTRASOUND AND FLUOROSCOPIC GUIDED PICC LINE INSERTION  MEDICATIONS: None.  CONTRAST:  None  FLUOROSCOPY TIME:  36 seconds.  COMPLICATIONS: None immediate  TECHNIQUE: The procedure, risks, benefits, and alternatives were explained to the patient and informed written consent was obtained. A timeout was performed prior to the initiation of the procedure.  The right upper extremity was prepped with chlorhexidine in a sterile fashion, and a sterile drape was applied covering the operative field. Maximum barrier sterile technique with sterile gowns and gloves were used for the procedure. A timeout was performed prior to the initiation of the procedure. Local anesthesia was provided with 1% lidocaine.  Under direct ultrasound guidance, the right basilic vein was accessed with a micropuncture kit after the overlying soft tissues were anesthetized with 1% lidocaine. An ultrasound image was saved for documentation purposes. A guidewire was advanced to the level of the superior caval-atrial junction for measurement purposes and the PICC line was cut to length. A peel-away sheath was placed and a 49 cm, 5 [place], dual lumen was inserted to level of the superior caval-atrial junction. A post procedure spot fluoroscopic was obtained. The catheter easily aspirated and flushed and was sutured in place. A dressing was placed. The patient tolerated the procedure well without immediate post procedural complication.  FINDINGS: After catheter placement, the tip lies within the superior cavoatrial junction. The catheter aspirates and flushes normally and is ready for immediate use.  IMPRESSION: Successful ultrasound and fluoroscopic guided placement of a right  basilic vein approach, 49 cm, 5 French, dual lumen PICC with tip at the superior caval-atrial junction. The PICC line is ready for immediate use.  Read by: Rowe Robert, PA-C   Electronically Signed   By: Maryclare Bean M.D.   On: 08/27/2014 11:59    EKG:No orders found for this or any previous visit.   Hospital Course: Patient was admitted to [facility] and taken to the OR and underwent the above state procedure without complications.  Patient tolerated the procedure well and was later transferred to the recovery room and then to the orthopaedic floor for postoperative care.  They were given PO and IV analgesics for pain control following their surgery.  They were given 24 hours of postoperative antibiotics of  Anti-infectives    Start     Dose/Rate Route Frequency Ordered Stop   08/29/14 0000  DAPTOmycin 650 mg in sodium chloride 0.9 % 100 mL     650 mg226 mL/hr over 30 Minutes Intravenous Every 24 hours 08/29/14 1321     08/29/14 0000  rifampin (RIFADIN) 300 MG capsule     300 mg Oral 2 times daily 08/29/14 1325     08/25/14 0200  vancomycin (VANCOCIN) IVPB 1000 mg/200 mL premix  Status:  Discontinued     1,000 mg200 mL/hr over 60 Minutes Intravenous Every 12 hours 08/24/14 1329 08/24/14 1821   08/24/14 2030  DAPTOmycin (CUBICIN) 650 mg in sodium chloride 0.9 % IVPB     650 mg226 mL/hr over 30 Minutes Intravenous Daily 08/24/14 1920     08/24/14 1400  vancomycin (VANCOCIN) 2,250 mg in sodium chloride  0.9 % 500 mL IVPB     2,250 mg250 mL/hr over 120 Minutes Intravenous  Once 08/24/14 1329 08/24/14 1700   08/24/14 0616  ceFAZolin (ANCEF) IVPB 2 g/50 mL premix     2 g100 mL/hr over 30 Minutes Intravenous On call to O.R. 08/24/14 0017 08/24/14 0740     and started on DVT prophylaxis in the form of Xarelto.   PT and OT were ordered for total hip protocol.  The patient was allowed to be only TDWB to the left leg with therapy. Discharge planning was consulted to help with postop disposition and  equipment needs. Infectious Disease consult was called to assist with treatment plan of the patient. Patient was seen by Dr. Tommy Medal. She was started on Daptomycin.   Patient had a rough night on the evening of surgery with moderate pain.  They started to get up OOB with therapy on day one and only took a coupel of steps.  Hemovac drain was pulled without difficulty.  The knee immobilizer was removed and discontinued.  Continued to work with therapy into day two and only walking a few feet in the room.  It was felt that she would need SNF for inpatient care.  Dressing was changed on day two and the incision was healing well.  By day three, PICC LINE was ordered for continued IV therapy.  Incision was healing well.  On POD 4, patient was seen in rounds and was ready to go to the SNF.  Receiving IV Daptomycin - plan for 6 weeks and then 6 months of oral antibiotics a per I.D. Team.  Rifampin has been recommended but is being held off until Xarelto is completed. Once she has completed the Xarelto, will add the Rifampin 300 mg BID.  The last dose of Xarelto will be Monday 09/17/2014.  Xarelto for a total off three weeks from day of surgery.  Diet: Regular diet Activity:  TDWB to the right leg No bending hip over 90 degrees- A "L" Angle Do not cross legs Do not let foot roll inward When turning these patients a pillow should be placed between the patient's legs to prevent crossing. Patients should have the affected knee fully extended when trying to sit or stand from all surfaces to prevent excessive hip flexion. When ambulating and turning toward the affected side the affected leg should have the toes turned out prior to moving the walker and the rest of patient's body as to prevent internal rotation/ turning in of the leg. Abduction pillows are the most effective way to prevent a patient from not crossing legs or turning toes in at rest. If an abduction pillow is not ordered placing a regular pillow length  wise between the patient's legs is also an effective reminder. It is imperative that these precautions be maintained so that the surgical hip does not dislocate. Follow-up:in 2 weeks with Dr. Wynelle Link Follow up with Dr. Linus Salmons or Tommy Medal in their clinic. Disposition - Skilled nursing facility - [facility] Discharged Condition: stable DVT Prophylaxis - Xarelto for a total off three weeks from day of surgery (last dose on Monday 09/17/2014). Once she has completed the Xarelto, will add the Rifampin 300 mg BID. Total of six weeks of Daptomycin and then six months of oral ABXs as per ID Team.  Weekly Labs - CK, CMP, and CBC and send results to: [facility], [place] [facility] [place] [email or web address]  Discharge Instructions  Call MD / Call 911    Complete by:  As directed   If you experience chest pain or shortness of breath, CALL 911 and be transported to the hospital emergency room.  If you develope a fever above 101 F, pus (white drainage) or increased drainage or redness at the wound, or calf pain, call your surgeon's office.     Change dressing    Complete by:  As directed   You may change your dressing dressing daily with sterile 4 x 4 inch gauze dressing and paper tape.  Do not submerge the incision under water.     Constipation Prevention    Complete by:  As directed   Drink plenty of fluids.  Prune juice may be helpful.  You may use a stool softener, such as Colace (over the counter) 100 mg twice a day.  Use MiraLax (over the counter) for constipation as needed.     Diet - low sodium heart healthy    Complete by:  As directed      Discharge instructions    Complete by:  As directed   Pick up stool softner and laxative for home. Do not submerge incision under water. May shower. Continue to use ice for pain and swelling from surgery. Hip precautions.  Total Hip Protocol.  Touch Down Weight Bearing Only to Left Leg  Take  Xarelto for two and a half more weeks, then discontinue Xarelto. Last Dose On Monday 09/17/2014. Once the patient has completed the Xarelto, they may resume the 81 mg Aspirin.  When discharged from the skilled rehab facility, please have the facility set up the patient's [place] prior to being released.  Also provide the patient with their medications at time of release from the facility to include their pain medication, the muscle relaxants, and their blood thinner medication.  If the patient is still at the rehab facility at time of follow up appointment, please also assist the patient in arranging follow up appointment in our office and any transportation needs.     Do not sit on low chairs, stoools or toilet seats, as it may be difficult to get up from low surfaces    Complete by:  As directed      Driving restrictions    Complete by:  As directed   No driving until released by the physician.     Follow the hip precautions as taught in Physical Therapy    Complete by:  As directed   Hip precautions.  Total Hip Protocol.     Increase activity slowly as tolerated    Complete by:  As directed      Lifting restrictions    Complete by:  As directed   No lifting until released by the physician.     Patient may shower    Complete by:  As directed   You may shower without a dressing once there is no drainage.  Do not wash over the wound.  If drainage remains, do not shower until drainage stops.     TED hose    Complete by:  As directed   Use stockings (TED hose) for 3 weeks on both leg(s).  You may remove them at night for sleeping.     Touch down weight bearing    Complete by:  As directed   Laterality:  left  Extremity:  Lower            Medication List    STOP  taking these medications        5-HTP Caps     aspirin EC 325 MG tablet     b complex vitamins tablet     CAL-MAG-ZINC PO     DHA OMEGA 3 PO     FAT BLOCKER PLUS Tabs     Fish Oil 1000 MG  Caps     GLUCOSAMINE CHONDR COMPLEX PO     meloxicam 7.5 MG tablet  Commonly known as:  MOBIC     OVER THE COUNTER MEDICATION     OVER THE COUNTER MEDICATION     oxyCODONE-acetaminophen 10-325 MG per tablet  Commonly known as:  PERCOCET     sulfamethoxazole-trimethoprim 800-160 MG per tablet  Commonly known as:  BACTRIM DS     WOMENS MULTIVITAMIN PLUS PO      TAKE these medications        acetaminophen 325 MG tablet  Commonly known as:  TYLENOL  Take 2 tablets (650 mg total) by mouth every 6 (six) hours as needed for mild pain (or Fever >/= 101).     bisacodyl 10 MG suppository  Commonly known as:  DULCOLAX  Place 1 suppository (10 mg total) rectally daily as needed for moderate constipation.     citalopram 40 MG tablet  Commonly known as:  CELEXA  Take 40 mg by mouth every morning.     clotrimazole-betamethasone cream  Commonly known as:  LOTRISONE  Apply 1 application topically 2 (two) times daily as needed (rash in skin folds).     DAPTOmycin 650 mg in sodium chloride 0.9 % 100 mL  Inject 650 mg into the vein daily. Daptomycin daily for a total of six weeks (38 more doses) via PICC Line.     docusate sodium 100 MG capsule  Commonly known as:  COLACE  Take 100-200 mg by mouth at bedtime as needed for mild constipation.     furosemide 40 MG tablet  Commonly known as:  LASIX  Take 40 mg by mouth 2 (two) times daily as needed (fluid retention).     gabapentin 400 MG capsule  Commonly known as:  NEURONTIN  Take 400 mg by mouth. ONE IN AM, ONE WITH LUNCH AND TWO AFTER DINNER     hydrOXYzine 25 MG tablet  Commonly known as:  ATARAX/VISTARIL  Take 25 mg by mouth every 6 (six) hours as needed for anxiety.     IRON PO  Take 1 tablet by mouth every morning.     methocarbamol 500 MG tablet  Commonly known as:  ROBAXIN  Take 1 tablet (500 mg total) by mouth every 6 (six) hours as needed for muscle spasms.     metoCLOPramide 5 MG tablet  Commonly known as:   REGLAN  Take 1-2 tablets (5-10 mg total) by mouth every 8 (eight) hours as needed for nausea (if ondansetron (ZOFRAN) ineffective.).     MOVANTIK PO  Take 25 mg by mouth at bedtime.     nicotine polacrilex 4 MG lozenge  Commonly known as:  COMMIT  Take 4 mg by mouth as needed for smoking cessation.     omeprazole 20 MG capsule  Commonly known as:  PRILOSEC  Take 20 mg by mouth every morning.     ondansetron 4 MG tablet  Commonly known as:  ZOFRAN  Take 1 tablet (4 mg total) by mouth every 6 (six) hours as needed for nausea.     oxybutynin 5 MG tablet  Commonly known as:  DITROPAN  Take 5 mg by mouth 3 (three) times daily as needed for bladder spasms.     Oxycodone HCl 10 MG Tabs  Take 1-2 tablets (10-20 mg total) by mouth every 3 (three) hours as needed for moderate pain, severe pain or breakthrough pain.     oxymorphone 20 MG 12 hr tablet  Commonly known as:  OPANA ER  Take 1 tablet (20 mg total) by mouth every 12 (twelve) hours.     polyethylene glycol packet  Commonly known as:  MIRALAX / GLYCOLAX  Take 17 g by mouth daily as needed for mild constipation.     rifampin 300 MG capsule  Commonly known as:  RIFADIN  Take 1 capsule (300 mg total) by mouth 2 (two) times daily. DO NOT START UNTIL Tuesday  09/18/2014 WHEN THE PATIENT COMES OFF THE XARELTO.     rivaroxaban 10 MG Tabs tablet  Commonly known as:  XARELTO  - Take 1 tablet (10 mg total) by mouth daily with breakfast. Take Xarelto for two and a half more weeks, then discontinue Xarelto.  - Once the patient has completed the Xarelto, they may resume the 325 mg Aspirin daily.  - LAST DOSE ON Monday 09/17/2014.     sodium chloride 0.65 % Soln nasal spray  Commonly known as:  OCEAN  Place 2 sprays into both nostrils daily as needed for congestion.     SUMAtriptan 100 MG tablet  Commonly known as:  IMITREX  Take 100 mg by mouth once. May repeat in 2 hours if headache persists or recurs. NO MORE THAN 2 IN A 24 HOUR  PERIOD     traZODone 150 MG tablet  Commonly known as:  DESYREL  Take 150-300 mg by mouth at bedtime.     triamcinolone cream 0.1 %  Commonly known as:  KENALOG  Apply 1 application topically 2 (two) times daily. CALF OF BOTH LEGS - BECAUSE OF SKIN IRRITATION THOUGHT TO BE DUE TO SWELLING OF LEGS           Follow-up Information    Follow up with Gearlean Alf, MD. Schedule an appointment as soon as possible for a visit on 09/11/2014.   Specialty:  Orthopedic Surgery   Why:  Call office at [phone] for appointment on Tuesday 09/11/2014.   Contact information:   [address] [place] [address] [phone]       Signed: Arlee Muslim, PA-C Orthopaedic Surgery 08/29/2014, 1:29 PM

## 2014-08-29 NOTE — Plan of Care (Signed)
Problem: Phase III Progression Outcomes Goal: Discharge plan remains appropriate-arrangements made Outcome: Completed/Met Date Met:  08/29/14

## 2014-08-29 NOTE — Progress Notes (Signed)
Subjective: 5 Days Post-Op Procedure(s) (LRB): INCISION AND DRAINAGE LEFT HIP, EXCHANGE ANTIBIOTIC SPACER, OPEN REDUCTION LEFT FEMUR FRACTURE  (Left) Patient reports pain as mild and moderate.   Patient seen in rounds with Dr. Lequita HaltAluisio.  Briefly discussed the surgical findings of the fluid collections. Patient is well, but has had some minor complaints of pain in the hip, requiring pain medications Patient is ready to go to the SNF. Receiving IV Daptomycin - plan for 6 weeks and then 6 months of oral antibiotics a per I.D. Team. Rifampin has been recommended but is being held off until Xarelto is completed. Once she has completed the Xarelto, will add the Rifampin 300 mg BID. Xarelto for a total off three weeks from day of surgery. Pain in the right thigh.  Will get xrays today of the right femur and also follow up film on the ORIF of the left leg.  Objective: Vital signs in last 24 hours: Temp:  [98.5 F (36.9 C)-99.6 F (37.6 C)] 98.5 F (36.9 C) (11/04 0544) Pulse Rate:  [82-93] 82 (11/04 0544) Resp:  [17-18] 18 (11/04 0544) BP: (103-136)/(43-60) 117/50 mmHg (11/04 0544) SpO2:  [93 %-98 %] 98 % (11/04 0544)  Intake/Output from previous day:  Intake/Output Summary (Last 24 hours) at 08/29/14 0732 Last data filed at 08/29/14 0600  Gross per 24 hour  Intake 1080.91 ml  Output    200 ml  Net 880.91 ml    Intake/Output this shift:    Labs:  Recent Labs  08/27/14 0517 08/28/14 0430 08/29/14 0615  HGB 7.8* 7.3* 9.2*    Recent Labs  08/28/14 0430 08/29/14 0615  WBC 2.7* 3.5*  RBC 2.32* 3.03*  HCT 21.1* 26.8*  PLT 79* 104*    Recent Labs  08/29/14 0615  NA 139  K 3.8  CL 101  CO2 27  BUN 10  CREATININE 0.60  GLUCOSE 100*  CALCIUM 8.8   No results for input(s): LABPT, INR in the last 72 hours.  EXAM: General - Patient is Alert, Appropriate and Oriented Extremity - Neurovascular intact Sensation intact distally Dorsiflexion/Plantar flexion  intact Incision - clean, dry, no drainage, staples intact Motor Function - intact, moving foot and toes well on exam.   Assessment/Plan: 5 Days Post-Op Procedure(s) (LRB): INCISION AND DRAINAGE LEFT HIP, EXCHANGE ANTIBIOTIC SPACER, OPEN REDUCTION LEFT FEMUR FRACTURE  (Left) Procedure(s) (LRB): INCISION AND DRAINAGE LEFT HIP, EXCHANGE ANTIBIOTIC SPACER, OPEN REDUCTION LEFT FEMUR FRACTURE  (Left) Past Medical History  Diagnosis Date  . Tenosynovitis of fingers     RIGHT RING AND SMALL-HAD SURGERY 07/12/14 [facility]  . Bronchitis     dx  07-04-2014 (approx) will finished antibiotic 07-14-2014  . History of concussion     x4  --  no residual  . Migraines   . Depression   . 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 GERD (gastroesophageal reflux disease)   . H/O hiatal hernia   . Chronic constipation   . Chronic narcotic use   . Nocturia   . SUI (stress urinary incontinence, female)   . OA (osteoarthritis)     hips, knees, hips  . Infection of left prosthetic hip joint     left total arthroplasty done 2012/  jan 2015 removal of prosthesis  . Full dentures   . History of cellulitis     lower extremities  .  Thinning of skin     easily tears  . Venous stasis dermatitis     BILATERAL LEGS  . Productive cough     RESOLVED - QUIT SMOKING  . Memory problem   . OSA on CPAP     study x3   last one few yrs ago  cpap recommended -- DOES NOT USE OR HAVE CPAP   Principal Problem:   Septic arthritis of hip  Estimated body mass index is 34.29 kg/(m^2) as calculated from the following:   Height as of this encounter: 5\' 10"  (1.778 m).   Weight as of this encounter: 108.41 kg (239 lb). Continue ABX therapy due to Post-op infection Diet - Regular diet Follow up - in 2 weeks with Dr, Lequita HaltAluisio Activity - TDWB to the right leg Disposition - Skilled nursing facility Condition Upon Discharge -  Stable D/C Meds - See DC Summary DVT Prophylaxis - Xarelto for a total off three weeks from day of srugery. Rifampin has been recommended but is being held off until Xarelto is completed. Once she has completed the Xarelto, will add the Rifampin 300 mg BID. Total of six weeks of Daptomycin and then six months of oral ABXs.  [name] Peacerew Mycah Formica, PA-C Orthopaedic Surgery 08/29/2014, 7:32 AM

## 2014-08-29 NOTE — Progress Notes (Signed)
[name] for Infectious Disease  Date of Admission:  08/24/2014  Late entry  Antibiotics: daptomycin  Subjective: Some pain in hip  Objective: Temp:  [98.5 F (36.9 C)-99.6 F (37.6 C)] 98.5 F (36.9 C) (11/04 0544) Pulse Rate:  [82-93] 82 (11/04 0544) Resp:  [17-18] 18 (11/04 0544) BP: (103-136)/(43-60) 117/50 mmHg (11/04 0544) SpO2:  [93 %-98 %] 98 % (11/04 0544)  General: awake, alert, nad Skin: no rashes Lungs: CTA Cor: RRR Abdomen: soft, nt   Lab Results Lab Results  Component Value Date   WBC 3.5* 08/29/2014   HGB 9.2* 08/29/2014   HCT 26.8* 08/29/2014   MCV 88.4 08/29/2014   PLT 104* 08/29/2014    Lab Results  Component Value Date   CREATININE 0.60 08/29/2014   BUN 10 08/29/2014   NA 139 08/29/2014   K 3.8 08/29/2014   CL 101 08/29/2014   CO2 27 08/29/2014    Lab Results  Component Value Date   ALT 16 08/17/2014   AST 28 08/17/2014   ALKPHOS 106 08/17/2014   BILITOT 0.4 08/17/2014      Microbiology: Recent Results (from the past 240 hour(s))  Body fluid culture     Status: None   Collection Time: 08/24/14  8:09 AM  Result Value Ref Range Status   Specimen Description FLUID LEFT HIP  Final   Special Requests NONE  Final   Gram Stain   Final    MODERATE WBC PRESENT,BOTH PMN AND MONONUCLEAR NO ORGANISMS SEEN Gram Stain Report Called to,Read Back By and Verified With: Gram Stain Report Called to,Read Back By and Verified With: P.COTTA AT 0855 ON 08/24/14 BY SHUEA Performed by [facility] Performed at [facility]   Final    NO GROWTH 3 DAYS Performed at [facility]    Report Status 08/27/2014 FINAL  Final  Anaerobic culture     Status: None (Preliminary result)   Collection Time: 08/24/14  8:09 AM  Result Value Ref Range Status   Specimen Description FLUID LEFT HIP  Final   Special Requests NONE  Final   Gram Stain   Final    MODERATE WBC PRESENT,BOTH PMN AND MONONUCLEAR NO ORGANISMS  SEEN Gram Stain Report Called to,Read Back By and Verified With: Gram Stain Report Called to,Read Back By and Verified With: P.COTTA AT 0855 ON 08/24/14 BY SHUEA Performed by [facility] Performed at [facility]   Culture   Final    NO ANAEROBES ISOLATED; CULTURE IN PROGRESS FOR 5 DAYS Performed at [facility]   Report Status PENDING  Incomplete  Gram stain     Status: None   Collection Time: 08/24/14  8:09 AM  Result Value Ref Range Status   Specimen Description FLUID LEFT HIP  Final   Special Requests NONE  Final   Gram Stain   Final    MODERATE WBC PRESENT,BOTH PMN AND MONONUCLEAR NO ORGANISMS SEEN Gram Stain Report Called to,Read Back By and Verified With: Barbie Banner RN AT [phone] ON 10.30.15 BY SHUEA   Report Status 08/24/2014 FINAL  Final    Studies/Results: Ir Fluoro Guide Cv Line Right  08/27/2014   INDICATION: Septic arthritis of left hip; central venous access is requested for antibiotic therapy.  EXAM: ULTRASOUND AND FLUOROSCOPIC GUIDED PICC LINE INSERTION  MEDICATIONS: None.  CONTRAST:  None  FLUOROSCOPY TIME:  36 seconds.  COMPLICATIONS: None immediate  TECHNIQUE: The procedure, risks, benefits, and alternatives  were explained to the patient and informed written consent was obtained. A timeout was performed prior to the initiation of the procedure.  The right upper extremity was prepped with chlorhexidine in a sterile fashion, and a sterile drape was applied covering the operative field. Maximum barrier sterile technique with sterile gowns and gloves were used for the procedure. A timeout was performed prior to the initiation of the procedure. Local anesthesia was provided with 1% lidocaine.  Under direct ultrasound guidance, the right basilic vein was accessed with a micropuncture kit after the overlying soft tissues were anesthetized with 1% lidocaine. An ultrasound image was saved for documentation purposes. A guidewire was advanced to the level of the  superior caval-atrial junction for measurement purposes and the PICC line was cut to length. A peel-away sheath was placed and a 49 cm, 5 [place], dual lumen was inserted to level of the superior caval-atrial junction. A post procedure spot fluoroscopic was obtained. The catheter easily aspirated and flushed and was sutured in place. A dressing was placed. The patient tolerated the procedure well without immediate post procedural complication.  FINDINGS: After catheter placement, the tip lies within the superior cavoatrial junction. The catheter aspirates and flushes normally and is ready for immediate use.  IMPRESSION: Successful ultrasound and fluoroscopic guided placement of a right basilic vein approach, 49 cm, 5 French, dual lumen PICC with tip at the superior caval-atrial junction. The PICC line is ready for immediate use.  Read by: Rowe Robert, PA-C   Electronically Signed   By: Maryclare Bean M.D.   On: 08/27/2014 11:59   Ir US Guide Vasc Access Right  08/27/2014   INDICATION: Septic arthritis of left hip; central venous access is requested for antibiotic therapy.  EXAM: ULTRASOUND AND FLUOROSCOPIC GUIDED PICC LINE INSERTION  MEDICATIONS: None.  CONTRAST:  None  FLUOROSCOPY TIME:  36 seconds.  COMPLICATIONS: None immediate  TECHNIQUE: The procedure, risks, benefits, and alternatives were explained to the patient and informed written consent was obtained. A timeout was performed prior to the initiation of the procedure.  The right upper extremity was prepped with chlorhexidine in a sterile fashion, and a sterile drape was applied covering the operative field. Maximum barrier sterile technique with sterile gowns and gloves were used for the procedure. A timeout was performed prior to the initiation of the procedure. Local anesthesia was provided with 1% lidocaine.  Under direct ultrasound guidance, the right basilic vein was accessed with a micropuncture kit after the overlying soft tissues were anesthetized  with 1% lidocaine. An ultrasound image was saved for documentation purposes. A guidewire was advanced to the level of the superior caval-atrial junction for measurement purposes and the PICC line was cut to length. A peel-away sheath was placed and a 49 cm, 5 [place], dual lumen was inserted to level of the superior caval-atrial junction. A post procedure spot fluoroscopic was obtained. The catheter easily aspirated and flushed and was sutured in place. A dressing was placed. The patient tolerated the procedure well without immediate post procedural complication.  FINDINGS: After catheter placement, the tip lies within the superior cavoatrial junction. The catheter aspirates and flushes normally and is ready for immediate use.  IMPRESSION: Successful ultrasound and fluoroscopic guided placement of a right basilic vein approach, 49 cm, 5 French, dual lumen PICC with tip at the superior caval-atrial junction. The PICC line is ready for immediate use.  Read by: Rowe Robert, PA-C   Electronically Signed   By: Duffy Rhody.D.  On: 08/27/2014 11:59    Assessment/Plan: 1) hip joint infection with remote removal of prosthesis.  Noted inflammed tissue.  On daptomycin.  CK is now WNL.     Can add rifampin after she finishes Xarelto after 3 weeks at 300 mg bid.   She will need at least 6 weeks of daptomycin and continuation for about 6 months with oral Bactrim or doxycycline Will need weekly CK, cmp and cbc at SNF To SNF soon.  We will follow up with her in our clinic   COMER, Herbie Baltimore, [place] for Infectious Disease [place] [email or web address] [phone] pager   [phone] cell 08/29/2014, 7:57 AM

## 2014-09-03 NOTE — Progress Notes (Signed)
Patient ID: [name], female   DOB: 1955-11-16, 58 y.o.   MRN: [id number]               HISTORY & PHYSICAL  DATE:  08/29/2014    FACILITY: [facility]        LEVEL OF CARE:   SNF   HISTORY OF PRESENT ILLNESS:  This is a 58 year-old woman who came to [place] after a stay at [facility] from 08/24/2014 through 08/29/2014.  She was originally admitted electively for a revision and replacement of a left total hip arthroplasty by Dr. Dr. Lequita Halt.  Apparently in surgery, she was found to have what was felt to be semi-purulent material as well as an unexpected spiral left femur fracture.  The fracture was repaired.  She has an antibiotic spacer in place and, because she has had a previous reaction to vancomycin, she is now on six weeks' worth of daptomycin (Cubicin).    The history that the patient gives is really painful to listen to.  Her apparent original injury was in 2012.  She fell on her knee and suffered an acetabular fracture.  This was repaired at [facility] with screws.  Postoperatively, she really did not do all that well with increasing pain in the left hip.  Probably 5-6 months later at [facility], she was taken back to the OR as apparently the screws had migrated.  She had a left total hip replacement at that time.  Again, postoperatively, she did not do particularly well.  She was in rehabilitation, but never really mobilized all that well.  She spent most of her time doing short transfers from bed to wheelchair, etc.  She was taken back to the OR in January 2015, this time at [facility].  At that point, I think she had a replacement of her left total hip arthroplasty.  She did not do well and developed a significant infection, and in February had to be admitted urgently to the hospital.  She underwent aspiration of the joint.  No fluid was obtained.  She went back to the OR on 12/06/2013 for a formal I&D.  Purulent debris was obtained.  Fluid cultures were sent and grew  MRSA.  She was subsequently back in the OR a week later for joint exploration.  Purulence and debris were noted.  The femoral and acetabular components of the prosthesis were removed and an antibiotic spacer was placed.    She completed a course of antibiotics as an outpatient.  Ultimately, she apparently was cleared to have the hip replaced and went to see Dr. Dr. Lequita Halt for a second opinion.  He took her to the OR on this admission and found purulent-looking fluid as well as an unexpected left femur fracture, apparently.  The patient had an antibiotic spacer placed with a stem in the femur.     Cultures were apparently taken from the OR, but for some reason I cannot find these.  She has, however, been recommended for six weeks' worth of daptomycin.    PAST MEDICAL HISTORY/PROBLEM LIST:   Includes:    Tenosynovitis of the fingers.  Had surgery on her hand on the right on 07/12/2014.    Bronchitis.    History of concussion x4.    Migraines.    Depression with anxiety.    Has never been really able to ambulate on the hip.    Gastroesophageal reflux disease.     Hiatal hernia.    Chronic constipation.  Chronic narcotic use.    Stress urinary incontinence.    Osteoarthritis.    Infection of the left prosthetic hip joint.    History of cellulitis.    Stasis dermatitis.    Obstructive sleep apnea, on CPAP.   She does not have CPAP.    PAST SURGICAL HISTORY:  Includes:    Appendectomy.    Laparoscopy with lysis of abdominal adhesions.    ORIF, left foot fracture.    ORIF, right tibial fracture.    Open repair and fixation of right tibial and knee.    Total knee arthroplasty on the right.    Total knee arthroplasty on the left in 2009.    ORIF of pelvic fractures in 2012.    Revision of total hip arthroplasty in January 2015.    Removal of the left hip arthroplasty in February 2015 secondary to MRSA infection.    Laparoscopic cholecystectomy.    CURRENT  MEDICATIONS:  Medication list is reviewed.      Tylenol 2 tablets q.6 hours p.r.n.    Dulcolax 1 suppository daily p.r.n.     Celexa 40 mg q.d.    Lotrisone 1 application b.i.d.    Daptomycin 650 into the vein daily for a total of six weeks.  She apparently has 38 more doses.    Lasix 40 b.i.d. p.r.n.    Neurontin 400 by mouth, 1 in the morning, 1 with lunch, and 2 after dinner.    Atarax 25 q.6 hours p.r.n. anxiety.     Iron tablet, no dose listed, 1 tablet daily.    Robaxin 500 mg q.6 hours p.r.n.     Reglan 1-2, 5-10 mg by mouth every 8 hours as needed for nausea.    Movantik 25 mg at bedtime.    Nicotine lozenges 4 mg as needed for nicotine withdrawal.    Prilosec 20 q.d.    Zofran 4 mg q.6 hours p.r.n.    Ditropan 5 mg three times daily.    Oxycodone 1-2 tablets, 10-20 mg as needed for pain.    Opana ER 20 mg q.12.    MiraLAX 17 g daily.    Rifampin, not to start until 09/14/2014 when patient comes off Xarelto.    Xarelto 10 mg daily.    Imitrex 100 mg once, repeated in two hours, for migraines.    Desyrel 150-300 mg at bedtime.    SOCIAL HISTORY:   HOUSING:  The patient lives at home with her husband.   FUNCTIONAL STATUS:  I am not completely certain of her functional status, although it does not really sound that she mobilizes at all.  She is able to stand and pivot transfers, as far as I am understanding. She basically has a wheelchair existence, although she is active and was [facility] until the time she was admitted.    REVIEW OF SYSTEMS:   CHEST/RESPIRATORY:  No cough.  No sputum.      CARDIAC:   No chest pain.   GI:  Chronic constipation.   GU:  No difficulties.   MUSCULOSKELETAL:  She states she cannot flex her right hip.  States this has been going on for months.   NEUROLOGICAL:   States that she has rhythmic pronation and supination of her wrists in the morning, often while she is eating breakfast.    PHYSICAL EXAMINATION:     GENERAL APPEARANCE:   The patient is not in any distress.  Talkative woman.  She is  able to give her own history.   CHEST/RESPIRATORY:  Clear air entry bilaterally.   CARDIOVASCULAR:  CARDIAC:   Heart sounds are normal.   GASTROINTESTINAL:  LIVER/SPLEEN/KIDNEYS:  No liver, no spleen.  No tenderness.   ABDOMEN:  No masses.   GENITOURINARY:  BLADDER:   Not distended.   MUSCULOSKELETAL:   EXTREMITIES:   BILATERAL LOWER EXTREMITIES:   Her left hip incision looks stable.  Sutures are in.  Looks clean.  No drainage noted.  Previous bilateral total knee replacements.   SKIN:  INSPECTION:  She has bilateral stasis dermatitis.   NEUROLOGICAL:     SENSATION/STRENGTH:  In the upper extremities, I see nothing abnormal here.  In the lower extremities, she cannot flex her hip on the right.  She seems to have normal abduction, extension, adduction, but I cannot get her to lift the right leg off the bed.  Her knee extension is 4/5.   DEEP TENDON REFLEXES:  She is diffusely hyperreflexic.  Her toes are downgoing.     ASSESSMENT/PLAN:  Now receiving six weeks of daptomycin with an antibiotic spacer in her left hip. Strangely enough, I cannot see the final culture results here although cultures were quoted by Infectious Disease.  She did have clearly an extensive problematic set of [facility] two months in the early part of this year, at which time she cultured MRSA.  She completed a difficult course of antibiotics.  I am not really certain whether she was on oral antibiotics or not.    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  She is not a diabetic.    I think incidentally discovered left femur fracture which was repaired during this hospitalization.    History of sleep apnea, but not using CPAP.      Apparent chronic constipation, although she thinks this is resolving.  She takes Movantik, which she says helps.    Venous  stasis dermatitis, but no open areas on her legs.    Right hip weakness only in flexion.  I wonder if this is some form of mononeuritis.  She states this has been going on for several months.  Ultimately, she might need EMGs and nerve conduction studies.    This patient is going to have a difficult rehabilitation course.  It does not sound like she was that functional in terms of mobility even before this recent finding.  She is touch-toe weightbearing on the left.  I suspect if she is able to do a pivot transfer with a walker on the right leg, that would be a good outcome.    Finally, I was able to see the operative cultures which were negative.      Lab work will be ordered and forwarded to Dr. Daiva EvesVan Dam at his request.    CPT CODE: [id number]

## 2014-09-05 ENCOUNTER — Non-Acute Institutional Stay (SKILLED_NURSING_FACILITY): Payer: Medicare Other | Admitting: Internal Medicine

## 2014-09-05 DIAGNOSIS — S7292XP Unspecified fracture of left femur, subsequent encounter for closed fracture with malunion: Secondary | ICD-10-CM

## 2014-09-05 DIAGNOSIS — M009 Pyogenic arthritis, unspecified: Secondary | ICD-10-CM

## 2014-09-07 NOTE — Progress Notes (Addendum)
Patient ID: [name], female   DOB: 03/30/1956, 58 y.o.   MRN: [id number]              PROGRESS NOTE  DATE:  09/05/2014    [facility]    LEVEL OF CARE:   SNF   Acute Visit   CHIEF COMPLAINT:  Review of general medical concerns.    HISTORY OF PRESENT ILLNESS:  This is a 58 year-old woman whom I admitted to the facility last week.  She was admitted to hospital for elective revision and replacement of a left total hip arthroplasty by Dr. Dr. Lequita HaltAluisio.  In surgery, she was found to have what was felt to be infection and also a spiral fracture of the left femur.  As she has a history of MRSA (please see my note of 08/29/2014), she has an antibiotic spacer in place and is now on six weeks' worth of daptomycin.    The patient states her pain is marginally controlled.  It is worth noting that she has been on Opana for several years for chronic pain.  She complains of pain, warmth, and drainage coming out of the left hip.  She also is concerned about swelling in her left leg, in general.    Finally, she wants the Robaxin stopped because the staff give her this first for pain rather than her p.r.n. narcotics that are already ordered.    LABORATORY DATA:  Lab work from last week on 08/31/2014 showed a white count of 3.7.  Differential count is normal.  Hemoglobin is 8.6.  Platelet count is slightly reduced at 146.  This compares with 3.5, 9.2, and 104 when she was in the hospital.  In fact, on 08/26/2014, her white count was 3, hemoglobin 7.7, platelet count at 67,000.  It would appear that her pre-surgical values were 6.9, 11.6, and 100,000 respectively.    PHYSICAL EXAMINATION:   GENERAL APPEARANCE:  The patient is not in any distress.   SKIN:  INSPECTION:  Left leg incision is extensive.  There is a small draining area towards the superior aspect of the incision line.  A fair amount of drainage is seen.  However, this is not purulent-looking.  I would wonder  about some form of seroma here.  She is tender and warm, although I am not convinced this represents an infection.   CIRCULATION:  EDEMA/VARICOSITIES:  More distally in the left leg, there is intense swelling of the left calf with warmth.  She does have severe venous stasis bilaterally.  However, the left is much more swollen than the right.  The left thigh is normal.    ASSESSMENT/PLAN:  Presumably MRSA left hip infection.  She continues on daptomycin.  I will make sure that they are doing screening lab work.    Swelling and warmth of the left leg below the knee.  I think it is safe to do a duplex ultrasound on this woman's leg, just to be certain.  She is on Xarelto 10 for DVT prophylaxis, although she would certainly be at risk.  She has severe bilateral venous stasis, left greater than right.    Drainage from the left hip.  At this point, I think this is likely a draining seroma/hematoma.  I do not think there are any additional infection issues here.    Postoperative pain in a patient on longstanding high-dose narcotics.  She does have oxycodone 10-20 mg p.r.n. for pain on top of the Opana 20 mg q.12.

## 2014-09-11 ENCOUNTER — Encounter: Payer: Self-pay | Admitting: Internal Medicine

## 2014-09-11 ENCOUNTER — Non-Acute Institutional Stay (SKILLED_NURSING_FACILITY): Payer: Medicare Other | Admitting: Internal Medicine

## 2014-09-11 DIAGNOSIS — M009 Pyogenic arthritis, unspecified: Secondary | ICD-10-CM

## 2014-09-11 DIAGNOSIS — R29898 Other symptoms and signs involving the musculoskeletal system: Secondary | ICD-10-CM

## 2014-09-11 DIAGNOSIS — S7292XP Unspecified fracture of left femur, subsequent encounter for closed fracture with malunion: Secondary | ICD-10-CM

## 2014-09-11 NOTE — Progress Notes (Signed)
Patient ID: [name], female   DOB: [birth year], 58 y.o.   MRN: [id number]  [facility] [employer] [facility] Renato Gails, D.O., C.M.D.  PCP: Carolynn Serve, MD  Code Status: full code  Allergies  Allergen Reactions  . Vancomycin Other (See Comments)    LEUKOPENIA    Chief Complaint  Patient presents with  . Discharge Note    discharge home with home health [facility]) and DME    HPI:  58 yo female here for short term rehab s/p hospitalization for left total hip reimplantation and stay at [facility] from 10/30-11/4/15.  In surgery she was found to have semi purulent material and an unexpected spiral left femur fracture which was repaired.  She has an antibiotic spacer in place and is completing a 6 wk course of cubicin.  She was seen last week by Dr. Leanord Hawking due to increased pain and swelling of the left leg.  She is on opana, and she does not want to get robaxin first before her narcotics for pain.  He ordered a venous duplex to r/o DVT (is on xarelto).  He suspected draining seroma/hematoma causing her pain and swelling.  Narcotics were continued.  She has received PT, OT.  She is toe touch weightbearing.  She requires continued home health pt, ot, rn.  She has not yet completed the course of daptomycin.  She is to have weekly labs faxed to ID.  She has a power wheelchair, walker and accessible bathroom.  She is to f/u with ortho, ID, and her PCP, Dr. Steele Berg.    Review of Systems:  Review of Systems  Constitutional: Negative for fever and chills.  Respiratory: Negative for shortness of breath.   Cardiovascular: Negative for chest pain.  Gastrointestinal: Negative for abdominal pain and constipation.  Genitourinary: Negative for dysuria.  Musculoskeletal: Positive for myalgias and joint pain. Negative for falls.       Left leg pain and swelling  Neurological: Positive for sensory change.     Past Medical History  Diagnosis Date  . Tenosynovitis of fingers     RIGHT RING AND  SMALL-HAD SURGERY 07/12/14 [facility]  . Bronchitis     dx  07-04-2014 (approx) will finished antibiotic 07-14-2014  . History of concussion     x4  --  no residual  . Migraines   . Depression   . 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 GERD (gastroesophageal reflux disease)   . H/O hiatal hernia   . Chronic constipation   . Chronic narcotic use   . Nocturia   . SUI (stress urinary incontinence, female)   . OA (osteoarthritis)     hips, knees, hips  . Infection of left prosthetic hip joint     left total arthroplasty done 2012/  jan 2015 removal of prosthesis  . Full dentures   . History of cellulitis     lower extremities  . Thinning of skin     easily tears  . Venous stasis dermatitis     BILATERAL LEGS  . Productive cough     RESOLVED - QUIT SMOKING  . Memory problem   . OSA on CPAP     study x3   last one few yrs ago  cpap recommended -- DOES NOT USE OR HAVE CPAP    Past Surgical History  Procedure Laterality Date  . Appendectomy  [id number] (age 58)  . 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 Orif left foot fx  1990's  . Orif right tibial fx  2008    hardware removed same year  . Open repair and fixation right tibia and knee  X2  2009  . Total knee arthroplasty Right X2  2009  &  2010  . Total knee arthroplasty Left 2009  . Knee arthroscopy Left 2009  . Orif pelvic fx's  11 /2011  . Total hip arthroplasty Left 2012  . Revision total hip arthroplasty Left 01/ 2015  . Removal prothesis  left hip arthroplasty  02/ 2015    INFECTION  . Laparoscopic cholecystectomy  08-12-2003  . Tenosynovectomy Right 07/12/2014    Procedure: RIGHT RING/SMALL FINGER TENOSYNOVECTOMY;  Surgeon: Sharma CovertFred W Ortmann, MD;  [facility] [place];  Service: Orthopedics;  Laterality: Right;  . Total hip  revision Left 08/24/2014    Procedure: INCISION AND DRAINAGE LEFT HIP, EXCHANGE ANTIBIOTIC SPACER, OPEN REDUCTION LEFT FEMUR FRACTURE ;  Surgeon: Loanne DrillingFrank Aluisio V, MD;  Location: [facility];  Service: Orthopedics;  Laterality: Left;    Social History:   reports that she quit smoking about 2 months ago. Her smoking use included Cigarettes. She has a 30 pack-year smoking history. She has never used smokeless tobacco. She reports that she does not drink alcohol or use illicit drugs.  No family history on file.  Medications: Patient's Medications  New Prescriptions   No medications on file  Previous Medications   ACETAMINOPHEN (TYLENOL) 325 MG TABLET    Take 2 tablets (650 mg total) by mouth every 6 (six) hours as needed for mild pain (or Fever >/= 101).   BISACODYL (DULCOLAX) 10 MG SUPPOSITORY    Place 1 suppository (10 mg total) rectally daily as needed for moderate constipation.   CITALOPRAM (CELEXA) 40 MG TABLET    Take 40 mg by mouth every morning.   CLOTRIMAZOLE-BETAMETHASONE (LOTRISONE) CREAM    Apply 1 application topically 2 (two) times daily as needed (rash in skin folds).   DAPTOMYCIN 650 MG IN SODIUM CHLORIDE 0.9 % 100 ML    Inject 650 mg into the vein daily. Daptomycin daily for a total of six weeks (38 more doses) via PICC Line.   DOCUSATE SODIUM (COLACE) 100 MG CAPSULE    Take 100-200 mg by mouth at bedtime as needed for mild constipation.   FUROSEMIDE (LASIX) 40 MG TABLET    Take 40 mg by mouth 2 (two) times daily as needed (fluid retention).   GABAPENTIN (NEURONTIN) 400 MG CAPSULE    Take 400 mg by mouth. ONE IN AM, ONE WITH LUNCH AND TWO AFTER DINNER   HYDROXYZINE (ATARAX/VISTARIL) 25 MG TABLET    Take 25 mg by mouth every 6 (six) hours as needed for anxiety.   IRON PO    Take 1 tablet by mouth every morning.   METHOCARBAMOL (ROBAXIN) 500 MG TABLET    Take 1 tablet (500 mg total) by mouth every 6 (six) hours as needed for muscle spasms.   METOCLOPRAMIDE (REGLAN) 5 MG TABLET    Take  1-2 tablets (5-10 mg total) by mouth every 8 (eight) hours as needed for nausea (if ondansetron (ZOFRAN) ineffective.).   NALOXEGOL OXALATE (MOVANTIK PO)    Take 25 mg by mouth at bedtime.    NICOTINE POLACRILEX (COMMIT) 4 MG LOZENGE  Take 4 mg by mouth as needed for smoking cessation.   OMEPRAZOLE (PRILOSEC) 20 MG CAPSULE    Take 20 mg by mouth every morning.   ONDANSETRON (ZOFRAN) 4 MG TABLET    Take 1 tablet (4 mg total) by mouth every 6 (six) hours as needed for nausea.   OXYBUTYNIN (DITROPAN) 5 MG TABLET    Take 5 mg by mouth 3 (three) times daily as needed for bladder spasms.    OXYCODONE 10 MG TABS    Take 1-2 tablets (10-20 mg total) by mouth every 3 (three) hours as needed for moderate pain, severe pain or breakthrough pain.   OXYMORPHONE (OPANA ER) 20 MG 12 HR TABLET    Take 1 tablet (20 mg total) by mouth every 12 (twelve) hours.   POLYETHYLENE GLYCOL (MIRALAX / GLYCOLAX) PACKET    Take 17 g by mouth daily as needed for mild constipation.   RIFAMPIN (RIFADIN) 300 MG CAPSULE    Take 1 capsule (300 mg total) by mouth 2 (two) times daily. DO NOT START UNTIL Tuesday  09/18/2014 WHEN THE PATIENT COMES OFF THE XARELTO.   RIVAROXABAN (XARELTO) 10 MG TABS TABLET    Take 1 tablet (10 mg total) by mouth daily with breakfast. Take Xarelto for two and a half more weeks, then discontinue Xarelto. Once the patient has completed the Xarelto, they may resume the 325 mg Aspirin daily. LAST DOSE ON Monday 09/17/2014.   SODIUM CHLORIDE (OCEAN) 0.65 % SOLN NASAL SPRAY    Place 2 sprays into both nostrils daily as needed for congestion.   SUMATRIPTAN (IMITREX) 100 MG TABLET    Take 100 mg by mouth once. May repeat in 2 hours if headache persists or recurs. NO MORE THAN 2 IN A 24 HOUR PERIOD   TRAZODONE (DESYREL) 150 MG TABLET    Take 150-300 mg by mouth at bedtime.    TRIAMCINOLONE CREAM (KENALOG) 0.1 %    Apply 1 application topically 2 (two) times daily. CALF OF BOTH LEGS - BECAUSE OF SKIN IRRITATION  THOUGHT TO BE DUE TO SWELLING OF LEGS  Modified Medications   No medications on file  Discontinued Medications   No medications on file    Physical Exam: Filed Vitals:   09/11/14 1208  BP: 100/52  Pulse: 71  Temp: 97.7 F (36.5 C)  Resp: 18  Height: 5\' 10"  (1.778 m)  Weight: 260 lb (117.935 kg)  Physical Exam  Constitutional: She is oriented to person, place, and time. She appears well-developed and well-nourished. No distress.  Cardiovascular: Normal rate, regular rhythm, normal heart sounds and intact distal pulses.   Pulmonary/Chest: Effort normal and breath sounds normal. No respiratory distress.  Abdominal: Soft. Bowel sounds are normal. She exhibits no distension and no mass. There is no tenderness.  Musculoskeletal:  Toe touch weightbearing on left; left hip with minimal drainage  Neurological: She is alert and oriented to person, place, and time.  Skin: Skin is warm and dry.   Labs reviewed: Basic Metabolic Panel:  Recent Labs  16/07/9609/31/15 0445 08/26/14 0523 08/29/14 0615  NA 141 145 139  K 3.8 3.9 3.8  CL 106 110 101  CO2 26 26 27   GLUCOSE 109* 112* 100*  BUN 10 11 10   CREATININE 0.63 0.57 0.60  CALCIUM 7.7* 8.1* 8.8   Liver Function Tests:  Recent Labs  08/17/14 0951  AST 28  ALT 16  ALKPHOS 106  BILITOT 0.4  PROT 7.5  ALBUMIN 3.7   No results  for input(s): LIPASE, AMYLASE in the last 8760 hours. No results for input(s): AMMONIA in the last 8760 hours. CBC:  Recent Labs  08/27/14 0517 08/28/14 0430 08/29/14 0615  WBC 2.9* 2.7* 3.5*  HGB 7.8* 7.3* 9.2*  HCT 22.6* 21.1* 26.8*  MCV 90.8 90.9 88.4  PLT 72* 79* 104*   Cardiac Enzymes:  Recent Labs  08/25/14 0445 08/28/14 0430  CKTOTAL 358* 145    Assessment/Plan:   1. Septic arthritis of pelvic region or thigh, left 2. Femur fracture, left, closed, with malunion, subsequent encounter 3. Weakness of right hip  Patient is being discharged with home health services:  PT for gait  training, OT for adl training, RN for IV abx through 10/07/14 and wound care:  Clean left hip surgical wound with wound cleanser and apply dry dressing daily, may change if excessive drainage, IV abx to be administered daily; weekly CK, cbc, cmp while on IV and fax to Dr. Luciana Axe at ID; change PICC line cap and dressing weekly.  F/u with orthopedics and infectious diseases as well as PCP Dr[facility] Felix Pac[facility] at [facility].    Patient is being discharged with the following durable medical equipment:  Hospital bed and mattress due to chronic pain to hip caused by infection, hip removal and femoral fx.  This will alleviate pain by allowing left lower extremity to be positioned in ways not feasible with a normal bed.  Pian episodes require frequent change in body position which cannot be achieved with a normal bed.  Patient has been advised to f/u with their PCP in 1-2 weeks to bring them up to date on their rehab stay.  They were provided with a 30 day supply of scripts for prescription medications and refills must be obtained from their PCP.    Labs/tests ordered:  Cbc, CK, cmp weekly while on abx faxed to Dr. Luciana Axe

## 2014-09-18 ENCOUNTER — Ambulatory Visit (INDEPENDENT_AMBULATORY_CARE_PROVIDER_SITE_OTHER): Payer: Medicare Other | Admitting: Internal Medicine

## 2014-09-18 ENCOUNTER — Telehealth: Payer: Self-pay | Admitting: *Deleted

## 2014-09-18 ENCOUNTER — Encounter: Payer: Self-pay | Admitting: Internal Medicine

## 2014-09-18 VITALS — BP 153/83 | HR 80 | Temp 98.9°F | Ht 70.0 in | Wt 242.5 lb

## 2014-09-18 DIAGNOSIS — T8452XA Infection and inflammatory reaction due to internal left hip prosthesis, initial encounter: Secondary | ICD-10-CM

## 2014-09-18 NOTE — Progress Notes (Addendum)
Patient ID: [name], female   DOB: 02/10/1956, 58 y.o.   MRN: [id number]         [facility]  Patient Active Problem List   Diagnosis Date Noted  . Septic arthritis of hip 08/24/2014    Patient's Medications  New Prescriptions   No medications on file  Previous Medications   ACETAMINOPHEN (TYLENOL) 325 MG TABLET    Take 2 tablets (650 mg total) by mouth every 6 (six) hours as needed for mild pain (or Fever >/= 101).   BISACODYL (DULCOLAX) 10 MG SUPPOSITORY    Place 1 suppository (10 mg total) rectally daily as needed for moderate constipation.   CITALOPRAM (CELEXA) 40 MG TABLET    Take 40 mg by mouth every morning.   CLOTRIMAZOLE-BETAMETHASONE (LOTRISONE) CREAM    Apply 1 application topically 2 (two) times daily as needed (rash in skin folds).   DAPTOMYCIN 650 MG IN SODIUM CHLORIDE 0.9 % 100 ML    Inject 650 mg into the vein daily. Daptomycin daily for a total of six weeks (38 more doses) via PICC Line.   DOCUSATE SODIUM (COLACE) 100 MG CAPSULE    Take 100-200 mg by mouth at bedtime as needed for mild constipation.   FUROSEMIDE (LASIX) 40 MG TABLET    Take 40 mg by mouth 2 (two) times daily as needed (fluid retention).   GABAPENTIN (NEURONTIN) 400 MG CAPSULE    Take 400 mg by mouth. ONE IN AM, ONE WITH LUNCH AND TWO AFTER DINNER   HYDROXYZINE (ATARAX/VISTARIL) 25 MG TABLET    Take 25 mg by mouth every 6 (six) hours as needed for anxiety.   IRON PO    Take 1 tablet by mouth every morning.   METHOCARBAMOL (ROBAXIN) 500 MG TABLET    Take 1 tablet (500 mg total) by mouth every 6 (six) hours as needed for muscle spasms.   METOCLOPRAMIDE (REGLAN) 5 MG TABLET    Take 1-2 tablets (5-10 mg total) by mouth every 8 (eight) hours as needed for nausea (if ondansetron (ZOFRAN) ineffective.).   NALOXEGOL OXALATE (MOVANTIK PO)    Take 25 mg by mouth at bedtime.    NICOTINE POLACRILEX (COMMIT) 4 MG LOZENGE    Take 4 mg by mouth as needed for smoking cessation.   OMEPRAZOLE (PRILOSEC) 20 MG CAPSULE    Take 20 mg by mouth every morning.   ONDANSETRON (ZOFRAN) 4 MG TABLET    Take 1 tablet (4 mg total) by mouth every 6 (six) hours as needed for nausea.   OXYBUTYNIN (DITROPAN) 5 MG TABLET    Take 5 mg by mouth 3 (three) times daily as needed for bladder spasms.    OXYCODONE 10 MG TABS    Take 1-2 tablets (10-20 mg total) by mouth every 3 (three) hours as needed for moderate pain, severe pain or breakthrough pain.   OXYMORPHONE (OPANA ER) 20 MG 12 HR TABLET    Take 1 tablet (20 mg total) by mouth every 12 (twelve) hours.   POLYETHYLENE GLYCOL (MIRALAX / GLYCOLAX) PACKET    Take 17 g by mouth daily as needed for mild constipation.   RIFAMPIN (RIFADIN) 300 MG CAPSULE    Take 1 capsule (300 mg total) by mouth 2 (two) times daily. DO NOT START UNTIL Tuesday  09/18/2014 WHEN THE PATIENT COMES OFF THE XARELTO.   SODIUM CHLORIDE (OCEAN) 0.65 % SOLN NASAL SPRAY    Place 2 sprays into both nostrils daily as needed for congestion.  SUMATRIPTAN (IMITREX) 100 MG TABLET    Take 100 mg by mouth once. May repeat in 2 hours if headache persists or recurs. NO MORE THAN 2 IN A 24 HOUR PERIOD   TRAZODONE (DESYREL) 150 MG TABLET    Take 150-300 mg by mouth at bedtime.    TRIAMCINOLONE CREAM (KENALOG) 0.1 %    Apply 1 application topically 2 (two) times daily. CALF OF BOTH LEGS - BECAUSE OF SKIN IRRITATION THOUGHT TO BE DUE TO SWELLING OF LEGS  Modified Medications   No medications on file  Discontinued Medications   RIVAROXABAN (XARELTO) 10 MG TABS TABLET    Take 1 tablet (10 mg total) by mouth daily with breakfast. Take Xarelto for two and a half more weeks, then discontinue Xarelto. Once the patient has completed the Xarelto, they may resume the 325 mg Aspirin daily. LAST DOSE ON Monday 09/17/2014.    Subjective: [name] is in for her hospital follow-up visit. She was seen by my partners when she was hospitalized in late October. She has been struggling with persistent  left prosthetic hip infection. Several years ago she had a left hip fracture and underwent open reduction internal fixation. She required a left total hip arthroplasty in 2012. She underwent revision arthroplasty in January of this year. She developed a postoperative MRSA infection in February. She was being treated by [facility]opedics and infectious disease [facility] [place]. She underwent incision and drainage with spacer placement in February. Operative cultures grew MRSA. She was initially treated with vancomycin but records indicate that she developed some renal insufficiency and leukopenia and was switched to daptomycin. A follow-up CT scan in March showed increased abscess formation and a percutaneous drain was replaced. It sounds like she had trouble obtaining daptomycin so she was switched to once weekly dabivancin. She completed 9 weeks of IV antibiotic therapy on April 14. At that time she was switched to oral trimethoprim sulfamethoxazole. She states that she did not have any problem tolerating her antibiotics but at some point she decided to stop taking the trimethoprim sulfamethoxazole on her own.  She sought a second opinion from Dr. Trudee GripFrank Graham. This led to her admission to the hospital on October 30 with the plan being revision arthroplasty. However at the time of surgery evidence of persistent infection was found. Dr. Despina Graham replaced the spacer and did further debridement. Operative Gram stain and cultures were negative. She was started back on daptomycin and has now completed 25 days of therapy. She was discharged to a skilled nursing facility but returned to home on November 17. Upon discharge her rivaroxaban was discontinued and she started on oral rifampin. She has had no problems tolerating her PICC or antibiotics. However she is just learned that her insurance will not cover home IV antibiotics. She is very frustrated by this and does not know what she will do. I have not  received any blood work from her home health agency.   Review of Systems: Pertinent items are noted in HPI.  Past Medical History  Diagnosis Date  . Tenosynovitis of fingers     RIGHT RING AND SMALL-HAD SURGERY 07/12/14 [facility]  . Bronchitis     dx  07-04-2014 (approx) will finished antibiotic 07-14-2014  . History of concussion     x4  --  no residual  . Migraines   . Depression   . Inability to ambulate due to hip     hx  left total hip  infection and removal arthroplasty /  uses w/c or crutches  . GERD (gastroesophageal reflux disease)   . H/O hiatal hernia   . Chronic constipation   . Chronic narcotic use   . Nocturia   . SUI (stress urinary incontinence, female)   . OA (osteoarthritis)     hips, knees, hips  . Infection of left prosthetic hip joint     left total arthroplasty done 2012/  jan 2015 removal of prosthesis  . Full dentures   . History of cellulitis     lower extremities  . Thinning of skin     easily tears  . Venous stasis dermatitis     BILATERAL LEGS  . Productive cough     RESOLVED - QUIT SMOKING  . Memory problem   . OSA on CPAP     study x3   last one few yrs ago  cpap recommended -- DOES NOT USE OR HAVE CPAP    History  Substance Use Topics  . Smoking status: Former Smoker -- 1.00 packs/day for 30 years    Types: Cigarettes    Quit date: 07/09/2014  . Smokeless tobacco: Never Used     Comment: PT STATES ATTEMPTING TO QUIT SMOKING 07-09-2014--  USING NICOTIN LOZENGERS  . Alcohol Use: No    No family history on file.  Allergies  Allergen Reactions  . Vancomycin Other (See Comments)    LEUKOPENIA    Objective: Temp: 98.9 F (37.2 C) (11/24 1554) Temp Source: Oral (11/24 1554) BP: 153/83 mmHg (11/24 1554) Pulse Rate: 80 (11/24 1554)  General: She is seated in her wheelchair. She is alert and talkative and in no distress. Skin: Right arm PICC site appears normal Lungs: Clear Cor: Regular S1 and S2 with no  murmur Abdomen: Obese, soft and nontender Left hip: She has a long incision laterally. Some Steri-Strips remain in place distally. The midportion of her wound is still open and there is some bloody drainage on her gauze dressing.  SED RATE (mm/hr)  Date Value  08/25/2014 17   CRP (mg/dL)  Date Value  09/81/191410/31/2015 2.2*      Assessment: Although recent operative cultures were negative I suspect that she has persistent MRSA infection. I will continue daptomycin at least through her follow-up visit on December 15 along with oral rifampin and then consider switching to a 2 drug oral regimen.  Plan: 1. Continue IV daptomycin daily through December 15; we are attempting to arrange [facility] 2. Continue oral rifampin 3. Check CBC, CMP, CK, sedimentation rate and C-reactive protein weekly 4. Follow-up here on December 15   Cliffton AstersJohn Anniebelle Devore, MD [facility] [phone] pager   [phone] cell 09/18/2014, 4:35 PM

## 2014-09-18 NOTE — Telephone Encounter (Signed)
Per [employer], cubicin does not need a prior authorization [phone] option 2. Patient prefers to receive IV antibiotics as an outpatient (due to cost) through [facility]. Spoke to Joy at [phone] ext [phone]. She will need order with dx and picc line instructions, demographics, insurance info. Med list and office note faxed to [phone]. Once patient is seen this afternoon the order will be faxed and she can receive her next dose at the specialty clinic tomorrow.

## 2014-09-27 ENCOUNTER — Telehealth: Payer: Self-pay | Admitting: *Deleted

## 2014-09-27 NOTE — Telephone Encounter (Signed)
Received most recent lab work from [facility] today.  Left message that her white cell count was increasing/improving.  Next appt w/ Dr. Orvan Falconerampbell 10/02/14.

## 2014-09-27 NOTE — Telephone Encounter (Signed)
Patient called back stating she still had more questions for MD on her lab results. If possible please call patient at [phone].

## 2014-09-28 ENCOUNTER — Telehealth: Payer: Self-pay | Admitting: *Deleted

## 2014-09-28 NOTE — Telephone Encounter (Signed)
Pt concerned about Sed Rate increasing.  Will discuss her concern further upon her return MD visit next Tuesday, Dec. 8.

## 2014-10-02 ENCOUNTER — Ambulatory Visit: Payer: Medicare Other | Admitting: Internal Medicine

## 2014-10-02 ENCOUNTER — Ambulatory Visit (INDEPENDENT_AMBULATORY_CARE_PROVIDER_SITE_OTHER): Payer: Medicare Other | Admitting: Internal Medicine

## 2014-10-02 ENCOUNTER — Encounter: Payer: Self-pay | Admitting: Internal Medicine

## 2014-10-02 VITALS — BP 136/84 | HR 88 | Temp 98.5°F

## 2014-10-02 DIAGNOSIS — T8452XA Infection and inflammatory reaction due to internal left hip prosthesis, initial encounter: Secondary | ICD-10-CM

## 2014-10-02 NOTE — Progress Notes (Addendum)
Patient ID: [name], female   DOB: 02/10/1956, 58 y.o.   MRN: [id number]         [facility] for Infectious Disease  Patient Active Problem List   Diagnosis Date Noted  . Septic arthritis of hip 08/24/2014    Patient's Medications  New Prescriptions   No medications on file  Previous Medications   ACETAMINOPHEN (TYLENOL) 325 MG TABLET    Take 2 tablets (650 mg total) by mouth every 6 (six) hours as needed for mild pain (or Fever >/= 101).   BISACODYL (DULCOLAX) 10 MG SUPPOSITORY    Place 1 suppository (10 mg total) rectally daily as needed for moderate constipation.   CITALOPRAM (CELEXA) 40 MG TABLET    Take 40 mg by mouth every morning.   CLOTRIMAZOLE-BETAMETHASONE (LOTRISONE) CREAM    Apply 1 application topically 2 (two) times daily as needed (rash in skin folds).   DAPTOMYCIN 650 MG IN SODIUM CHLORIDE 0.9 % 100 ML    Inject 650 mg into the vein daily. Daptomycin daily for a total of six weeks (38 more doses) via PICC Line.   DOCUSATE SODIUM (COLACE) 100 MG CAPSULE    Take 100-200 mg by mouth at bedtime as needed for mild constipation.   FUROSEMIDE (LASIX) 40 MG TABLET    Take 40 mg by mouth 2 (two) times daily as needed (fluid retention).   GABAPENTIN (NEURONTIN) 400 MG CAPSULE    Take 400 mg by mouth. ONE IN AM, ONE WITH LUNCH AND TWO AFTER DINNER   HYDROXYZINE (ATARAX/VISTARIL) 25 MG TABLET    Take 25 mg by mouth every 6 (six) hours as needed for anxiety.   IRON PO    Take 1 tablet by mouth every morning.   METHOCARBAMOL (ROBAXIN) 500 MG TABLET    Take 1 tablet (500 mg total) by mouth every 6 (six) hours as needed for muscle spasms.   METOCLOPRAMIDE (REGLAN) 5 MG TABLET    Take 1-2 tablets (5-10 mg total) by mouth every 8 (eight) hours as needed for nausea (if ondansetron (ZOFRAN) ineffective.).   NALOXEGOL OXALATE (MOVANTIK PO)    Take 25 mg by mouth at bedtime.    NICOTINE POLACRILEX (COMMIT) 4 MG LOZENGE    Take 4 mg by mouth as needed for smoking cessation.   OMEPRAZOLE (PRILOSEC) 20 MG CAPSULE    Take 20 mg by mouth every morning.   ONDANSETRON (ZOFRAN) 4 MG TABLET    Take 1 tablet (4 mg total) by mouth every 6 (six) hours as needed for nausea.   OXYBUTYNIN (DITROPAN) 5 MG TABLET    Take 5 mg by mouth 3 (three) times daily as needed for bladder spasms.    OXYCODONE 10 MG TABS    Take 1-2 tablets (10-20 mg total) by mouth every 3 (three) hours as needed for moderate pain, severe pain or breakthrough pain.   OXYMORPHONE (OPANA ER) 20 MG 12 HR TABLET    Take 1 tablet (20 mg total) by mouth every 12 (twelve) hours.   POLYETHYLENE GLYCOL (MIRALAX / GLYCOLAX) PACKET    Take 17 g by mouth daily as needed for mild constipation.   RIFAMPIN (RIFADIN) 300 MG CAPSULE    Take 1 capsule (300 mg total) by mouth 2 (two) times daily. DO NOT START UNTIL Tuesday  09/18/2014 WHEN THE PATIENT COMES OFF THE XARELTO.   SODIUM CHLORIDE (OCEAN) 0.65 % SOLN NASAL SPRAY    Place 2 sprays into both nostrils daily as needed for congestion.  SUMATRIPTAN (IMITREX) 100 MG TABLET    Take 100 mg by mouth once. May repeat in 2 hours if headache persists or recurs. NO MORE THAN 2 IN A 24 HOUR PERIOD   TRAZODONE (DESYREL) 150 MG TABLET    Take 150-300 mg by mouth at bedtime.    TRIAMCINOLONE CREAM (KENALOG) 0.1 %    Apply 1 application topically 2 (two) times daily. CALF OF BOTH LEGS - BECAUSE OF SKIN IRRITATION THOUGHT TO BE DUE TO SWELLING OF LEGS  Modified Medications   No medications on file  Discontinued Medications   No medications on file    Subjective: [name] is in for her routine follow-up visit. She is now completed 35 days of IV daptomycin following recent left hip debridement and exchange of cement spacer. She has a history of MRSA prosthetic hip infection that was first diagnosed and treated in January of this year at [facility]. She required repeat debridement in March. She had a long course of IV antibiotics and was converted to oral trimethoprim sulfamethoxazole which  he stopped sometime this past summer. She saw Dr. Hector Shade who had planned revision arthroplasty. However at the time of surgery he encountered very inflamed tissue. He decided to debride the tissue and exchange the spacer. Stains and cultures from that operation were negative. She's had no problems tolerating her PICC or daptomycin. She is also on oral rifampin. She is still having significant pain in her left hip. She states that over the past few days she has noted some bloody drainage on her gauze dressing. She has not had any fever, chills or sweats. Review of Systems: Pertinent items are noted in HPI.  Past Medical History  Diagnosis Date  . Tenosynovitis of fingers     RIGHT RING [facility]-HAD [facility]  . Bronchitis     dx  07-04-2014 (approx) will finished antibiotic 07-14-2014  . History of concussion     x4  --  no residual  . Migraines   . Depression   . 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 GERD (gastroesophageal reflux disease)   . H/O hiatal hernia   . Chronic constipation   . Chronic narcotic use   . Nocturia   . SUI (stress urinary incontinence, female)   . OA (osteoarthritis)     hips, knees, hips  . Infection of left prosthetic hip joint     left total arthroplasty done 2012/  jan 2015 removal of prosthesis  . Full dentures   . History of cellulitis     lower extremities  . Thinning of skin     easily tears  . Venous stasis dermatitis     BILATERAL LEGS  . Productive cough     RESOLVED - QUIT SMOKING  . Memory problem   . OSA on CPAP     study x3   last one few yrs ago  cpap recommended -- DOES NOT USE OR HAVE CPAP    History  Substance Use Topics  . Smoking status: Former Smoker -- 1.00 packs/day for 30 years    Types: Cigarettes    Quit date: 07/09/2014  . Smokeless tobacco: Never Used     Comment: PT STATES ATTEMPTING TO QUIT  SMOKING 07-09-2014--  USING NICOTIN LOZENGERS  . Alcohol Use: No    No family history on file.  Allergies  Allergen Reactions  . Vancomycin Other (See Comments)    LEUKOPENIA    Objective: Temp: 98.5 F (36.9 C) (12/08 1400) Temp Source: Oral (12/08 1400) BP: 136/84 mmHg (12/08 1400) Pulse Rate: 88 (12/08 1400)  General: She is frustrated. She is seated in her wheelchair Skin: PICC site appears normal Left hip: There is a small opening in the midportion of her healing surgical incision with some bloody drainage on the gauze dressing. There is no fluctuance. I cannot express any drainage. There is no surrounding cellulitis or unusual warmth.  Lab Results WBC 09/26/2014: 2900 (stable) Sedimentation rate 09/26/2014: 98 C-reactive protein 09/26/2014: Result not reported   Assessment: I suspect that she had smoldering MRSA infection. Her sedimentation rate has gone up recently but I do not see clear evidence of worsening hip infection. I will obtain repeat sedimentation rate and C-reactive protein to help decide how long to continue the IV antibiotics before converting over to a full oral regimen.  Plan: 1. Continue IV daptomycin and oral rifampin for now 2. Repeat sedimentation rate and C-reactive protein 3. Follow-up in 4 weeks   Michel Bickers, MD [facility] for [place] [phone] pager   [phone] cell 10/02/2014, 2:46 PM   Addendum:  ESR 10/03/2014: 56H CRP 10/03/2014: 6.5H  I will have her continue IV daptomycin and oral rifampin until her appointment on 11/01/2014.  Michel Bickers, MD [facility] for [facility] [place] [facility] [phone] pager   [phone] cell 10/08/2014, 11:12 AM

## 2014-10-03 ENCOUNTER — Other Ambulatory Visit: Payer: Self-pay | Admitting: *Deleted

## 2014-10-05 ENCOUNTER — Telehealth: Payer: Self-pay | Admitting: *Deleted

## 2014-10-05 NOTE — Telephone Encounter (Signed)
Patient with questions about continuing IV antibiotics or moving to oral antibiotics.  Patient is currently scheduled to stop IV antibiotics on Tuesday 12/15.  Please advise if she needs new orders based on recent lab work. Andree CossHowell, Casper Pagliuca M, RN

## 2014-10-08 ENCOUNTER — Telehealth: Payer: Self-pay | Admitting: *Deleted

## 2014-10-08 NOTE — Telephone Encounter (Signed)
Continue Daptomycin until 11/01/14, weekly CRP and Sed labs.  [facility]  Pt notified of weekly crp and sed rate.

## 2014-10-11 ENCOUNTER — Inpatient Hospital Stay (HOSPITAL_COMMUNITY)
Admission: EM | Admit: 2014-10-11 | Discharge: 2014-10-16 | DRG: 470 | Disposition: A | Discharge status: Skilled Nursing Facility | Payer: Medicare Other | Attending: Internal Medicine | Admitting: Internal Medicine

## 2014-10-11 ENCOUNTER — Other Ambulatory Visit: Payer: Self-pay | Admitting: *Deleted

## 2014-10-11 ENCOUNTER — Emergency Department (HOSPITAL_COMMUNITY): Payer: Medicare Other

## 2014-10-11 ENCOUNTER — Encounter (HOSPITAL_COMMUNITY): Payer: Self-pay | Admitting: *Deleted

## 2014-10-11 DIAGNOSIS — S73005A Unspecified dislocation of left hip, initial encounter: Secondary | ICD-10-CM

## 2014-10-11 DIAGNOSIS — D709 Neutropenia, unspecified: Secondary | ICD-10-CM | POA: Diagnosis present

## 2014-10-11 DIAGNOSIS — E46 Unspecified protein-calorie malnutrition: Secondary | ICD-10-CM | POA: Diagnosis present

## 2014-10-11 DIAGNOSIS — K2100 Gastro-esophageal reflux disease with esophagitis, without bleeding: Secondary | ICD-10-CM

## 2014-10-11 DIAGNOSIS — M25552 Pain in left hip: Secondary | ICD-10-CM | POA: Diagnosis not present

## 2014-10-11 DIAGNOSIS — Z881 Allergy status to other antibiotic agents status: Secondary | ICD-10-CM

## 2014-10-11 DIAGNOSIS — M659 Synovitis and tenosynovitis, unspecified: Secondary | ICD-10-CM | POA: Diagnosis present

## 2014-10-11 DIAGNOSIS — T8452XA Infection and inflammatory reaction due to internal left hip prosthesis, initial encounter: Principal | ICD-10-CM | POA: Diagnosis present

## 2014-10-11 DIAGNOSIS — Z79891 Long term (current) use of opiate analgesic: Secondary | ICD-10-CM

## 2014-10-11 DIAGNOSIS — F329 Major depressive disorder, single episode, unspecified: Secondary | ICD-10-CM | POA: Diagnosis present

## 2014-10-11 DIAGNOSIS — I872 Venous insufficiency (chronic) (peripheral): Secondary | ICD-10-CM | POA: Diagnosis present

## 2014-10-11 DIAGNOSIS — D62 Acute posthemorrhagic anemia: Secondary | ICD-10-CM | POA: Diagnosis not present

## 2014-10-11 DIAGNOSIS — M1610 Unilateral primary osteoarthritis, unspecified hip: Secondary | ICD-10-CM | POA: Diagnosis present

## 2014-10-11 DIAGNOSIS — G4733 Obstructive sleep apnea (adult) (pediatric): Secondary | ICD-10-CM

## 2014-10-11 DIAGNOSIS — D61818 Other pancytopenia: Secondary | ICD-10-CM | POA: Diagnosis present

## 2014-10-11 DIAGNOSIS — F32A Depression, unspecified: Secondary | ICD-10-CM

## 2014-10-11 DIAGNOSIS — M1990 Unspecified osteoarthritis, unspecified site: Secondary | ICD-10-CM | POA: Diagnosis present

## 2014-10-11 DIAGNOSIS — E559 Vitamin D deficiency, unspecified: Secondary | ICD-10-CM | POA: Diagnosis present

## 2014-10-11 DIAGNOSIS — Z96642 Presence of left artificial hip joint: Secondary | ICD-10-CM

## 2014-10-11 DIAGNOSIS — Z9989 Dependence on other enabling machines and devices: Secondary | ICD-10-CM

## 2014-10-11 DIAGNOSIS — M009 Pyogenic arthritis, unspecified: Secondary | ICD-10-CM | POA: Diagnosis present

## 2014-10-11 DIAGNOSIS — Z6836 Body mass index (BMI) 36.0-36.9, adult: Secondary | ICD-10-CM

## 2014-10-11 MED ORDER — HYDROMORPHONE HCL 1 MG/ML IJ SOLN
1.0000 mg | Freq: Once | INTRAMUSCULAR | Status: AC
  Administered 2014-10-11: 1 mg via INTRAVENOUS
  Filled 2014-10-11: qty 1

## 2014-10-11 MED ORDER — RIFAMPIN 300 MG PO CAPS: 300.0000 mg | ORAL_CAPSULE | Freq: Two times a day (BID) | ORAL | Status: DC

## 2014-10-11 NOTE — ED Notes (Signed)
Pt states having L hip pain, states she had hip replacement in jan, had to have it "taken out" in feb d/t infection, pt states her L hip is dislocated, states her Dr. Jillyn HiddenBean ([place] [facility]) is going to come and put back in place, pt states they want to be called when pt is in room.

## 2014-10-11 NOTE — ED Provider Notes (Signed)
CSN: [id number]     Arrival date & time 10/11/14  2035 History   First MD Initiated Contact with Patient 10/11/14 2154     Chief Complaint  Patient presents with  . Hip Pain    left     (Consider location/radiation/quality/duration/timing/severity/associated sxs/prior Treatment) The history is provided by the patient and medical records. No language interpreter was used.     [name] is a 58 y.o. female  with a hx of migraines, depression, GERD, obesity, history of arthritis, infection of the left prosthetic hip joint nonambulatory, venous stasis dermatitis, obstructive sleep apnea presents to the Emergency Department complaining of gradual, persistent, progressively worsening left hip pain onset 1.5-2 weeks ago.  Pt reports she has not put weight on the hip or fallen.  Pt reports she went to [facility] for her abx infusion and was given an x-ray.  Pt's first arthroplasty was in Feb 2012.  In Jan 2015 she had the arthoplasty replaced.  Pt then developed an infection in the hip requiring surgical debridement in Feb 2015.  Pt reports several months of IV abx with transition to Oral abx.  Pt then transferred care to Dr. Despina Hick on Oct 31st.  At that time pt's hip continued to be infected.  Pt now with abx spacers in place.  Pt reports that based on the x-ray this afternoon the spacer is dislocated.  Patient reports her pain is worse than usual in spite of her use of Opana and oxycodone as her normal pain regimen.  Patient denies fever, chills, headache, neck pain, chest pain, shortness of breath, abdominal pain, nausea, vomiting, diarrhea, weakness, dizziness, syncope.  [facility]-rays from [facility]  PCP: Carolynn Serve [facility]  Past Medical History  Diagnosis Date  . Tenosynovitis of fingers     RIGHT RING AND SMALL-HAD SURGERY 07/12/14 [facility]  . Bronchitis     dx  07-04-2014 (approx) will finished antibiotic 07-14-2014  .  History of concussion     x4  --  no residual  . Migraines   . Depression   . 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 GERD (gastroesophageal reflux disease)   . H/O hiatal hernia   . Chronic constipation   . Chronic narcotic use   . Nocturia   . SUI (stress urinary incontinence, female)   . OA (osteoarthritis)     hips, knees, hips  . Infection of left prosthetic hip joint     left total arthroplasty done 2012/  jan 2015 removal of prosthesis  . Full dentures   . History of cellulitis     lower extremities  . Thinning of skin     easily tears  . Venous stasis dermatitis     BILATERAL LEGS  . Productive cough     RESOLVED - QUIT SMOKING  . Memory problem   . OSA on CPAP     study x3   last one few yrs ago  cpap recommended -- DOES NOT USE OR HAVE CPAP   Past Surgical History  Procedure Laterality Date  . Appendectomy  40 (age 6)  . 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 Orif left foot fx  1990's  . Orif right tibial fx  2008    hardware removed same year  . Open repair and fixation right tibia and knee  X2  2009  . Total knee arthroplasty Right X2  2009  &  2010  . Total knee arthroplasty Left 2009  . Knee arthroscopy Left 2009  . Orif pelvic fx's  11 /2011  . Total hip arthroplasty Left 2012  . Revision total hip arthroplasty Left 01/ 2015  . Removal prothesis  left hip arthroplasty  02/ 2015    INFECTION  . Laparoscopic cholecystectomy  08-12-2003  . Tenosynovectomy Right 07/12/2014    Procedure: RIGHT RING/SMALL FINGER TENOSYNOVECTOMY;  Surgeon: Sharma CovertFred W Ortmann, MD;  Location[facility];  Service: Orthopedics;  Laterality: Right;  . Total hip revision Left 08/24/2014    Procedure: INCISION AND DRAINAGE LEFT HIP, EXCHANGE ANTIBIOTIC SPACER, OPEN REDUCTION LEFT FEMUR FRACTURE ;  Surgeon:  Loanne DrillingFrank Aluisio V, MD;  Location: [facility];  Service: Orthopedics;  Laterality: Left;   No family history on file. History  Substance Use Topics  . Smoking status: Former Smoker -- 1.00 packs/day for 30 years    Types: Cigarettes    Quit date: 07/09/2014  . Smokeless tobacco: Never Used     Comment: PT STATES ATTEMPTING TO QUIT SMOKING 07-09-2014--  USING NICOTIN LOZENGERS  . Alcohol Use: No   OB History    No data available     Review of Systems  Constitutional: Negative for fever, diaphoresis, appetite change, fatigue and unexpected weight change.  HENT: Negative for mouth sores.   Eyes: Negative for visual disturbance.  Respiratory: Negative for cough, chest tightness, shortness of breath and wheezing.   Cardiovascular: Negative for chest pain.  Gastrointestinal: Negative for nausea, vomiting, abdominal pain, diarrhea and constipation.  Endocrine: Negative for polydipsia, polyphagia and polyuria.  Genitourinary: Negative for dysuria, urgency, frequency and hematuria.  Musculoskeletal: Positive for arthralgias (left hip). Negative for back pain and neck stiffness.  Skin: Negative for rash.  Allergic/Immunologic: Negative for immunocompromised state.  Neurological: Negative for syncope, light-headedness and headaches.  Hematological: Does not bruise/bleed easily.  Psychiatric/Behavioral: Negative for sleep disturbance. The patient is not nervous/anxious.       Allergies  Vancomycin  Home Medications   Prior to Admission medications   Medication Sig Start Date End Date Taking? Authorizing Provider  aspirin 325 MG tablet Take 325 mg by mouth daily.    Yes Historical Provider, MD  clotrimazole-betamethasone (LOTRISONE) cream Apply 1 application topically 2 (two) times daily as needed (rash in skin folds).   Yes Historical Provider, MD  DAPTOmycin 650 mg in sodium chloride 0.9 % 100 mL Inject 650 mg into the vein daily. Daptomycin daily for a total of six weeks (38 more doses)  via PICC Line. 08/29/14  Yes Avel Peacerew Perkins, PA-C  DULoxetine (CYMBALTA) 60 MG capsule Take 60 mg by mouth daily.  09/24/14 09/24/15 Yes Historical Provider, MD  esomeprazole (NEXIUM) 40 MG capsule Take 40 mg by mouth. 09/24/14 09/24/15 Yes Historical Provider, MD  furosemide (LASIX) 40 MG tablet Take 40 mg by mouth 2 (two) times daily as needed (fluid retention).   Yes Historical Provider, MD  gabapentin (NEURONTIN) 400 MG capsule Take 400-800 mg by mouth 4 (four) times daily. Take 1 Tablet between 6:00-7:30 am, Take 1 Tablet at 11 am, Take 2 Tablets at 4 pm & Take 2 Tablets after 8:30 pm   Yes Historical Provider, MD  hydrOXYzine (ATARAX/VISTARIL) 25 MG tablet Take 25 mg by mouth every 6 (six) hours  as needed for anxiety (anxiety).    Yes Historical Provider, MD  IRON PO Take 1 tablet by mouth every morning.   Yes Historical Provider, MD  meloxicam (MOBIC) 7.5 MG tablet Take 7.5 mg by mouth daily.    Yes Historical Provider, MD  Multiple Vitamin (MULTIVITAMIN) tablet Take 1 tablet by mouth at bedtime.    Yes Historical Provider, MD  Naloxegol Oxalate (MOVANTIK PO) Take 25 mg by mouth at bedtime.    Yes Historical Provider, MD  nicotine polacrilex (COMMIT) 4 MG lozenge Take 4 mg by mouth as needed for smoking cessation (smoking cessation).    Yes Historical Provider, MD  ondansetron (ZOFRAN) 4 MG tablet Take 1 tablet (4 mg total) by mouth every 6 (six) hours as needed for nausea. 08/29/14  Yes Avel Peace, PA-C  oxyCODONE 10 MG TABS Take 1-2 tablets (10-20 mg total) by mouth every 3 (three) hours as needed for moderate pain, severe pain or breakthrough pain. 08/29/14  Yes Avel Peace, PA-C  oxymorphone (OPANA ER) 20 MG 12 hr tablet Take 1 tablet (20 mg total) by mouth every 12 (twelve) hours. Patient taking differently: Take 20 mg by mouth 3 (three) times daily.  08/29/14  Yes Avel Peace, PA-C  rifampin (RIFADIN) 300 MG capsule Take 1 capsule (300 mg total) by mouth 2 (two) times daily. DO NOT START  UNTIL Tuesday  09/18/2014 WHEN THE PATIENT COMES OFF THE XARELTO. 10/11/14  Yes Cliffton Asters, MD  solifenacin (VESICARE) 10 MG tablet Take 10 mg by mouth at bedtime.  09/24/14 09/24/15 Yes Historical Provider, MD  SUMAtriptan (IMITREX) 100 MG tablet Take 100 mg by mouth once. May repeat in 2 hours if headache persists or recurs. NO MORE THAN 2 IN A 24 HOUR PERIOD   Yes Historical Provider, MD  traZODone (DESYREL) 150 MG tablet Take 150-300 mg by mouth at bedtime.    Yes Historical Provider, MD  triamcinolone cream (KENALOG) 0.1 % Apply 1 application topically 2 (two) times daily. CALF OF BOTH LEGS - BECAUSE OF SKIN IRRITATION THOUGHT TO BE DUE TO SWELLING OF LEGS   Yes Historical Provider, MD  acetaminophen (TYLENOL) 325 MG tablet Take 2 tablets (650 mg total) by mouth every 6 (six) hours as needed for mild pain (or Fever >/= 101). 08/29/14   Avel Peace, PA-C  bisacodyl (DULCOLAX) 10 MG suppository Place 1 suppository (10 mg total) rectally daily as needed for moderate constipation. 08/29/14   Avel Peace, PA-C  docusate sodium (COLACE) 100 MG capsule Take 100-200 mg by mouth at bedtime as needed for mild constipation.    Historical Provider, MD  methocarbamol (ROBAXIN) 500 MG tablet Take 1 tablet (500 mg total) by mouth every 6 (six) hours as needed for muscle spasms. 08/29/14   Avel Peace, PA-C  metoCLOPramide (REGLAN) 5 MG tablet Take 1-2 tablets (5-10 mg total) by mouth every 8 (eight) hours as needed for nausea (if ondansetron (ZOFRAN) ineffective.). 08/29/14   Avel Peace, PA-C  sodium chloride (OCEAN) 0.65 % SOLN nasal spray Place 2 sprays into both nostrils daily as needed for congestion.    Historical Provider, MD   BP 123/49 mmHg  Pulse 79  Temp(Src) 98 F (36.7 C) (Oral)  Resp 18  SpO2 97% Physical Exam  Constitutional: She appears well-developed and well-nourished. No distress.  HENT:  Head: Normocephalic and atraumatic.  Eyes: Conjunctivae are normal.  Neck: Normal range of  motion.  Cardiovascular: Normal rate, regular rhythm, normal heart sounds and intact distal pulses.  No murmur heard. Capillary refill < 3 sec in BLE  Pulmonary/Chest: Effort normal and breath sounds normal.  Musculoskeletal: She exhibits tenderness. She exhibits no edema.  ROM: Full range of motion of the left ankle and toes of the left foot No range of motion of the left hip or left knee Surgical incision with small amount of drainage on the bandage Tenderness to palpation of the left hip but no palpable deformity due to patient's obesity Left leg is shortened several inches but is not rotated  Neurological: She is alert. Coordination normal.  Sensation intact to dull and sharp in the left lower extremity Strength 5/5 in the left ankle and toes, 0 out of 5 in the left knee and hip  Skin: Skin is warm and dry. She is not diaphoretic.  No tenting of the skin Venous stasis changes of the left lower leg  Psychiatric: She has a normal mood and affect.  Nursing note and vitals reviewed.   ED Course  Procedures (including critical care time) Labs Review Labs Reviewed  CBC - Abnormal; Notable for the following:    WBC 2.1 (*)    RBC 3.59 (*)    Hemoglobin 10.7 (*)    HCT 32.5 (*)    Platelets 97 (*)    All other components within normal limits  BASIC METABOLIC PANEL - Abnormal; Notable for the following:    Glucose, Bld 100 (*)    All other components within normal limits    Imaging Review Dg Hip Complete Left  10/12/2014   CLINICAL DATA:  Left hip pain.  EXAM: LEFT HIP - COMPLETE 2+ VIEW  COMPARISON:  08/29/2014  FINDINGS: There is a left hip prosthesis with antibiotic spacer. The prosthesis is dislocated superiorly.  Remote fracture of the left pelvis traversing the roof of the acetabulum, which is distorted.  Remote periprosthetic fracture through the left femoral diaphysis, with plate and cerclage wire fixation. The fracture is still visible, but without interval displacement.   Negative right hip.  IMPRESSION: 1.  Dislocated prosthetic left hip, including antibiotic spacer. 2. Unchanged appearance of treated periprosthetic fracture in the left femoral diaphysis. 3. Unchanged appearance of remote left acetabular fracture.   Electronically Signed   By: Tiburcio PeaJonathan  Watts M.D.   On: 10/12/2014 00:33     EKG Interpretation None      MDM   Final diagnoses:  Left hip pain  Hip dislocation, left, initial encounter   [name] presents [facility] complaining of a dislocated left hip and increased left hip pain for the last 1-1/2-2 weeks.  She reports that [facility] is expecting her to arrive here for relocation of the hip. We will begin workup and consult [facility].  Pt is neurovascularly intact.  No ROM of the left hip or left knee  11:05 PM Patient discussed with Dr. Sigurd SosBeane [facility]. He reports that he was unaware of the patient's arrival in the emergency department or the plan for his evaluation. He recommends reimaging the left hip to obtain usable images and medical admission. He reports we should plan to have Dr. Despina HickAlusio evaluate her in the morning.  12:49 AM X-RAY with Dislocated prosthetic left hip, including antibiotic spacer; Unchanged appearance of treated periprosthetic fracture in the left femoral diaphysis; Unchanged appearance of remote left acetabular fracture.  Dr. Shelle IronBeane repaged to discuss x-rays.    Pt repositioned with assistance and hip does appear to have some deformity when reassessed in additional positions.  Incisions remains barely open with minor amount of drainage in the last 24 hours and no erythema or induration of the skin.     1:04 AM Discussed with Dr. Clyde Lundborg who will admit to medsurg with continuation of her abx and ortho consult in the AM.    BP 123/49 mmHg  Pulse 79  Temp(Src) 98 F (36.7 C) (Oral)  Resp 18  SpO2 97%   Dierdre Forth, PA-C 10/12/14  0107  Ethelda Chick, MD 10/12/14 1526

## 2014-10-12 ENCOUNTER — Encounter (HOSPITAL_COMMUNITY): Payer: Self-pay | Admitting: Internal Medicine

## 2014-10-12 DIAGNOSIS — F32A Depression, unspecified: Secondary | ICD-10-CM | POA: Diagnosis present

## 2014-10-12 DIAGNOSIS — T8452XA Infection and inflammatory reaction due to internal left hip prosthesis, initial encounter: Secondary | ICD-10-CM | POA: Diagnosis present

## 2014-10-12 DIAGNOSIS — K219 Gastro-esophageal reflux disease without esophagitis: Secondary | ICD-10-CM | POA: Insufficient documentation

## 2014-10-12 DIAGNOSIS — I872 Venous insufficiency (chronic) (peripheral): Secondary | ICD-10-CM | POA: Diagnosis present

## 2014-10-12 DIAGNOSIS — D61818 Other pancytopenia: Secondary | ICD-10-CM | POA: Diagnosis present

## 2014-10-12 DIAGNOSIS — Z79891 Long term (current) use of opiate analgesic: Secondary | ICD-10-CM | POA: Diagnosis not present

## 2014-10-12 DIAGNOSIS — M009 Pyogenic arthritis, unspecified: Secondary | ICD-10-CM

## 2014-10-12 DIAGNOSIS — M25552 Pain in left hip: Secondary | ICD-10-CM

## 2014-10-12 DIAGNOSIS — M1990 Unspecified osteoarthritis, unspecified site: Secondary | ICD-10-CM | POA: Diagnosis present

## 2014-10-12 DIAGNOSIS — M1610 Unilateral primary osteoarthritis, unspecified hip: Secondary | ICD-10-CM | POA: Diagnosis present

## 2014-10-12 DIAGNOSIS — E46 Unspecified protein-calorie malnutrition: Secondary | ICD-10-CM | POA: Diagnosis present

## 2014-10-12 DIAGNOSIS — Z6836 Body mass index (BMI) 36.0-36.9, adult: Secondary | ICD-10-CM | POA: Diagnosis not present

## 2014-10-12 DIAGNOSIS — F329 Major depressive disorder, single episode, unspecified: Secondary | ICD-10-CM | POA: Diagnosis present

## 2014-10-12 DIAGNOSIS — M659 Synovitis and tenosynovitis, unspecified: Secondary | ICD-10-CM | POA: Diagnosis present

## 2014-10-12 DIAGNOSIS — G4733 Obstructive sleep apnea (adult) (pediatric): Secondary | ICD-10-CM | POA: Diagnosis present

## 2014-10-12 DIAGNOSIS — Z9989 Dependence on other enabling machines and devices: Secondary | ICD-10-CM

## 2014-10-12 DIAGNOSIS — S73005S Unspecified dislocation of left hip, sequela: Secondary | ICD-10-CM

## 2014-10-12 DIAGNOSIS — Z881 Allergy status to other antibiotic agents status: Secondary | ICD-10-CM | POA: Diagnosis not present

## 2014-10-12 DIAGNOSIS — K2100 Gastro-esophageal reflux disease with esophagitis, without bleeding: Secondary | ICD-10-CM | POA: Diagnosis present

## 2014-10-12 DIAGNOSIS — S73005A Unspecified dislocation of left hip, initial encounter: Secondary | ICD-10-CM

## 2014-10-12 DIAGNOSIS — D62 Acute posthemorrhagic anemia: Secondary | ICD-10-CM | POA: Diagnosis not present

## 2014-10-12 DIAGNOSIS — D709 Neutropenia, unspecified: Secondary | ICD-10-CM | POA: Diagnosis present

## 2014-10-12 DIAGNOSIS — E559 Vitamin D deficiency, unspecified: Secondary | ICD-10-CM | POA: Diagnosis present

## 2014-10-12 LAB — COMPREHENSIVE METABOLIC PANEL
ALK PHOS: 104 U/L (ref 39–117)
ALT: 11 U/L (ref 0–35)
ANION GAP: 12 (ref 5–15)
AST: 25 U/L (ref 0–37)
Albumin: 3.4 g/dL — ABNORMAL LOW (ref 3.5–5.2)
BUN: 10 mg/dL (ref 6–23)
CHLORIDE: 99 meq/L (ref 96–112)
CO2: 26 meq/L (ref 19–32)
CREATININE: 0.65 mg/dL (ref 0.50–1.10)
Calcium: 9.5 mg/dL (ref 8.4–10.5)
GFR calc non Af Amer: >90 mL/min (ref >90)
Glucose, Bld: 99 mg/dL (ref 70–99)
POTASSIUM: 4.3 meq/L (ref 3.7–5.3)
SODIUM: 137 meq/L (ref 137–147)
TOTAL PROTEIN: 7.3 g/dL (ref 6.0–8.3)
Total Bilirubin: 0.3 mg/dL (ref 0.3–1.2)

## 2014-10-12 LAB — CBC
HCT: 32.1 % — ABNORMAL LOW (ref 36.0–46.0)
HEMATOCRIT: 32.5 % — AB (ref 36.0–46.0)
HEMOGLOBIN: 10.7 g/dL — AB (ref 12.0–15.0)
Hemoglobin: 10.7 g/dL — ABNORMAL LOW (ref 12.0–15.0)
MCH: 29.8 pg (ref 26.0–34.0)
MCH: 29.9 pg (ref 26.0–34.0)
MCHC: 32.9 g/dL (ref 30.0–36.0)
MCHC: 33.3 g/dL (ref 30.0–36.0)
MCV: 90.5 fL (ref 78.0–100.0)
MCV: 89.7 fL (ref 78.0–100.0)
Platelets: 97 10*3/uL — ABNORMAL LOW (ref 150–400)
Platelets: 90 10*3/uL — ABNORMAL LOW (ref 150–400)
RBC: 3.59 MIL/uL — AB (ref 3.87–5.11)
RBC: 3.58 MIL/uL — AB (ref 3.87–5.11)
RDW: 13.3 % (ref 11.5–15.5)
RDW: 13.2 % (ref 11.5–15.5)
WBC: 2.1 10*3/uL — ABNORMAL LOW (ref 4.0–10.5)
WBC: 2.3 10*3/uL — ABNORMAL LOW (ref 4.0–10.5)

## 2014-10-12 LAB — BASIC METABOLIC PANEL
Anion gap: 11 (ref 5–15)
BUN: 11 mg/dL (ref 6–23)
CHLORIDE: 100 meq/L (ref 96–112)
CO2: 26 meq/L (ref 19–32)
CREATININE: 0.65 mg/dL (ref 0.50–1.10)
Calcium: 9.4 mg/dL (ref 8.4–10.5)
GFR calc Af Amer: >90 mL/min (ref >90)
GFR calc non Af Amer: >90 mL/min (ref >90)
Glucose, Bld: 100 mg/dL — ABNORMAL HIGH (ref 70–99)
Potassium: 4.5 mEq/L (ref 3.7–5.3)
Sodium: 137 mEq/L (ref 137–147)

## 2014-10-12 LAB — PROTIME-INR
INR: 1.03 (ref 0.00–1.49)
PROTHROMBIN TIME: 13.6 s (ref 11.6–15.2)

## 2014-10-12 MED ORDER — METHOCARBAMOL 500 MG PO TABS
500.0000 mg | ORAL_TABLET | Freq: Four times a day (QID) | ORAL | Status: DC | PRN
  Administered 2014-10-13 (×3): 500 mg via ORAL
  Filled 2014-10-12 (×4): qty 1

## 2014-10-12 MED ORDER — FERROUS SULFATE 325 (65 FE) MG PO TABS
325.0000 mg | ORAL_TABLET | ORAL | Status: DC
  Administered 2014-10-13 – 2014-10-16 (×3): 325 mg via ORAL
  Filled 2014-10-12 (×6): qty 1

## 2014-10-12 MED ORDER — SALINE SPRAY 0.65 % NA SOLN
2.0000 | Freq: Every day | NASAL | Status: DC | PRN
  Filled 2014-10-12: qty 44

## 2014-10-12 MED ORDER — MELOXICAM 7.5 MG PO TABS
7.5000 mg | ORAL_TABLET | Freq: Every day | ORAL | Status: DC
  Filled 2014-10-12: qty 1

## 2014-10-12 MED ORDER — DARIFENACIN HYDROBROMIDE ER 15 MG PO TB24
15.0000 mg | ORAL_TABLET | Freq: Every day | ORAL | Status: DC
  Administered 2014-10-12 – 2014-10-15 (×4): 15 mg via ORAL
  Filled 2014-10-12 (×6): qty 1

## 2014-10-12 MED ORDER — DOCUSATE SODIUM 100 MG PO CAPS: 100.0000 mg | ORAL_CAPSULE | Freq: Every evening | ORAL | Status: DC | PRN

## 2014-10-12 MED ORDER — PANTOPRAZOLE SODIUM 40 MG PO TBEC: 40.0000 mg | DELAYED_RELEASE_TABLET | Freq: Every day | ORAL | Status: DC

## 2014-10-12 MED ORDER — RIFAMPIN 300 MG PO CAPS
300.0000 mg | ORAL_CAPSULE | Freq: Two times a day (BID) | ORAL | Status: DC
  Administered 2014-10-12 – 2014-10-16 (×8): 300 mg via ORAL
  Filled 2014-10-12 (×11): qty 1

## 2014-10-12 MED ORDER — ENOXAPARIN SODIUM 40 MG/0.4ML ~~LOC~~ SOLN
40.0000 mg | Freq: Once | SUBCUTANEOUS | Status: AC
  Administered 2014-10-12: 40 mg via SUBCUTANEOUS
  Filled 2014-10-12: qty 0.4

## 2014-10-12 MED ORDER — CLOTRIMAZOLE 1 % EX CREA
TOPICAL_CREAM | Freq: Two times a day (BID) | CUTANEOUS | Status: DC
  Administered 2014-10-12 – 2014-10-15 (×3): via TOPICAL
  Filled 2014-10-12: qty 15

## 2014-10-12 MED ORDER — ONDANSETRON HCL 4 MG PO TABS
4.0000 mg | ORAL_TABLET | Freq: Four times a day (QID) | ORAL | Status: DC | PRN
  Administered 2014-10-12 – 2014-10-13 (×2): 4 mg via ORAL
  Filled 2014-10-12 (×2): qty 1

## 2014-10-12 MED ORDER — SACCHAROMYCES BOULARDII 250 MG PO CAPS
250.0000 mg | ORAL_CAPSULE | Freq: Two times a day (BID) | ORAL | Status: DC
  Administered 2014-10-12 – 2014-10-16 (×8): 250 mg via ORAL
  Filled 2014-10-12 (×10): qty 1

## 2014-10-12 MED ORDER — SODIUM CHLORIDE 0.9 % IJ SOLN: 3.0000 mL | Freq: Two times a day (BID) | INTRAMUSCULAR | Status: DC

## 2014-10-12 MED ORDER — ACETAMINOPHEN 325 MG PO TABS: 650.0000 mg | ORAL_TABLET | Freq: Four times a day (QID) | ORAL | Status: DC | PRN

## 2014-10-12 MED ORDER — ASPIRIN 325 MG PO TABS
325.0000 mg | ORAL_TABLET | Freq: Every day | ORAL | Status: DC
  Filled 2014-10-12: qty 1

## 2014-10-12 MED ORDER — METOCLOPRAMIDE HCL 10 MG PO TABS: 5.0000 mg | ORAL_TABLET | Freq: Three times a day (TID) | ORAL | Status: DC | PRN

## 2014-10-12 MED ORDER — OXYCODONE HCL 5 MG PO TABS
10.0000 mg | ORAL_TABLET | ORAL | Status: DC | PRN
  Administered 2014-10-12 – 2014-10-16 (×13): 20 mg via ORAL
  Filled 2014-10-12 (×14): qty 4

## 2014-10-12 MED ORDER — SODIUM CHLORIDE 0.9 % IV SOLN
250.0000 mL | INTRAVENOUS | Status: DC | PRN
  Administered 2014-10-14: 250 mL via INTRAVENOUS

## 2014-10-12 MED ORDER — OXYMORPHONE HCL ER 20 MG PO TB12
20.0000 mg | ORAL_TABLET | Freq: Three times a day (TID) | ORAL | Status: DC
  Administered 2014-10-12 – 2014-10-16 (×11): 20 mg via ORAL
  Filled 2014-10-12 (×11): qty 1

## 2014-10-12 MED ORDER — SODIUM CHLORIDE 0.9 % IV SOLN
650.0000 mg | INTRAVENOUS | Status: DC
  Administered 2014-10-12 – 2014-10-15 (×3): 650 mg via INTRAVENOUS
  Filled 2014-10-12 (×5): qty 13

## 2014-10-12 MED ORDER — NICOTINE POLACRILEX 4 MG MT LOZG
4.0000 mg | LOZENGE | OROMUCOSAL | Status: DC | PRN
  Filled 2014-10-12: qty 1

## 2014-10-12 MED ORDER — HYDROXYZINE HCL 25 MG PO TABS
25.0000 mg | ORAL_TABLET | Freq: Four times a day (QID) | ORAL | Status: DC | PRN
  Administered 2014-10-12 – 2014-10-16 (×7): 25 mg via ORAL
  Filled 2014-10-12 (×11): qty 1

## 2014-10-12 MED ORDER — NON FORMULARY: 40.0000 mg | Freq: Every day | Status: DC

## 2014-10-12 MED ORDER — GABAPENTIN 400 MG PO CAPS
400.0000 mg | ORAL_CAPSULE | ORAL | Status: DC
  Administered 2014-10-12 – 2014-10-13 (×3): 400 mg via ORAL
  Filled 2014-10-12 (×5): qty 1

## 2014-10-12 MED ORDER — DULOXETINE HCL 60 MG PO CPEP
60.0000 mg | ORAL_CAPSULE | Freq: Every day | ORAL | Status: DC
  Administered 2014-10-13 – 2014-10-16 (×3): 60 mg via ORAL
  Filled 2014-10-12 (×5): qty 1

## 2014-10-12 MED ORDER — GABAPENTIN 400 MG PO CAPS
800.0000 mg | ORAL_CAPSULE | ORAL | Status: DC
  Administered 2014-10-12 – 2014-10-13 (×3): 800 mg via ORAL
  Filled 2014-10-12 (×4): qty 2

## 2014-10-12 MED ORDER — SODIUM CHLORIDE 0.9 % IJ SOLN
10.0000 mL | INTRAMUSCULAR | Status: DC | PRN
  Administered 2014-10-13 – 2014-10-15 (×3): 10 mL
  Filled 2014-10-12 (×3): qty 40

## 2014-10-12 MED ORDER — SODIUM CHLORIDE 0.9 % IJ SOLN: 3.0000 mL | INTRAMUSCULAR | Status: DC | PRN

## 2014-10-12 MED ORDER — HYDROMORPHONE HCL 1 MG/ML IJ SOLN
0.5000 mg | INTRAMUSCULAR | Status: DC | PRN
  Administered 2014-10-12 – 2014-10-15 (×22): 1 mg via INTRAVENOUS
  Filled 2014-10-12 (×22): qty 1

## 2014-10-12 MED ORDER — OXYMORPHONE HCL ER 20 MG PO TB12: 20.0000 mg | ORAL_TABLET | Freq: Two times a day (BID) | ORAL | Status: DC

## 2014-10-12 MED ORDER — TRAZODONE HCL 150 MG PO TABS
150.0000 mg | ORAL_TABLET | Freq: Every day | ORAL | Status: DC
  Administered 2014-10-12 – 2014-10-14 (×3): 300 mg via ORAL
  Administered 2014-10-15: 150 mg via ORAL
  Filled 2014-10-12 (×6): qty 2

## 2014-10-12 MED ORDER — ESOMEPRAZOLE MAGNESIUM 40 MG PO CPDR
40.0000 mg | DELAYED_RELEASE_CAPSULE | Freq: Every day | ORAL | Status: DC
  Administered 2014-10-12 – 2014-10-16 (×4): 40 mg via ORAL
  Filled 2014-10-12 (×5): qty 1

## 2014-10-12 MED ORDER — SUMATRIPTAN SUCCINATE 100 MG PO TABS
100.0000 mg | ORAL_TABLET | Freq: Once | ORAL | Status: DC
  Filled 2014-10-12: qty 1

## 2014-10-12 MED ORDER — NALOXEGOL OXALATE 25 MG PO TABS
25.0000 mg | ORAL_TABLET | Freq: Every day | ORAL | Status: DC
  Administered 2014-10-12 – 2014-10-15 (×3): 25 mg via ORAL
  Filled 2014-10-12 (×5): qty 1

## 2014-10-12 MED ORDER — TRIAMCINOLONE ACETONIDE 0.1 % EX CREA
1.0000 "application " | TOPICAL_CREAM | Freq: Two times a day (BID) | CUTANEOUS | Status: DC
  Administered 2014-10-12 – 2014-10-15 (×5): 1 via TOPICAL
  Filled 2014-10-12: qty 15

## 2014-10-12 MED ORDER — BISACODYL 10 MG RE SUPP: 10.0000 mg | Freq: Every day | RECTAL | Status: DC | PRN

## 2014-10-12 MED ORDER — HYDROMORPHONE HCL 1 MG/ML IJ SOLN
1.0000 mg | INTRAMUSCULAR | Status: DC | PRN
Start: 2014-10-12 — End: 2014-10-12
  Administered 2014-10-12: 1 mg via INTRAVENOUS
  Filled 2014-10-12: qty 1

## 2014-10-12 MED ORDER — SODIUM CHLORIDE 0.9 % IV SOLN
INTRAVENOUS | Status: DC
  Administered 2014-10-12 – 2014-10-13 (×2): via INTRAVENOUS

## 2014-10-12 MED ORDER — ADULT MULTIVITAMIN W/MINERALS CH
1.0000 | ORAL_TABLET | Freq: Every day | ORAL | Status: DC
  Filled 2014-10-12: qty 1

## 2014-10-12 MED ORDER — SODIUM CHLORIDE 0.9 % IJ SOLN
3.0000 mL | Freq: Two times a day (BID) | INTRAMUSCULAR | Status: DC
  Administered 2014-10-12: 3 mL via INTRAVENOUS

## 2014-10-12 NOTE — Care Management Note (Signed)
    Page 1 of 1   10/12/2014     11:02:52 AM CARE MANAGEMENT NOTE 10/12/2014  Patient:  [name],[name]   Account Number:  [id number]  Date Initiated:  10/12/2014  Documentation initiated by:  [name] IshiharaPEELE,[name]  Subjective/Objective Assessment:   58 yo female admitted with hip pain, hx of septic joint and multiple revisions, on home IV abx, PICC in place.     Action/Plan:   Home vs SNF   Anticipated DC Date:  10/17/2014   Anticipated DC Plan:  HOME W HOME HEALTH SERVICES  In-house referral  Clinical Social Worker      DC Associate Professorlanning Services  CM consult      [facility]   Choice offered to / List presented to:  [name]           [facility]   Status of service:  In process, will continue to follow Medicare Important Message given?   (If response is "NO", the following Medicare IM given date fields will be blank) Date Medicare IM given:   Medicare IM given by:   Date Additional Medicare IM given:   Additional Medicare IM given by:    Discharge Disposition:    Per UR Regulation:  Reviewed for med. necessity/level of care/duration of stay  If discussed at Long Length of Stay Meetings, dates discussed:    Comments:  10-12-14 [name] IshiharaSuzanne Dat Derksen RN CM 1030 Spoke with patient at bedside. States she wants to go to SNF but will not go back to [facility]. She would prefer to go home if that is her only option. [facility] and [facility]. Has all equipement needed at home. [facility] and they are aware she is in the hospital. Will continue to follow for d/c needs, awaiting surgery.

## 2014-10-12 NOTE — Consult Note (Signed)
Reason for Consult: Left hip pain Referring Physician: EDP  Suriyah Vergara is an 58 y.o. female.   HPI:  Complex  history s/p THR and removal for infection and placement of a spacer..Seen in [place] today for an xray Ordered by her pain medicine MD yesterday and sent her for treatment with reported dislocation.   She reports increased pain for at least 2 weeks with no new injury or traumatic event, no change in her activity. She also reports her Opana had recently been increased to 35m TID instead of BID.  Past Medical History  Diagnosis Date  . Tenosynovitis of fingers     RIGHT RING AND SMALL-HAD SURGERY 07/12/14 [facility]  . Bronchitis     dx  07-04-2014 (approx) will finished antibiotic 07-14-2014  . History of concussion     x4  --  no residual  . Migraines   . Depression   . 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 GERD (gastroesophageal reflux disease)   . H/O hiatal hernia   . Chronic constipation   . Chronic narcotic use   . Nocturia   . SUI (stress urinary incontinence, female)   . OA (osteoarthritis)     hips, knees, hips  . Infection of left prosthetic hip joint     left total arthroplasty done 2012/  jan 2015 removal of prosthesis  . Full dentures   . History of cellulitis     lower extremities  . Thinning of skin     easily tears  . Venous stasis dermatitis     BILATERAL LEGS  . Productive cough     RESOLVED - QUIT SMOKING  . Memory problem   . OSA on CPAP     study x3   last one few yrs ago  cpap recommended -- DOES NOT USE OR HAVE CPAP    Past Surgical History  Procedure Laterality Date  . Appendectomy  130(age 58  . 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 Orif left foot fx  1990's  . Orif right tibial fx  2008    hardware removed same year  . Open repair and  fixation right tibia and knee  X2  2009  . Total knee arthroplasty Right X2  2009  &  2010  . Total knee arthroplasty Left 2009  . Knee arthroscopy Left 2009  . Orif pelvic fx's  11 /2011  . Total hip arthroplasty Left 2012  . Revision total hip arthroplasty Left 01/ 2015  . Removal prothesis  left hip arthroplasty  02/ 2015    INFECTION  . Laparoscopic cholecystectomy  08-12-2003  . Tenosynovectomy Right 07/12/2014    Procedure: RIGHT RING/SMALL FINGER TENOSYNOVECTOMY;  Surgeon: FLinna Hoff MD;  [facility]  Service: Orthopedics;  Laterality: Right;  . Total hip revision Left 08/24/2014    Procedure: INCISION AND DRAINAGE LEFT HIP, EXCHANGE ANTIBIOTIC SPACER, OPEN REDUCTION LEFT FEMUR FRACTURE ;  Surgeon: FGearlean Alf MD;  Location: [facility];  Service: Orthopedics;  Laterality: Left;    No family history on file.  Social History:  reports that she quit smoking about 3 months ago. Her smoking use included Cigarettes. She has a 30 pack-year smoking history. She has never  used smokeless tobacco. She reports that she does not drink alcohol or use illicit drugs.  Allergies:  Allergies  Allergen Reactions  . Vancomycin Other (See Comments)    LEUKOPENIA    Medications: I have reviewed the patient's current medications.  Results for orders placed or performed during the hospital encounter of 10/11/14 (from the past 48 hour(s))  CBC     Status: Abnormal   Collection Time: 10/11/14 10:54 PM  Result Value Ref Range   WBC 2.1 (L) 4.0 - 10.5 K/uL   RBC 3.59 (L) 3.87 - 5.11 MIL/uL   Hemoglobin 10.7 (L) 12.0 - 15.0 g/dL   HCT 32.5 (L) 36.0 - 46.0 %   MCV 90.5 78.0 - 100.0 fL   MCH 29.8 26.0 - 34.0 pg   MCHC 32.9 30.0 - 36.0 g/dL   RDW 13.3 11.5 - 15.5 %   Platelets 97 (L) 150 - 400 K/uL    Comment: SPECIMEN CHECKED FOR CLOTS REPEATED TO VERIFY PLATELET COUNT CONFIRMED BY SMEAR   Basic metabolic panel     Status: Abnormal   Collection Time: 10/11/14  10:54 PM  Result Value Ref Range   Sodium 137 137 - 147 mEq/L   Potassium 4.5 3.7 - 5.3 mEq/L   Chloride 100 96 - 112 mEq/L   CO2 26 19 - 32 mEq/L   Glucose, Bld 100 (H) 70 - 99 mg/dL   BUN 11 6 - 23 mg/dL   Creatinine, Ser 0.65 0.50 - 1.10 mg/dL   Calcium 9.4 8.4 - 10.5 mg/dL   GFR calc non Af Amer >90 >90 mL/min   GFR calc Af Amer >90 >90 mL/min    Comment: (NOTE) The eGFR has been calculated using the CKD EPI equation. This calculation has not been validated in all clinical situations. eGFR's persistently <90 mL/min signify possible Chronic Kidney Disease.    Anion gap 11 5 - 15    Dg Hip Complete Left  10/12/2014   CLINICAL DATA:  Left hip pain.  EXAM: LEFT HIP - COMPLETE 2+ VIEW  COMPARISON:  08/29/2014  FINDINGS: There is a left hip prosthesis with antibiotic spacer. The prosthesis is dislocated superiorly.  Remote fracture of the left pelvis traversing the roof of the acetabulum, which is distorted.  Remote periprosthetic fracture through the left femoral diaphysis, with plate and cerclage wire fixation. The fracture is still visible, but without interval displacement.  Negative right hip.  IMPRESSION: 1.  Dislocated prosthetic left hip, including antibiotic spacer. 2. Unchanged appearance of treated periprosthetic fracture in the left femoral diaphysis. 3. Unchanged appearance of remote left acetabular fracture.   Electronically Signed   By: Jorje Guild M.D.   On: 10/12/2014 00:33    Review of Systems  Musculoskeletal: Positive for joint pain.  All other systems reviewed and are negative.  Blood pressure 136/54, pulse 76, temperature 98.3 F (36.8 C), temperature source Oral, resp. rate 20, SpO2 99 %. Physical Exam  Constitutional: She is oriented to person, place, and time. She appears well-developed.  HENT:  Head: Normocephalic.  Eyes: Pupils are equal, round, and reactive to light.  Neck: Normal range of motion.  Cardiovascular: Normal rate.   Respiratory: Effort  normal.  GI: Soft.  Musculoskeletal: She exhibits tenderness.  Left leg shortened. Neurovascularly intact. Pain left hip with motion attempt. No DVT.   Neurological: She is alert and oriented to person, place, and time.  Skin: Skin is warm and dry.  Psychiatric: She has a normal mood and affect.  Xray shows superior dislocation of prosthesis and new acetabular fx  Assessment/Plan: Reported dislocation of left hip arthroplasty antibiotic spacer construct subacute, pt denies new injury although would not expect spontaneous dislocation of the entire abx spacer without some trauma Disscussed with ER PA obtaining xrays for evaluation and admission by Hospital MD and to discuss further when xrays Available. Was not contacted back when xray done. Called ER and PA had left and patient had been sent to the floor. Paged Dr. Blaine Hamper and discussed. Patient is stable. No drainage and no trauma occurred. Hip probably dislocated up to 1-2 weeks ago Based on pain level. Will keep NPO and will need to discuss with Dr. Maureen Ralphs probable open reduction and I&D before proceeding Given the complexity of her construct.  Spoke with patient at 0315 and discussed her situation and that I would discuss with Dr. Maureen Ralphs in AM.. She confirmed this is not acute.  Pt seen this AM, discussed need for reduction in OR, Dr. Wynelle Link not available today, will need to discuss with Dr. Alvan Dame. Will keep NPO for OR today. Will hold Mobic, ASA. Discussed with Arlee Muslim PA-C for Dr. Wynelle Link who also saw pt this AM and discussed the situation with her.  Lacie Draft PA-C for Johnn Hai 10/12/2014, 2:53 AM  [phone]   Spoke with Dr. Tonita Cong - Dr. Alvan Dame plans OR possibly tomorrow. Will resume regular diet today and keep NPO after MN for OR tomorrow. Will give one dose of Lovenox today 35m for DVT ppx then hold for surgery tomorrow. Dr. BTonita Congcalled pt to discuss plan with her.

## 2014-10-12 NOTE — Progress Notes (Signed)
CSW is familiar with pt from previous admission. Met with pt to offer assistance with d/c planning. Pt had rehab at [facility] follow hospital d/c last October. Pt reports she had a terrible experience at that SNF. Pt reports that she will return home at d/c. " I have good family support and home care services in place. I have received better care at home than while I was in rehab. " RNCM will assist with d/c planning. Please consult CSW if plan changes and SNF placement is requested.  Jamie Haidinger LCSW [phone] 

## 2014-10-12 NOTE — H&P (Addendum)
[facility] History and Physical  [name] JustDeborah Kochanski ZOX:[id number] DOB: 11/17/1955 DOA: 10/11/2014  Referring physician: ED physician PCP: Carolynn ServeHINSON,JOHN, MD  Specialists:   Chief Complaint: Worsening Left hip pain  HPI: [name] is a 58 y.o. female with past medical history for GERD, migraine headache depression, left hip joint infection, venous stasis dermatitis, who presents with worsening left hip pain.  Patient reports that she had left hip arthroplasty in Feb 2012 and  In Jan 2015 she had the arthoplasty replaced. She then developed an infection in the left hip which required surgical debridement in Feb 2015. After that, she received several months of IV abx with transition to Oral abx.  She then transferred care to Dr. Despina HickAlusio on Oct 31st.  At that time her left hip continued to be infected. She had abx spacers in place. She is currently receiving daptomycin and rifampin. In the past 2 weeks, her left hip pain has been progressively getting worse. She can not bear weight on left leg. She did not have new injury. [facility] and had an x-ray, which showed left hip joint dislocation. She comes to the emergency room for further evaluation and treatment.  Patient denies fever, chills, headache, neck pain, chest pain, shortness of breath, abdominal pain, nausea, vomiting, diarrhea, weakness, dizziness, syncope.  Work up in the ED demonstrates dislocation of left hip joint by x-ray. Neutropenia with WBC 2.1. Patient is admitted to inpatient for further evaluation and treatment. Orthopedic surgeon was consulted.  Review of Systems: As presented in the history of presenting illness, rest negative.  Where does patient live?  At home Can patient participate in ADLs? Yes  Allergy:  Allergies  Allergen Reactions  . Vancomycin Other (See Comments)    LEUKOPENIA    Past Medical History  Diagnosis Date  . Tenosynovitis of fingers      RIGHT RING AND SMALL-HAD SURGERY 07/12/14 [facility]  . Bronchitis     dx  07-04-2014 (approx) will finished antibiotic 07-14-2014  . History of concussion     x4  --  no residual  . Migraines   . Depression   . 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 GERD (gastroesophageal reflux disease)   . H/O hiatal hernia   . Chronic constipation   . Chronic narcotic use   . Nocturia   . SUI (stress urinary incontinence, female)   . OA (osteoarthritis)     hips, knees, hips  . Infection of left prosthetic hip joint     left total arthroplasty done 2012/  jan 2015 removal of prosthesis  . Full dentures   . History of cellulitis     lower extremities  . Thinning of skin     easily tears  . Venous stasis dermatitis     BILATERAL LEGS  . Productive cough     RESOLVED - QUIT SMOKING  . Memory problem   . OSA on CPAP     study x3   last one few yrs ago  cpap recommended -- DOES NOT USE OR HAVE CPAP    Past Surgical History  Procedure Laterality Date  . Appendectomy  [id number] (age 58)  . 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  Orif left foot fx  1990's  . Orif right tibial fx  2008    hardware removed same year  . Open repair and fixation right tibia and knee  X2  2009  . Total knee arthroplasty Right X2  2009  &  2010  . Total knee arthroplasty Left 2009  . Knee arthroscopy Left 2009  . Orif pelvic fx's  11 /2011  . Total hip arthroplasty Left 2012  . Revision total hip arthroplasty Left 01/ 2015  . Removal prothesis  left hip arthroplasty  02/ 2015    INFECTION  . Laparoscopic cholecystectomy  08-12-2003  . Tenosynovectomy Right 07/12/2014    Procedure: RIGHT RING/SMALL FINGER TENOSYNOVECTOMY;  Surgeon: Sharma Covert, MD;  Location: [facility];  Service: Orthopedics;  Laterality: Right;  .  Total hip revision Left 08/24/2014    Procedure: INCISION AND DRAINAGE LEFT HIP, EXCHANGE ANTIBIOTIC SPACER, OPEN REDUCTION LEFT FEMUR FRACTURE ;  Surgeon: Loanne Drilling, MD;  Location: [facility];  Service: Orthopedics;  Laterality: Left;    Social History:  reports that she quit smoking about 3 months ago. Her smoking use included Cigarettes. She has a 30 pack-year smoking history. She has never used smokeless tobacco. She reports that she does not drink alcohol or use illicit drugs.  Family History:  Family History  Problem Relation Age of Onset  . COPD Mother   . Hypertension Father   . Pancreatic cancer Father   . COPD Brother      Prior to Admission medications   Medication Sig Start Date End Date Taking? Authorizing Provider  aspirin 325 MG tablet Take 325 mg by mouth daily.    Yes Historical Provider, MD  clotrimazole-betamethasone (LOTRISONE) cream Apply 1 application topically 2 (two) times daily as needed (rash in skin folds).   Yes Historical Provider, MD  DAPTOmycin 650 mg in sodium chloride 0.9 % 100 mL Inject 650 mg into the vein daily. Daptomycin daily for a total of six weeks (38 more doses) via PICC Line. 08/29/14  Yes Avel Peace, PA-C  DULoxetine (CYMBALTA) 60 MG capsule Take 60 mg by mouth daily.  09/24/14 09/24/15 Yes Historical Provider, MD  esomeprazole (NEXIUM) 40 MG capsule Take 40 mg by mouth. 09/24/14 09/24/15 Yes Historical Provider, MD  furosemide (LASIX) 40 MG tablet Take 40 mg by mouth 2 (two) times daily as needed (fluid retention).   Yes Historical Provider, MD  gabapentin (NEURONTIN) 400 MG capsule Take 400-800 mg by mouth 4 (four) times daily. Take 1 Tablet between 6:00-7:30 am, Take 1 Tablet at 11 am, Take 2 Tablets at 4 pm & Take 2 Tablets after 8:30 pm   Yes Historical Provider, MD  hydrOXYzine (ATARAX/VISTARIL) 25 MG tablet Take 25 mg by mouth every 6 (six) hours as needed for anxiety (anxiety).    Yes Historical Provider, MD  IRON PO Take 1 tablet by  mouth every morning.   Yes Historical Provider, MD  meloxicam (MOBIC) 7.5 MG tablet Take 7.5 mg by mouth daily.    Yes Historical Provider, MD  Multiple Vitamin (MULTIVITAMIN) tablet Take 1 tablet by mouth at bedtime.    Yes Historical Provider, MD  Naloxegol Oxalate (MOVANTIK PO) Take 25 mg by mouth at bedtime.    Yes Historical Provider, MD  nicotine polacrilex (COMMIT) 4 MG lozenge Take 4 mg by mouth as needed for smoking cessation (smoking cessation).    Yes Historical Provider, MD  ondansetron (ZOFRAN) 4 MG tablet Take 1 tablet (4  mg total) by mouth every 6 (six) hours as needed for nausea. 08/29/14  Yes Avel Peace, PA-C  oxyCODONE 10 MG TABS Take 1-2 tablets (10-20 mg total) by mouth every 3 (three) hours as needed for moderate pain, severe pain or breakthrough pain. 08/29/14  Yes Avel Peace, PA-C  oxymorphone (OPANA ER) 20 MG 12 hr tablet Take 1 tablet (20 mg total) by mouth every 12 (twelve) hours. Patient taking differently: Take 20 mg by mouth 3 (three) times daily.  08/29/14  Yes Avel Peace, PA-C  rifampin (RIFADIN) 300 MG capsule Take 1 capsule (300 mg total) by mouth 2 (two) times daily. DO NOT START UNTIL Tuesday  09/18/2014 WHEN THE PATIENT COMES OFF THE XARELTO. 10/11/14  Yes Cliffton Asters, MD  solifenacin (VESICARE) 10 MG tablet Take 10 mg by mouth at bedtime.  09/24/14 09/24/15 Yes Historical Provider, MD  SUMAtriptan (IMITREX) 100 MG tablet Take 100 mg by mouth once. May repeat in 2 hours if headache persists or recurs. NO MORE THAN 2 IN A 24 HOUR PERIOD   Yes Historical Provider, MD  traZODone (DESYREL) 150 MG tablet Take 150-300 mg by mouth at bedtime.    Yes Historical Provider, MD  triamcinolone cream (KENALOG) 0.1 % Apply 1 application topically 2 (two) times daily. CALF OF BOTH LEGS - BECAUSE OF SKIN IRRITATION THOUGHT TO BE DUE TO SWELLING OF LEGS   Yes Historical Provider, MD  acetaminophen (TYLENOL) 325 MG tablet Take 2 tablets (650 mg total) by mouth every 6 (six) hours  as needed for mild pain (or Fever >/= 101). 08/29/14   Avel Peace, PA-C  bisacodyl (DULCOLAX) 10 MG suppository Place 1 suppository (10 mg total) rectally daily as needed for moderate constipation. 08/29/14   Avel Peace, PA-C  docusate sodium (COLACE) 100 MG capsule Take 100-200 mg by mouth at bedtime as needed for mild constipation.    Historical Provider, MD  methocarbamol (ROBAXIN) 500 MG tablet Take 1 tablet (500 mg total) by mouth every 6 (six) hours as needed for muscle spasms. 08/29/14   Avel Peace, PA-C  metoCLOPramide (REGLAN) 5 MG tablet Take 1-2 tablets (5-10 mg total) by mouth every 8 (eight) hours as needed for nausea (if ondansetron (ZOFRAN) ineffective.). 08/29/14   Avel Peace, PA-C  sodium chloride (OCEAN) 0.65 % SOLN nasal spray Place 2 sprays into both nostrils daily as needed for congestion.    Historical Provider, MD    Physical Exam: Filed Vitals:   10/11/14 2112 10/12/14 0039 10/12/14 0210 10/12/14 0440  BP: 143/56 123/49 136/54 115/48  Pulse: 91 79 76 85  Temp: 98 F (36.7 C) 98 F (36.7 C) 98.3 F (36.8 C) 98 F (36.7 C)  TempSrc: Oral Oral Oral Oral  Resp: 16 18 20 16   SpO2: 97% 97% 99% 92%   General: Not in acute distress HEENT:       Eyes: PERRL, EOMI, no scleral icterus       ENT: No discharge from the ears and nose, no pharynx injection, no tonsillar enlargement.        Neck: No JVD, no bruit, no mass felt. Cardiac: S1/S2, RRR, 1/6 systolic murmurs, No gallops or rubs Pulm: Good air movement bilaterally. Clear to auscultation bilaterally. No rales, wheezing, rhonchi or rubs. Abd: Soft, nondistended, nontender, no rebound pain, no organomegaly, BS present Ext: Bilateral venous insufficient changes. 2+DP/PT pulse bilaterally Musculoskeletal: Tender over left hip joint, slightly shorter left leg.  Skin: No rashes.  Neuro: Alert and oriented X3, cranial nerves  II-XII grossly intact, muscle strength 5/5 in all extremeties, sensation to light touch intact.   Psych: Patient is not psychotic, no suicidal or hemocidal ideation.  Labs on Admission:  Basic Metabolic Panel:  Recent Labs Lab 10/11/14 2254 10/12/14 0430  NA 137 137  K 4.5 4.3  CL 100 99  CO2 26 26  GLUCOSE 100* 99  BUN 11 10  CREATININE 0.65 0.65  CALCIUM 9.4 9.5   Liver Function Tests:  Recent Labs Lab 10/12/14 0430  AST 25  ALT 11  ALKPHOS 104  BILITOT 0.3  PROT 7.3  ALBUMIN 3.4*   No results for input(s): LIPASE, AMYLASE in the last 168 hours. No results for input(s): AMMONIA in the last 168 hours. CBC:  Recent Labs Lab 10/11/14 2254 10/12/14 0430  WBC 2.1* 2.3*  HGB 10.7* 10.7*  HCT 32.5* 32.1*  MCV 90.5 89.7  PLT 97* 90*   Cardiac Enzymes: No results for input(s): CKTOTAL, CKMB, CKMBINDEX, TROPONINI in the last 168 hours.  BNP (last 3 results) No results for input(s): PROBNP in the last 8760 hours. CBG: No results for input(s): GLUCAP in the last 168 hours.  Radiological Exams on Admission: Dg Hip Complete Left  10/12/2014   CLINICAL DATA:  Left hip pain.  EXAM: LEFT HIP - COMPLETE 2+ VIEW  COMPARISON:  08/29/2014  FINDINGS: There is a left hip prosthesis with antibiotic spacer. The prosthesis is dislocated superiorly.  Remote fracture of the left pelvis traversing the roof of the acetabulum, which is distorted.  Remote periprosthetic fracture through the left femoral diaphysis, with plate and cerclage wire fixation. The fracture is still visible, but without interval displacement.  Negative right hip.  IMPRESSION: 1.  Dislocated prosthetic left hip, including antibiotic spacer. 2. Unchanged appearance of treated periprosthetic fracture in the left femoral diaphysis. 3. Unchanged appearance of remote left acetabular fracture.   Electronically Signed   By: Tiburcio PeaJonathan  Watts M.D.   On: 10/12/2014 00:33    EKG: Independently reviewed.   Assessment/Plan Principal Problem:   Hip dislocation, left Active Problems:   Septic arthritis of hip    Depression   OA (osteoarthritis)   Left hip pain   Gastroesophageal reflux disease with esophagitis   Pancytopenia  Septic arthritis of left hip and dislocation of left hip joint: She has complicated history of left hip surgery and infection. Now she has hip joint dislocation. Patient has significant tenderness, but not septic. No neurovascular compromise.  -Admitted to MedSurg bed -Follow-up orthopedic recommendation -Nothing by mouth -INR -Continue daptomycin and rifampin - Pain control: Tylenol, mobic, oxymorphone, oxycodone -Continue Robaxin  Pancytopenia: Likely due to chronic infection. Hemoglobin stable. Patient is on iron supplement. -Follow-up CBC  GERD: -Protonix  Depression: Stable. No suicidal or homicidal ideations. -Continue Cymbalta and trazodone   DVT ppx: SCD (thrombocytopenia) Code Status: Full code Family Communication: None at bed side.     Disposition Plan: Admit to inpatient   Date of Service 10/12/2014    Lorretta HarpIU, Shayle Donahoo [facility] Pager [phone]  If 7PM-7AM, please contact night-coverage [email or web address] Password [email or web address] 10/12/2014, 6:19 AM

## 2014-10-12 NOTE — Progress Notes (Signed)
OT Cancellation Note  Patient Details Name: Reine JustDeborah Montemayor MRN: [id number] DOB: 08/16/1956   Cancelled Treatment:    Reason Eval/Treat Not Completed: Other (comment).  Noted plan is surgery, possibly tomorrow.  Please reorder OT after this.  Thank you.  Harvin Konicek 10/12/2014, 12:08 PM  Marica OtterMaryellen Braylynn Lewing, OTR/L [phone] 10/12/2014

## 2014-10-12 NOTE — Progress Notes (Signed)
Dr. Roswell MinersAllusion unavailable. Discussed with Dr. Charlann Boxerlin who will plan operative intervention possibly tomorrow. Discussed with patient. NPO after MN tonight.

## 2014-10-12 NOTE — Progress Notes (Signed)
PT Cancellation Note  Patient Details Name: [name] MRN: [id number] DOB: [birth year]   Cancelled Treatment:     PT deferred.  Pt awaiting orthopedic intervention.  Please reorder following.   Rotha Cassels 10/12/2014, 12:10 PM

## 2014-10-12 NOTE — Progress Notes (Addendum)
   Follow Up Note  Pt admitted earlier this morning.  Seen after arrived to floor.    Doing better, pain better controlled  Exam: CV: Regular rate and rhythm, S1-S2 Lungs: Clear to auscultation bilaterally Abd: Soft, nontender, nondistended, positive bowel sounds Ext: No clubbing or cyanosis or edema  Present on Admission:  . hip pain from Hip dislocation, left w/ septic arthritis of the hip: On daptomycin and rifampin, for OR tomorrow, continue medication for pain control.   . OA (osteoarthritis) . Depression: Stable   History of obstructive sleep apnea: Continue nightly C Pap  . Gastroesophageal reflux disease with esophagitis: On PPI  . Pancytopenia: Overall lab stable and in review of previous labs on previous admissions, this is likely from chronic infection.

## 2014-10-13 ENCOUNTER — Encounter (HOSPITAL_COMMUNITY)
Admission: EM | Disposition: A | Discharge status: Skilled Nursing Facility | Payer: Self-pay | Source: Home / Self Care | Attending: Internal Medicine

## 2014-10-13 DIAGNOSIS — M1990 Unspecified osteoarthritis, unspecified site: Secondary | ICD-10-CM

## 2014-10-13 LAB — GLUCOSE, CAPILLARY: Glucose-Capillary: 136 mg/dL — ABNORMAL HIGH (ref 70–99)

## 2014-10-13 SURGERY — TOTAL HIP REVISION
Anesthesia: General | Laterality: Left

## 2014-10-13 MED ORDER — GABAPENTIN 400 MG PO CAPS
800.0000 mg | ORAL_CAPSULE | ORAL | Status: DC
  Administered 2014-10-13 – 2014-10-16 (×5): 800 mg via ORAL
  Filled 2014-10-13 (×8): qty 2

## 2014-10-13 MED ORDER — GABAPENTIN 400 MG PO CAPS
400.0000 mg | ORAL_CAPSULE | ORAL | Status: DC
  Administered 2014-10-13 – 2014-10-16 (×7): 400 mg via ORAL
  Filled 2014-10-13 (×10): qty 1

## 2014-10-13 MED ORDER — ALTEPLASE 2 MG IJ SOLR
2.0000 mg | Freq: Once | INTRAMUSCULAR | Status: AC
  Administered 2014-10-13: 2 mg
  Filled 2014-10-13: qty 2

## 2014-10-13 NOTE — Progress Notes (Signed)
[facility]                                                                              Patient Demographics  [name] JustDeborah Teel, is a 58 y.o. female, DOB - 1956-05-02, ZOX:[id number]  Admit date - 10/11/2014   Admitting Physician Lorretta HarpXilin Niu, MD  Outpatient Primary MD for the patient is Carolynn ServeHINSON,JOHN, MD  LOS - 2   Chief Complaint  Patient presents with  . Hip Pain    left      HPI on 10/12/2014 by Dr. Lorretta HarpXilin Niu  [name] is a 58 y.o. female with past medical history for GERD, migraine headache depression, left hip joint infection, venous stasis dermatitis, who presents with worsening left hip pain.  Patient reports that she had left hip arthroplasty in Feb 2012 and In Jan 2015 she had the arthoplasty replaced. She then developed an infection in the left hip which required surgical debridement in Feb 2015. After that, she received several months of IV abx with transition to Oral abx. She then transferred care to Dr. Despina HickAlusio on Oct 31st. At that time her left hip continued to be infected. She had abx spacers in place. She is currently receiving daptomycin and rifampin. In the past 2 weeks, her left hip pain has been progressively getting worse. She can not bear weight on left leg. She did not have new injury. [facility] and had an x-ray, which showed left hip joint dislocation. She comes to the emergency room for further evaluation and treatment. Patient denies fever, chills, headache, neck pain, chest pain, shortness of breath, abdominal pain, nausea, vomiting, diarrhea, weakness, dizziness, syncope. Work up in the ED demonstrates dislocation of left hip joint by x-ray. Neutropenia with WBC 2.1. Patient is admitted to inpatient for further evaluation and treatment. Orthopedic surgeon was consulted.  Assessment & Plan   Left hip pain/hip dislocation/left septic arthritis of the hip -Continue daptomycin and  rifampin -Orthopedics consulted and appreciated -Tentative surgery for 10/14/2014 (per patient) -Nothing by mouth after midnight -Continue pain control as needed -Will need to follow-up with infectious disease, Dr. Orvan Falconerampbell, at discharge -PT/OT consulted  Osteoarthritis -Patient will likely need bone density scan upon discharge -Will check vitamin D level  Depression -Continue Cymbalta  Obstructive sleep apnea -Continue C Pap daily at bedtime  GERD -Continue PPI  Pancytopenia -Likely secondary to chronic infection, labs appear stable compared to previous admissions  Code Status: Full  Family Communication: None at bedside  Disposition Plan: Admitted  Time Spent in minutes   30 minutes  Procedures  None  Consults   Orthopedic Surgery  DVT Prophylaxis  SCDs  Lab Results  Component Value Date   PLT 90* 10/12/2014    Medications  Scheduled Meds: . clotrimazole   Topical BID  . DAPTOmycin (CUBICIN)  IV  650 mg Intravenous Q24H  . darifenacin  15 mg Oral QHS  . DULoxetine  60 mg Oral Daily  . esomeprazole  40 mg Oral Daily  . ferrous sulfate  325 mg Oral BH-q7a  . gabapentin  400 mg Oral 2 times per day  . gabapentin  800 mg Oral 2 times  per day  . Naloxegol Oxalate  25 mg Oral QHS  . oxymorphone  20 mg Oral 3 times per day  . rifampin  300 mg Oral BID  . saccharomyces boulardii  250 mg Oral BID  . sodium chloride  3 mL Intravenous Q12H  . sodium chloride  3 mL Intravenous Q12H  . SUMAtriptan  100 mg Oral Once  . traZODone  150-300 mg Oral QHS  . triamcinolone cream  1 application Topical BID   Continuous Infusions: . sodium chloride 30 mL/hr at 10/12/14 1102   PRN Meds:.sodium chloride, acetaminophen, bisacodyl, docusate sodium, HYDROmorphone (DILAUDID) injection, hydrOXYzine, methocarbamol, metoCLOPramide, nicotine polacrilex, ondansetron, oxyCODONE, sodium chloride, sodium chloride, sodium chloride  Antibiotics    Anti-infectives    Start      Dose/Rate Route Frequency Ordered Stop   10/12/14 0815  DAPTOmycin (CUBICIN) 650 mg in sodium chloride 0.9 % IVPB     650 mg226 mL/hr over 30 Minutes Intravenous Every 24 hours 10/12/14 0259     10/12/14 0300  rifampin (RIFADIN) capsule 300 mg     300 mg Oral 2 times daily 10/12/14 0259        Subjective:   [name] Andaya seen and examined today.  Patient complains of pain in her hands as it is been difficult to get out of the bed due to its soft nature. She denies any chest pain or shortness of breath at this time. She does have some soreness in the left hip. Patient inquires about getting a bone density scan as well as vitamin D checked.   Objective:   Filed Vitals:   10/12/14 1241 10/12/14 1451 10/12/14 2055 10/13/14 0510  BP: 115/48 133/70 121/59 117/66  Pulse: 85 87 75 79  Temp: 98 F (36.7 C) 98.2 F (36.8 C) 98.2 F (36.8 C) 98.6 F (37 C)  TempSrc: Oral Oral Oral Oral  Resp: 16 16 16 20   Height:      Weight:      SpO2: 92% 96% 96% 97%    Wt Readings from Last 3 Encounters:  10/12/14 109.317 kg (241 lb)  09/18/14 109.997 kg (242 lb 8 oz)  09/11/14 117.935 kg (260 lb)     Intake/Output Summary (Last 24 hours) at 10/13/14 1217 Last data filed at 10/13/14 1157  Gross per 24 hour  Intake   1429 ml  Output   2475 ml  Net  -1046 ml    Exam  General: Well developed, well nourished, NAD, appears stated age  HEENT: NCAT, mucous membranes moist.   Cardiovascular: S1 S2 auscultated, no rubs, murmurs or gallops. Regular rate and rhythm.  Respiratory: Clear to auscultation bilaterally with equal chest rise  Abdomen: Soft, nontender, nondistended, + bowel sounds  Extremities: warm dry without cyanosis clubbing or edema  Neuro: AAOx3, no focal deficits, able to move extremities, however limited in the left lower extremity.  Skin: Without rashes exudates or nodules  Psych: Appropriate  Data Review   Micro Results No results found for this or any previous  visit (from the past 240 hour(s)).  Radiology Reports Dg Hip Complete Left  10/12/2014   CLINICAL DATA:  Left hip pain.  EXAM: LEFT HIP - COMPLETE 2+ VIEW  COMPARISON:  08/29/2014  FINDINGS: There is a left hip prosthesis with antibiotic spacer. The prosthesis is dislocated superiorly.  Remote fracture of the left pelvis traversing the roof of the acetabulum, which is distorted.  Remote periprosthetic fracture through the left femoral diaphysis, with plate and cerclage  wire fixation. The fracture is still visible, but without interval displacement.  Negative right hip.  IMPRESSION: 1.  Dislocated prosthetic left hip, including antibiotic spacer. 2. Unchanged appearance of treated periprosthetic fracture in the left femoral diaphysis. 3. Unchanged appearance of remote left acetabular fracture.   Electronically Signed   By: Tiburcio Pea M.D.   On: 10/12/2014 00:33    CBC  Recent Labs Lab 10/11/14 2254 10/12/14 0430  WBC 2.1* 2.3*  HGB 10.7* 10.7*  HCT 32.5* 32.1*  PLT 97* 90*  MCV 90.5 89.7  MCH 29.8 29.9  MCHC 32.9 33.3  RDW 13.3 13.2    Chemistries   Recent Labs Lab 10/11/14 2254 10/12/14 0430  NA 137 137  K 4.5 4.3  CL 100 99  CO2 26 26  GLUCOSE 100* 99  BUN 11 10  CREATININE 0.65 0.65  CALCIUM 9.4 9.5  AST  --  25  ALT  --  11  ALKPHOS  --  104  BILITOT  --  0.3   ------------------------------------------------------------------------------------------------------------------ estimated creatinine clearance is 103.9 mL/min (by C-G formula based on Cr of 0.65). ------------------------------------------------------------------------------------------------------------------ No results for input(s): HGBA1C in the last 72 hours. ------------------------------------------------------------------------------------------------------------------ No results for input(s): CHOL, HDL, LDLCALC, TRIG, CHOLHDL, LDLDIRECT in the last 72  hours. ------------------------------------------------------------------------------------------------------------------ No results for input(s): TSH, T4TOTAL, T3FREE, THYROIDAB in the last 72 hours.  Invalid input(s): FREET3 ------------------------------------------------------------------------------------------------------------------ No results for input(s): VITAMINB12, FOLATE, FERRITIN, TIBC, IRON, RETICCTPCT in the last 72 hours.  Coagulation profile  Recent Labs Lab 10/12/14 0430  INR 1.03    No results for input(s): DDIMER in the last 72 hours.  Cardiac Enzymes No results for input(s): CKMB, TROPONINI, MYOGLOBIN in the last 168 hours.  Invalid input(s): CK ------------------------------------------------------------------------------------------------------------------ Invalid input(s): POCBNP    Marykay Mccleod D.O. on 10/13/2014 at 12:17 PM  Between 7am to 7pm - Pager - [phone]  After 7pm go to [email or web address] - password [email or web address]  And look for the night coverage person covering for me after hours  [facility] Office  [phone]

## 2014-10-13 NOTE — Anesthesia Preprocedure Evaluation (Signed)
Anesthesia Evaluation  Patient identified by MRN, date of birth, ID band Patient awake    Reviewed: Allergy & Precautions, H&P , NPO status , Patient's Chart, lab work & pertinent test results  Airway Mallampati: III  TM Distance: >3 FB Neck ROM: Full    Dental  (+) Lower Dentures, Upper Dentures, Dental Advisory Given   Pulmonary sleep apnea , former smoker,  CXR: No acute disease. Mild cardiomegaly. breath sounds clear to auscultation  Pulmonary exam normal       Cardiovascular negative cardio ROS  Rhythm:Regular Rate:Normal     Neuro/Psych  Headaches, PSYCHIATRIC DISORDERS Depression    GI/Hepatic Neg liver ROS, hiatal hernia, GERD-  Medicated,  Endo/Other  negative endocrine ROS  Renal/GU negative Renal ROS  negative genitourinary   Musculoskeletal  (+) Arthritis -,   Abdominal (+) + obese,   Peds negative pediatric ROS (+)  Hematology negative hematology ROS (+) anemia , hgb 10.7   Anesthesia Other Findings   Reproductive/Obstetrics negative OB ROS                             Anesthesia Physical Anesthesia Plan  ASA: III  Anesthesia Plan: General   Post-op Pain Management:    Induction: Intravenous  Airway Management Planned: Oral ETT  Additional Equipment:   Intra-op Plan:   Post-operative Plan: Extubation in OR  Informed Consent:   Plan Discussed with: Surgeon  Anesthesia Plan Comments:         Anesthesia Quick Evaluation

## 2014-10-13 NOTE — Progress Notes (Signed)
Patient ID: [name] [name], female   DOB: [birth year], 57 y.o.   MRN: [id number] Very complex surgical history involving her left hip Reports 3 weeks of increasing pain  Recent X-rays reveal dislocation of cemented acetabular shell placed to try and treat infection of the left hip  At this point I will plan to take her to the operating room to attempt revision of the acetabulum depending on available bone stock given previous acetabular fracture as well as failed previous left hip surgery due to infection.  If one stock poor I will and/or femoral component loose, I will plan to repeat I&D and place cement within femur and acetabulum as girdlestone.  Reviewed plan with her NPO after midnight tonight Consent ordered

## 2014-10-14 ENCOUNTER — Encounter (HOSPITAL_COMMUNITY): Payer: Self-pay | Admitting: *Deleted

## 2014-10-14 ENCOUNTER — Inpatient Hospital Stay (HOSPITAL_COMMUNITY): Payer: Medicare Other | Admitting: Anesthesiology

## 2014-10-14 ENCOUNTER — Encounter (HOSPITAL_COMMUNITY)
Admission: EM | Disposition: A | Discharge status: Skilled Nursing Facility | Payer: Self-pay | Source: Home / Self Care | Attending: Internal Medicine

## 2014-10-14 ENCOUNTER — Inpatient Hospital Stay (HOSPITAL_COMMUNITY): Payer: Medicare Other

## 2014-10-14 HISTORY — PX: TOTAL HIP REVISION: SHX763

## 2014-10-14 LAB — PREPARE RBC (CROSSMATCH)

## 2014-10-14 LAB — BASIC METABOLIC PANEL
Anion gap: 10 (ref 5–15)
BUN: 14 mg/dL (ref 6–23)
CALCIUM: 9.3 mg/dL (ref 8.4–10.5)
CO2: 27 meq/L (ref 19–32)
CREATININE: 0.65 mg/dL (ref 0.50–1.10)
Chloride: 99 mEq/L (ref 96–112)
GFR calc Af Amer: >90 mL/min (ref >90)
GLUCOSE: 109 mg/dL — AB (ref 70–99)
Potassium: 4.4 mEq/L (ref 3.7–5.3)
Sodium: 136 mEq/L — ABNORMAL LOW (ref 137–147)

## 2014-10-14 LAB — CBC
HCT: 32.1 % — ABNORMAL LOW (ref 36.0–46.0)
Hemoglobin: 10.5 g/dL — ABNORMAL LOW (ref 12.0–15.0)
MCH: 29.9 pg (ref 26.0–34.0)
MCHC: 32.7 g/dL (ref 30.0–36.0)
MCV: 91.5 fL (ref 78.0–100.0)
Platelets: 88 10*3/uL — ABNORMAL LOW (ref 150–400)
RBC: 3.51 MIL/uL — AB (ref 3.87–5.11)
RDW: 13.3 % (ref 11.5–15.5)
WBC: 2.1 10*3/uL — ABNORMAL LOW (ref 4.0–10.5)

## 2014-10-14 LAB — SURGICAL PCR SCREEN
MRSA, PCR: INVALID — AB
Staphylococcus aureus: INVALID — AB

## 2014-10-14 SURGERY — TOTAL HIP REVISION
Anesthesia: General | Site: Hip | Laterality: Left

## 2014-10-14 MED ORDER — ACETAMINOPHEN 650 MG RE SUPP: 650.0000 mg | Freq: Four times a day (QID) | RECTAL | Status: DC | PRN

## 2014-10-14 MED ORDER — VANCOMYCIN HCL 1000 MG IV SOLR
INTRAVENOUS | Status: AC
  Filled 2014-10-14: qty 2000

## 2014-10-14 MED ORDER — TOBRAMYCIN SULFATE 1.2 G IJ SOLR
INTRAMUSCULAR | Status: AC
  Filled 2014-10-14: qty 2.4

## 2014-10-14 MED ORDER — ROCURONIUM BROMIDE 100 MG/10ML IV SOLN
INTRAVENOUS | Status: DC | PRN
  Administered 2014-10-14: 30 mg via INTRAVENOUS

## 2014-10-14 MED ORDER — ENOXAPARIN SODIUM 40 MG/0.4ML ~~LOC~~ SOLN
40.0000 mg | SUBCUTANEOUS | Status: DC
  Administered 2014-10-15 – 2014-10-16 (×2): 40 mg via SUBCUTANEOUS
  Filled 2014-10-14 (×2): qty 0.4

## 2014-10-14 MED ORDER — RIVAROXABAN 10 MG PO TABS: 10.0000 mg | ORAL_TABLET | Freq: Every day | ORAL | Status: DC

## 2014-10-14 MED ORDER — 0.9 % SODIUM CHLORIDE (POUR BTL) OPTIME
TOPICAL | Status: DC | PRN
  Administered 2014-10-14: 1000 mL

## 2014-10-14 MED ORDER — SODIUM CHLORIDE 0.9 % IR SOLN
Status: DC | PRN
  Administered 2014-10-14 (×2): 3000 mL
  Administered 2014-10-14 (×2): 1000 mL

## 2014-10-14 MED ORDER — VANCOMYCIN HCL 1000 MG IV SOLR
INTRAVENOUS | Status: DC | PRN
  Administered 2014-10-14 (×5): 1000 mg

## 2014-10-14 MED ORDER — MIDAZOLAM HCL 5 MG/5ML IJ SOLN
INTRAMUSCULAR | Status: DC | PRN
  Administered 2014-10-14: 2 mg via INTRAVENOUS

## 2014-10-14 MED ORDER — METOCLOPRAMIDE HCL 5 MG/ML IJ SOLN: 5.0000 mg | Freq: Three times a day (TID) | INTRAMUSCULAR | Status: DC | PRN

## 2014-10-14 MED ORDER — MIDAZOLAM HCL 2 MG/2ML IJ SOLN
INTRAMUSCULAR | Status: AC
  Filled 2014-10-14: qty 2

## 2014-10-14 MED ORDER — SUCCINYLCHOLINE CHLORIDE 20 MG/ML IJ SOLN
INTRAMUSCULAR | Status: DC | PRN
  Administered 2014-10-14: 100 mg via INTRAVENOUS

## 2014-10-14 MED ORDER — CEFAZOLIN SODIUM-DEXTROSE 2-3 GM-% IV SOLR
INTRAVENOUS | Status: DC | PRN
  Administered 2014-10-14: 2 g via INTRAVENOUS

## 2014-10-14 MED ORDER — LACTATED RINGERS IV SOLN: INTRAVENOUS | Status: DC

## 2014-10-14 MED ORDER — ONDANSETRON HCL 4 MG/2ML IJ SOLN
INTRAMUSCULAR | Status: DC | PRN
  Administered 2014-10-14: 4 mg via INTRAVENOUS

## 2014-10-14 MED ORDER — ACETAMINOPHEN 325 MG PO TABS
650.0000 mg | ORAL_TABLET | Freq: Four times a day (QID) | ORAL | Status: DC | PRN
  Administered 2014-10-15: 650 mg via ORAL
  Filled 2014-10-14: qty 2

## 2014-10-14 MED ORDER — SODIUM CHLORIDE 0.9 % IV SOLN: 10.0000 mL/h | Freq: Once | INTRAVENOUS | Status: DC

## 2014-10-14 MED ORDER — LACTATED RINGERS IV SOLN
INTRAVENOUS | Status: DC | PRN
  Administered 2014-10-14 (×2): via INTRAVENOUS

## 2014-10-14 MED ORDER — ALUM & MAG HYDROXIDE-SIMETH 200-200-20 MG/5ML PO SUSP: 30.0000 mL | ORAL | Status: DC | PRN

## 2014-10-14 MED ORDER — MENTHOL 3 MG MT LOZG
1.0000 | LOZENGE | OROMUCOSAL | Status: DC | PRN
  Filled 2014-10-14: qty 9

## 2014-10-14 MED ORDER — DEXAMETHASONE SODIUM PHOSPHATE 10 MG/ML IJ SOLN
INTRAMUSCULAR | Status: DC | PRN
  Administered 2014-10-14: 10 mg via INTRAVENOUS

## 2014-10-14 MED ORDER — FENTANYL CITRATE 0.05 MG/ML IJ SOLN
INTRAMUSCULAR | Status: AC
  Filled 2014-10-14: qty 5

## 2014-10-14 MED ORDER — HYDROMORPHONE HCL 2 MG/ML IJ SOLN
INTRAMUSCULAR | Status: AC
  Filled 2014-10-14: qty 1

## 2014-10-14 MED ORDER — LACTATED RINGERS IV SOLN
INTRAVENOUS | Status: DC | PRN
  Administered 2014-10-14: 10:00:00 via INTRAVENOUS

## 2014-10-14 MED ORDER — METOCLOPRAMIDE HCL 10 MG PO TABS
5.0000 mg | ORAL_TABLET | Freq: Three times a day (TID) | ORAL | Status: DC | PRN
Start: 2014-10-14 — End: 2014-10-16

## 2014-10-14 MED ORDER — METHOCARBAMOL 500 MG PO TABS
500.0000 mg | ORAL_TABLET | Freq: Four times a day (QID) | ORAL | Status: DC | PRN
Start: 2014-10-14 — End: 2014-10-16
  Administered 2014-10-15: 500 mg via ORAL

## 2014-10-14 MED ORDER — PROPOFOL 10 MG/ML IV BOLUS
INTRAVENOUS | Status: AC
  Filled 2014-10-14: qty 20

## 2014-10-14 MED ORDER — HYDROMORPHONE HCL 1 MG/ML IJ SOLN
INTRAMUSCULAR | Status: AC
  Filled 2014-10-14: qty 1

## 2014-10-14 MED ORDER — HYDROMORPHONE HCL 1 MG/ML IJ SOLN
0.2500 mg | INTRAMUSCULAR | Status: DC | PRN
  Administered 2014-10-14 (×4): 0.5 mg via INTRAVENOUS

## 2014-10-14 MED ORDER — ONDANSETRON HCL 4 MG PO TABS
4.0000 mg | ORAL_TABLET | Freq: Four times a day (QID) | ORAL | Status: DC | PRN
  Administered 2014-10-15: 4 mg via ORAL
  Filled 2014-10-14: qty 1

## 2014-10-14 MED ORDER — LIDOCAINE HCL (CARDIAC) 20 MG/ML IV SOLN
INTRAVENOUS | Status: DC | PRN
  Administered 2014-10-14: 100 mg via INTRAVENOUS

## 2014-10-14 MED ORDER — FENTANYL CITRATE 0.05 MG/ML IJ SOLN
INTRAMUSCULAR | Status: DC | PRN
  Administered 2014-10-14 (×2): 50 ug via INTRAVENOUS
  Administered 2014-10-14: 100 ug via INTRAVENOUS
  Administered 2014-10-14: 50 ug via INTRAVENOUS

## 2014-10-14 MED ORDER — PROPOFOL 10 MG/ML IV BOLUS
INTRAVENOUS | Status: DC | PRN
  Administered 2014-10-14: 150 mg via INTRAVENOUS

## 2014-10-14 MED ORDER — VANCOMYCIN HCL 1000 MG IV SOLR
INTRAVENOUS | Status: AC
  Filled 2014-10-14: qty 3000

## 2014-10-14 MED ORDER — NEOSTIGMINE METHYLSULFATE 10 MG/10ML IV SOLN
INTRAVENOUS | Status: DC | PRN
  Administered 2014-10-14: 2.5 mg via INTRAVENOUS

## 2014-10-14 MED ORDER — MIDAZOLAM HCL 2 MG/2ML IJ SOLN
1.0000 mg | INTRAMUSCULAR | Status: DC | PRN
  Administered 2014-10-14: 1 mg via INTRAVENOUS

## 2014-10-14 MED ORDER — SODIUM CHLORIDE 0.9 % IV SOLN
INTRAVENOUS | Status: DC
  Administered 2014-10-14: 15:00:00 via INTRAVENOUS
  Filled 2014-10-14 (×8): qty 1000

## 2014-10-14 MED ORDER — HYDROMORPHONE HCL 1 MG/ML IJ SOLN
INTRAMUSCULAR | Status: DC | PRN
  Administered 2014-10-14 (×2): 1 mg via INTRAVENOUS

## 2014-10-14 MED ORDER — HYDROMORPHONE HCL 1 MG/ML IJ SOLN
0.5000 mg | INTRAMUSCULAR | Status: DC | PRN
  Administered 2014-10-15: 1 mg via INTRAVENOUS
  Filled 2014-10-14: qty 1

## 2014-10-14 MED ORDER — ONDANSETRON HCL 4 MG/2ML IJ SOLN
4.0000 mg | Freq: Four times a day (QID) | INTRAMUSCULAR | Status: DC | PRN
  Administered 2014-10-15 – 2014-10-16 (×2): 4 mg via INTRAVENOUS
  Filled 2014-10-14 (×2): qty 2

## 2014-10-14 MED ORDER — DOCUSATE SODIUM 100 MG PO CAPS
100.0000 mg | ORAL_CAPSULE | Freq: Two times a day (BID) | ORAL | Status: DC
  Administered 2014-10-15 (×2): 100 mg via ORAL

## 2014-10-14 MED ORDER — GLYCOPYRROLATE 0.2 MG/ML IJ SOLN
INTRAMUSCULAR | Status: DC | PRN
  Administered 2014-10-14: 0.4 mg via INTRAVENOUS

## 2014-10-14 MED ORDER — PHENOL 1.4 % MT LIQD
1.0000 | OROMUCOSAL | Status: DC | PRN
  Filled 2014-10-14: qty 177

## 2014-10-14 MED ORDER — TOBRAMYCIN SULFATE 1.2 G IJ SOLR
INTRAMUSCULAR | Status: DC | PRN
  Administered 2014-10-14 (×4): 1.2 g

## 2014-10-14 MED ORDER — PHENYLEPHRINE HCL 10 MG/ML IJ SOLN
INTRAMUSCULAR | Status: DC | PRN
Start: 2014-10-14 — End: 2014-10-14
  Administered 2014-10-14 (×2): 80 ug via INTRAVENOUS
  Administered 2014-10-14: 120 ug via INTRAVENOUS
  Administered 2014-10-14 (×2): 80 ug via INTRAVENOUS
  Administered 2014-10-14: 120 ug via INTRAVENOUS
  Administered 2014-10-14: 80 ug via INTRAVENOUS

## 2014-10-14 MED ORDER — POLYETHYLENE GLYCOL 3350 17 G PO PACK: 17.0000 g | PACK | Freq: Two times a day (BID) | ORAL | Status: DC

## 2014-10-14 MED ORDER — CEFAZOLIN SODIUM-DEXTROSE 2-3 GM-% IV SOLR
INTRAVENOUS | Status: AC
  Filled 2014-10-14: qty 50

## 2014-10-14 MED ORDER — DEXTROSE 5 % IV SOLN
500.0000 mg | Freq: Four times a day (QID) | INTRAVENOUS | Status: DC | PRN
  Administered 2014-10-14: 500 mg via INTRAVENOUS
  Filled 2014-10-14 (×2): qty 5

## 2014-10-14 SURGICAL SUPPLY — 62 items
BAG ZIPLOCK 12X15 (MISCELLANEOUS) ×3 IMPLANT
BLADE SAW SGTL 18X1.27X75 (BLADE) ×2 IMPLANT
BLADE SAW SGTL 18X1.27X75MM (BLADE) ×1
BNDG COHESIVE 6X5 TAN STRL LF (GAUZE/BANDAGES/DRESSINGS) ×3 IMPLANT
BOWL SMART MIX CTS (DISPOSABLE) ×12 IMPLANT
BRUSH FEMORAL CANAL (MISCELLANEOUS) ×3 IMPLANT
CEMENT HV SMART SET (Cement) ×12 IMPLANT
DRAPE INCISE IOBAN 85X60 (DRAPES) ×6 IMPLANT
DRAPE ORTHO SPLIT 77X108 STRL (DRAPES) ×4
DRAPE POUCH INSTRU U-SHP 10X18 (DRAPES) ×3 IMPLANT
DRAPE SURG 17X11 SM STRL (DRAPES) ×3 IMPLANT
DRAPE SURG ORHT 6 SPLT 77X108 (DRAPES) ×2 IMPLANT
DRAPE U-SHAPE 47X51 STRL (DRAPES) ×3 IMPLANT
DRSG AQUACEL AG ADV 3.5X10 (GAUZE/BANDAGES/DRESSINGS) ×3 IMPLANT
DRSG AQUACEL AG ADV 3.5X14 (GAUZE/BANDAGES/DRESSINGS) ×3 IMPLANT
DRSG MEPILEX BORDER 4X12 (GAUZE/BANDAGES/DRESSINGS) ×3 IMPLANT
DURAPREP 26ML APPLICATOR (WOUND CARE) ×6 IMPLANT
ELECT BLADE TIP CTD 4 INCH (ELECTRODE) ×3 IMPLANT
ELECT REM PT RETURN 9FT ADLT (ELECTROSURGICAL) ×3
ELECTRODE REM PT RTRN 9FT ADLT (ELECTROSURGICAL) ×1 IMPLANT
FACESHIELD WRAPAROUND (MASK) ×12 IMPLANT
GAUZE SPONGE 2X2 8PLY STRL LF (GAUZE/BANDAGES/DRESSINGS) ×1 IMPLANT
GLOVE BIOGEL PI IND STRL 7.5 (GLOVE) ×1 IMPLANT
GLOVE BIOGEL PI IND STRL 8.5 (GLOVE) ×1 IMPLANT
GLOVE BIOGEL PI INDICATOR 7.5 (GLOVE) ×2
GLOVE BIOGEL PI INDICATOR 8.5 (GLOVE) ×2
GLOVE ECLIPSE 8.0 STRL XLNG CF (GLOVE) ×6 IMPLANT
GOWN SPEC L3 XXLG W/TWL (GOWN DISPOSABLE) ×6 IMPLANT
GOWN STRL REUS W/TWL LRG LVL3 (GOWN DISPOSABLE) ×3 IMPLANT
HANDPIECE INTERPULSE COAX TIP (DISPOSABLE) ×2
HEAD FEM STD 32X+5 STRL (Hips) IMPLANT
HEAD FEM STD 32X+9 STRL (Hips) ×3 IMPLANT
KIT BASIN OR (CUSTOM PROCEDURE TRAY) ×3 IMPLANT
LINER ACET CUP 42MMX32MM (Hips) ×3 IMPLANT
LIQUID BAND (GAUZE/BANDAGES/DRESSINGS) ×3 IMPLANT
MANIFOLD NEPTUNE II (INSTRUMENTS) ×6 IMPLANT
NDL SAFETY ECLIPSE 18X1.5 (NEEDLE) ×1 IMPLANT
NEEDLE HYPO 18GX1.5 SHARP (NEEDLE) ×2
NS IRRIG 1000ML POUR BTL (IV SOLUTION) ×6 IMPLANT
PACK TOTAL JOINT (CUSTOM PROCEDURE TRAY) ×3 IMPLANT
PADDING CAST COTTON 6X4 STRL (CAST SUPPLIES) ×3 IMPLANT
POSITIONER SURGICAL ARM (MISCELLANEOUS) ×3 IMPLANT
PRESSURIZER FEMORAL UNIV (MISCELLANEOUS) ×3 IMPLANT
SET HNDPC FAN SPRY TIP SCT (DISPOSABLE) ×1 IMPLANT
SPONGE GAUZE 2X2 STER 10/PKG (GAUZE/BANDAGES/DRESSINGS) ×2
SPONGE LAP 18X18 X RAY DECT (DISPOSABLE) ×9 IMPLANT
SPONGE LAP 4X18 X RAY DECT (DISPOSABLE) ×3 IMPLANT
STAPLER VISISTAT 35W (STAPLE) ×3 IMPLANT
STEM SUMMIT CEMENTED BASIC SZ3 (Hips) ×1 IMPLANT
SUCTION FRAZIER TIP 10 FR DISP (SUCTIONS) ×3 IMPLANT
SUMMIT CEMENT BASIC SZ3 (Hips) ×3 IMPLANT
SUT VIC AB 1 CT1 36 (SUTURE) ×9 IMPLANT
SUT VIC AB 2-0 CT1 27 (SUTURE) ×6
SUT VIC AB 2-0 CT1 TAPERPNT 27 (SUTURE) ×3 IMPLANT
SUT VLOC 180 0 24IN GS25 (SUTURE) ×6 IMPLANT
SYR CONTROL 10ML LL (SYRINGE) ×3 IMPLANT
TOWEL OR 17X26 10 PK STRL BLUE (TOWEL DISPOSABLE) ×6 IMPLANT
TOWER CARTRIDGE SMART MIX (DISPOSABLE) ×3 IMPLANT
TRAY FOLEY CATH 14FRSI W/METER (CATHETERS) ×3 IMPLANT
TUBE KAMVAC SUCTION (TUBING) IMPLANT
WATER STERILE IRR 1500ML POUR (IV SOLUTION) ×3 IMPLANT
YANKAUER SUCT BULB TIP NO VENT (SUCTIONS) ×3 IMPLANT

## 2014-10-14 NOTE — Brief Op Note (Signed)
10/11/2014 - 10/14/2014  8:08 AM  PATIENT:  [name]  58 y.o. 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  PROCEDURE:  Procedure(s): Revision left total hip replacement  SURGEON:  Surgeon(s) and Role:    * Shelda PalMatthew D Emanuela Runnion, MD - Primary  PHYSICIAN ASSISTANT: None  ANESTHESIA:   general  EBL:  Total I/O In: -  Out: 500 [Urine:500] 2500cc blood loss  BLOOD ADMINISTERED:none  DRAINS: none   LOCAL MEDICATIONS USED:  NONE  SPECIMEN:  Source of Specimen:  left hip joint fluid  DISPOSITION OF SPECIMEN:  PATHOLOGY  COUNTS:  YES  TOURNIQUET:  * No tourniquets in log *  DICTATION: .Other Dictation: Dictation Number [id number]  PLAN OF CARE: Admit to inpatient   PATIENT DISPOSITION:  PACU - hemodynamically stable.   Delay start of Pharmacological VTE agent (>24hrs) due to surgical blood loss or risk of bleeding: no

## 2014-10-14 NOTE — Transfer of Care (Signed)
Immediate Anesthesia Transfer of Care Note  Patient: [name]  Procedure(s) Performed: Procedure(s): PARTIAL HIP REVISION (Left)  Patient Location: PACU  Anesthesia Type:General  Level of Consciousness: awake and alert   Airway & Oxygen Therapy: Patient Spontanous Breathing and Patient connected to face mask oxygen  Post-op Assessment: Report given to PACU RN and Post -op Vital signs reviewed and stable  Post vital signs: Reviewed and stable  Complications: No apparent anesthesia complications

## 2014-10-14 NOTE — Progress Notes (Signed)
Pt stated that she and previous RN had discussed pain management and had decided she would "hold" po medications to decrease "tolerance" to Oxycodone, as she is currently taking 20 mg po every 3-4 hours with "little to no pain relief".  Writer shared with pt lack of evidence supporting this practice and suggested discussion with Ortho MD Charlann Boxer(Olin, MD) or Pain Management MD prior to institution of this plan. Writer shared personal experiences with patients who had remained drug free after developing tolerance to narcotics only to experience that same tolerance when use of Narcotics was resumed. Pt stated she would discuss this with Charlann Boxerlin, MD and others of her pain management team for best practice.

## 2014-10-14 NOTE — Progress Notes (Signed)
[facility]                                                                              Patient Demographics  [name], is a 58 y.o. female, DOB - 06/18/1956, UJW:[id number]  Admit date - 10/11/2014   Admitting Physician Lorretta Harp, MD  Outpatient Primary MD for the patient is Carolynn Serve, MD  LOS - 3   Chief Complaint  Patient presents with  . Hip Pain    left      HPI on 10/12/2014 by Dr. Lorretta Harp  [name] is a 58 y.o. female with past medical history for GERD, migraine headache depression, left hip joint infection, venous stasis dermatitis, who presents with worsening left hip pain.  Patient reports that she had left hip arthroplasty in Feb 2012 and In Jan 2015 she had the arthoplasty replaced. She then developed an infection in the left hip which required surgical debridement in Feb 2015. After that, she received several months of IV abx with transition to Oral abx. She then transferred care to Dr. Despina Hick on Oct 31st. At that time her left hip continued to be infected. She had abx spacers in place. She is currently receiving daptomycin and rifampin. In the past 2 weeks, her left hip pain has been progressively getting worse. She can not bear weight on left leg. She did not have new injury. She went to [facility] for antibiotic infusion and had an x-ray, which showed left hip joint dislocation. She comes to the emergency room for further evaluation and treatment. Patient denies fever, chills, headache, neck pain, chest pain, shortness of breath, abdominal pain, nausea, vomiting, diarrhea, weakness, dizziness, syncope. Work up in the ED demonstrates dislocation of left hip joint by x-ray. Neutropenia with WBC 2.1. Patient is admitted to inpatient for further evaluation and treatment. Orthopedic surgeon was consulted.  Assessment & Plan   Left hip pain/hip dislocation/left septic arthritis of the hip -Continue daptomycin and  rifampin -Orthopedics consulted and appreciated with surgery planned for today -Continue pain control as needed -Will need to follow-up with infectious disease, Dr. Orvan Falconer, at discharge -PT/OT consulted  Osteoarthritis -Patient will likely need bone density scan upon discharge -Vitamin D level pending  Depression -Continue Cymbalta  Obstructive sleep apnea -Continue CPAP daily at bedtime  GERD -Continue PPI  Pancytopenia -Likely secondary to chronic infection, labs appear stable compared to previous admissions  Neuropathy -Increased gabapentin from 2400mg  daily to 2800mg  daily.  Patient has tried lyrica in the past -Encouraged patient to seek EMG or nerve conduction studies as an outpatient  Code Status: Full  Family Communication: Sister at bedside  Disposition Plan: Admitted  Time Spent in minutes   30 minutes  Procedures  None  Consults   Orthopedic Surgery  DVT Prophylaxis  SCDs  Lab Results  Component Value Date   PLT 88* 10/14/2014    Medications  Scheduled Meds: . [MAR Hold] clotrimazole   Topical BID  . [MAR Hold] DAPTOmycin (CUBICIN)  IV  650 mg Intravenous Q24H  . [MAR Hold] darifenacin  15 mg Oral QHS  . [MAR Hold] DULoxetine  60 mg Oral Daily  . [MAR Hold] esomeprazole  40  mg Oral Daily  . [MAR Hold] ferrous sulfate  325 mg Oral BH-q7a  . [MAR Hold] gabapentin  400 mg Oral 3 times per day  . [MAR Hold] gabapentin  800 mg Oral 2 times per day  . [MAR Hold] Naloxegol Oxalate  25 mg Oral QHS  . [MAR Hold] oxymorphone  20 mg Oral 3 times per day  . [MAR Hold] rifampin  300 mg Oral BID  . [MAR Hold] saccharomyces boulardii  250 mg Oral BID  . [MAR Hold] sodium chloride  3 mL Intravenous Q12H  . [MAR Hold] sodium chloride  3 mL Intravenous Q12H  . SUMAtriptan  100 mg Oral Once  . [MAR Hold] traZODone  150-300 mg Oral QHS  . [facility] Hold] triamcinolone cream  1 application Topical BID   Continuous Infusions: . sodium chloride Stopped (10/14/14  0012)  . sodium chloride 0.9 % 1,000 mL with potassium chloride 10 mEq infusion     PRN Meds:.[MAR Hold] sodium chloride, 0.9 % irrigation (POUR BTL), [MAR Hold] acetaminophen, [MAR Hold] bisacodyl, [MAR Hold] docusate sodium, [MAR Hold]  HYDROmorphone (DILAUDID) injection, [MAR Hold] hydrOXYzine, methocarbamol **OR** methocarbamol (ROBAXIN)  IV, [MAR Hold] methocarbamol, [MAR Hold] metoCLOPramide, [MAR Hold] nicotine polacrilex, [MAR Hold] ondansetron, [MAR Hold] oxyCODONE, [MAR Hold] sodium chloride, [MAR Hold] sodium chloride [MAR Hold] sodium chloride, sodium chloride irrigation, tobramycin, vancomycin  Antibiotics    Anti-infectives    Start     Dose/Rate Route Frequency Ordered Stop   10/14/14 0922  tobramycin (NEBCIN) powder       As needed 10/14/14 0922     10/14/14 0922  vancomycin (VANCOCIN) powder       As needed 10/14/14 0922     10/12/14 0815  [MAR Hold]  DAPTOmycin (CUBICIN) 650 mg in sodium chloride 0.9 % IVPB     (MAR Hold since 10/14/14 0723)   650 mg226 mL/hr over 30 Minutes Intravenous Every 24 hours 10/12/14 0259     10/12/14 0300  [MAR Hold]  rifampin (RIFADIN) capsule 300 mg     (MAR Hold since 10/14/14 0723)   300 mg Oral 2 times daily 10/12/14 0259        Subjective:   [name] seen and examined today.  Patient continues to complain of numbness in her hands that she states she sometimes takes extra doses of gabapentin which helps.  She has not been able to find a physician to help her with her hand pain.  She denies chest pain, shortness of breath, abdominal pain.  Patient is anxious about her upcoming surgery.  Objective:   Filed Vitals:   10/13/14 0510 10/13/14 1513 10/13/14 2137 10/14/14 0517  BP: 117/66 126/62 106/54 123/50  Pulse: 79 79 86 80  Temp: 98.6 F (37 C) 98.3 F (36.8 C) 98.6 F (37 C) 98.1 F (36.7 C)  TempSrc: Oral Oral Oral Oral  Resp: 20 18 16 16   Height:      Weight:      SpO2: 97% 97% 97% 96%    Wt Readings from Last 3  Encounters:  10/12/14 109.317 kg (241 lb)  09/18/14 109.997 kg (242 lb 8 oz)  09/11/14 117.935 kg (260 lb)     Intake/Output Summary (Last 24 hours) at 10/14/14 1022 Last data filed at 10/14/14 1015  Gross per 24 hour  Intake   2406 ml  Output   5425 ml  Net  -3019 ml    Exam  General: Well developed, well nourished, NAD, appears stated age  HEENT: NCAT, mucous membranes moist.   Cardiovascular: S1 S2 auscultated, no rubs, murmurs or gallops. Regular rate and rhythm.  Respiratory: Clear to auscultation bilaterally with equal chest rise  Abdomen: Soft, nontender, nondistended, + bowel sounds  Extremities: warm dry without cyanosis clubbing or edema  Neuro: AAOx3, no focal deficits, able to move extremities, however limited in the left lower extremity.  Skin: Without rashes exudates or nodules  Psych: Appropriate  Data Review   Micro Results Recent Results (from the past 240 hour(s))  Culture, blood (routine x 2)     Status: None (Preliminary result)   Collection Time: 10/12/14  4:30 AM  Result Value Ref Range Status   Specimen Description BLOOD LEFT ANTECUBITAL  Final   Special Requests BOTTLES DRAWN AEROBIC ONLY 5ML  Final   Culture  Setup Time   Final    10/12/2014 08:49 Performed at [facility]    Culture   Final           BLOOD CULTURE RECEIVED NO GROWTH TO DATE CULTURE WILL BE HELD FOR 5 DAYS BEFORE ISSUING A FINAL NEGATIVE REPORT Performed at [facility]    Report Status PENDING  Incomplete  Culture, blood (routine x 2)     Status: None (Preliminary result)   Collection Time: 10/12/14  4:36 AM  Result Value Ref Range Status   Specimen Description BLOOD LEFT HAND  Final   Special Requests BOTTLES DRAWN AEROBIC ONLY 4ML  Final   Culture  Setup Time   Final    10/12/2014 08:48 Performed at [facility]    Culture   Final           BLOOD CULTURE RECEIVED NO GROWTH TO DATE CULTURE WILL BE HELD FOR 5 DAYS BEFORE ISSUING A FINAL  NEGATIVE REPORT Performed at [facility]    Report Status PENDING  Incomplete  Surgical PCR screen     Status: Abnormal   Collection Time: 10/13/14  8:44 PM  Result Value Ref Range Status   MRSA, PCR INVALID RESULTS, SPECIMEN SENT FOR CULTURE (A) NEGATIVE Final    Comment: CRUTCHFIELD,J RN 1610 [id number] COVINGTON,N   Staphylococcus aureus INVALID RESULTS, SPECIMEN SENT FOR CULTURE (A) NEGATIVE Final    Comment: SPOKE WITH CRUTHFIEND,J RN 0981 [id number] COVINGTON,N        The Xpert SA Assay (FDA approved for NASAL specimens in patients over 57 years of age), is one component of a comprehensive surveillance program.  Test performance has been validated by [facility] for patients greater than or equal to 57 year old. It is not intended to diagnose infection nor to guide or monitor treatment.     Radiology Reports Dg Hip Complete Left  10/12/2014   CLINICAL DATA:  Left hip pain.  EXAM: LEFT HIP - COMPLETE 2+ VIEW  COMPARISON:  08/29/2014  FINDINGS: There is a left hip prosthesis with antibiotic spacer. The prosthesis is dislocated superiorly.  Remote fracture of the left pelvis traversing the roof of the acetabulum, which is distorted.  Remote periprosthetic fracture through the left femoral diaphysis, with plate and cerclage wire fixation. The fracture is still visible, but without interval displacement.  Negative right hip.  IMPRESSION: 1.  Dislocated prosthetic left hip, including antibiotic spacer. 2. Unchanged appearance of treated periprosthetic fracture in the left femoral diaphysis. 3. Unchanged appearance of remote left acetabular fracture.   Electronically Signed   By: Tiburcio PeaJonathan  Watts M.D.   On: 10/12/2014 00:33    CBC  Recent Labs Lab 10/11/14 2254 10/12/14 0430 10/14/14 0520  WBC 2.1* 2.3* 2.1*  HGB 10.7* 10.7* 10.5*  HCT 32.5* 32.1* 32.1*  PLT 97* 90* 88*  MCV 90.5 89.7 91.5  MCH 29.8 29.9 29.9  MCHC 32.9 33.3 32.7  RDW 13.3 13.2 13.3    Chemistries    Recent Labs Lab 10/11/14 2254 10/12/14 0430 10/14/14 0520  NA 137 137 136*  K 4.5 4.3 4.4  CL 100 99 99  CO2 26 26 27   GLUCOSE 100* 99 109*  BUN 11 10 14   CREATININE 0.65 0.65 0.65  CALCIUM 9.4 9.5 9.3  AST  --  25  --   ALT  --  11  --   ALKPHOS  --  104  --   BILITOT  --  0.3  --    ------------------------------------------------------------------------------------------------------------------ estimated creatinine clearance is 103.9 mL/min (by C-G formula based on Cr of 0.65). ------------------------------------------------------------------------------------------------------------------ No results for input(s): HGBA1C in the last 72 hours. ------------------------------------------------------------------------------------------------------------------ No results for input(s): CHOL, HDL, LDLCALC, TRIG, CHOLHDL, LDLDIRECT in the last 72 hours. ------------------------------------------------------------------------------------------------------------------ No results for input(s): TSH, T4TOTAL, T3FREE, THYROIDAB in the last 72 hours.  Invalid input(s): FREET3 ------------------------------------------------------------------------------------------------------------------ No results for input(s): VITAMINB12, FOLATE, FERRITIN, TIBC, IRON, RETICCTPCT in the last 72 hours.  Coagulation profile  Recent Labs Lab 10/12/14 0430  INR 1.03    No results for input(s): DDIMER in the last 72 hours.  Cardiac Enzymes No results for input(s): CKMB, TROPONINI, MYOGLOBIN in the last 168 hours.  Invalid input(s): CK ------------------------------------------------------------------------------------------------------------------ Invalid input(s): POCBNP    Bilaal Leib D.O. on 10/14/2014 at 10:22 AM  Between 7am to 7pm - Pager - [phone]  After 7pm go to [email or web address] - password [email or web address]  And look for the night coverage person covering for me after hours  [facility] Office  [phone]

## 2014-10-14 NOTE — Op Note (Signed)
[name], BROUSSARD            ACCOUNT NO.:  [id number]  MEDICAL RECORD NO.:  [id number]  LOCATION:  [facility]                         FACILITY:  [facility]  PHYSICIAN:  Madlyn Frankel. Charlann Boxer, M.D.  DATE OF BIRTH:  [birth year]  DATE OF PROCEDURE:  10/14/2014 DATE OF DISCHARGE:                              OPERATIVE REPORT   PREOPERATIVE DIAGNOSIS:  Status post infected left total hip arthroplasty with placement of a temporary antibiotic total hip arthroplasty with dislocation and failure.  POSTOP DIAGNOSIS:  Status post infected left total hip arthroplasty with placement of a temporary antibiotic total hip arthroplasty with dislocation and failure.  PROCEDURE: 1. Repeat excisional debridement and non-excisional debridement of     left hip infection. 2. Revision of left total hip arthroplasty.  COMPONENTS USED: 1. DePuy Prostalac Hip System with a size 42 x 32 Prostalac acetabular     liner cemented into the acetabulum. 2. A size 3 Summit basic cemented stem loosely cemented into the     femoral canal with a 32+ 9 head ball.  SURGEON:  Madlyn Frankel. Charlann Boxer, M.D.  ASSISTANT:  Surgical team.  ANESTHESIA:  General.  BLOOD LOSS:  About 2500 mL.  SPECIMENS:  I did take joint fluid from the joint cavity, which appeared to be slightly cloudy with no significant purulence and sent 10 mL to Pathology for them to Gram stain and culture.  DRAINS:  None.  COMPLICATIONS:  None apparent.  INDICATIONS FOR PROCEDURE:  Ms. Hamblen is a 58 year old female with a complex surgical history involving the left hip.  The onset of this began with an acetabular fracture requiring open reduction and internal fixation.  Upon the acetabular healing, she was converted to total hip arthroplasty that was complicated by early drainage in the most likely infection from further reports that identified to me.  She was subsequently transferred to [facility].  Dr. Antony Odea resected her total hip arthroplasty and placed on  a temporary antibiotic hip this year. This was complicated by femoral fracture, treated with open reduction and internal fixation.  She presented recently to an outside facility with radiographs showing dislocated left hip component including the cemented acetabular shell.  She had some persistent wound issues as well.  She had reported pain for about 3 weeks and so the duration of time from dislocation is unclear.  Nonetheless, she was admitted to the hospital to the Hospitalist Service.  I was consulted to assist with management of her hip based on Dr. Antony Odea not being available to manage at this point.  I had a lengthy discussion regarding the risks and benefits of the following procedures. The benefits including repeat excisional and nonexcisional debridement of the hip but also to try to provide her more stable hip.  I discussed that if the bone stock was poor enough that I may need to perform Girdlestone type procedure.  If I was able to, I would try to get a new Prostalac hip in place.  The risks of recurrent infection, recurrent dislocation were all stressed and reviewed, neurovascular injuries as well discussed.  PROCEDURE IN DETAIL:  The patient was brought to operative theater. Once adequate anesthesia, preoperative antibiotics, Ancef for surgical prophylaxis, she is currently  on Cubicin preoperatively and has been perioperatively.  She was positioned into the right lateral decubitus position with left side up.  The left lower extremity was then prepped and draped in sterile fashion.  Time-out was performed identifying the patient, planned procedure, extremity.  Portion of her incision was utilized excising the soft tissues down to the iliotibial band and gluteal fascia.  These layers were preliminarily identified and incised for posterior approach to the hip.  Due to the perhaps duration now that she was out from dislocation that was seen to be greater than 3 days based on  the soft tissues at present.  Significant portion of case was carried out and debridement identifying the proximal aspect of femur as well as the acetabulum.  The femoral component exposed.  The dislocated hip joint was readily evident.  The cemented acetabulum was dissociated from the femoral head and the femoral head removed.  We did identify that the femoral stem had lost its anteversion, and further investigation identified that it had loosened as well.  I did spend the vast majority of this time working on the acetabulum, with exposure once I had the acetabulum adequately exposed and debrided using a large curette.  The overall bone quality appeared to be relatively decent.  Please note that there was no obvious significant purulent fluid, the fluid that was identified other than this blood-tinged fluid around the hip joint was slightly inflamed appearing.  I did send this off for Gram stain and culture.  With the acetabulum exposed, I did identify an intact anterior posterior column and superior acetabulum.  The wall anteriorly was intact, posterior wall was a bit deficient.  Once the acetabulum was exposed, debrided adequately, I irrigated the hip out with 3 L normal saline solution at this point.  At this point, we opened up a new Prostalac acetabular liner 42 x 32 mm.  I did attempt one cementing with 1 vancomycin, 1 tobramycin; however, upon attempting this cement to identify the acetabular component was loose and thus had to repeat this process.  The 2nd time I did this, I used a single batch of cement with vanc and tobramycin as well, but only use half of the cement diminishing the amount that needed to be debrided allowing me to secure the acetabulum in place until the cement fully cured.  Upon completion here, I knew that I had the acetabular liner at position at about 35-40 degrees of abduction and forward flexed at least 20-30 degrees.  Once the cement had fully cured, I  did identify this component appeared to be stable to gross inspection.  Once this acetabular liner was in place, now I attended to the femur. With the proximal femur exposed and rotated into position carefully based on the femoral fracture identified before, the S-ROM loop extractor was placed on to the neck and the femoral component easily removed.  Again, I curettaged the femoral canal out, used a canal brush irrigator and irrigated the canal.  With the femur exposed, now I opened up the size 3 cemented Summit basic stem and mixed another batch of cement with 1 g of vancomycin and 1.2 of tobramycin.  Once this cement was ready, I conformed this cement around the cemented component to allow it to fit into the canal matching the relative size to that, that was previously placed.  Once the cement on this area had nearly fully cured, I mixed another batch of cement with 1 g of vancomycin and tobramycin and  then utilized this cement just proximally to this component into place.  I held the anteversion at this point at about 20 degrees of anteversion of the femur until the cement fully cured.  Based on the location of the tip of the trunnion in relation to the trochanter, based on the previously performed procedure, I decided to use a 32+ 9 metal ball.  Given the use of this Prostalac system, there was no way to trial without causing potential complications.  The 32 now was then impacted onto a clean and dry trunnion and the hip was reduced.  Her hip appeared to be very stable throughout the range of motion without evidence of any motion of the femoral or acetabular side and no evidence of any subluxation. Given these findings, I re-irrigated the hip at this point with another 1 L of normal saline solution.  At the time of conclusion of the case, the oozing from the soft tissues as well as bone had significantly reduced enough.  I did not feel the Hemovac drain was necessary and I did not  want to avoid the complication of having a Hemovac drain in this otherwise obese lady.  At this point, the leg was held in neutral position.  The iliotibial band and gluteal fascia were reapproximated using #1 Vicryl and a 0 V- Loc suture.  The remainder of wound was closed with 2-0 Vicryl and staples on the skin.  The skin was cleaned, dried, dressed sterilely using Mepilex dressing currently.  She was then extubated and brought to the recovery room in stable condition.  Postoperatively, she will be partial weightbearing with posterior hip precautions applied.  Due to her hemodynamics and blood loss in the operating room consistent with an acute blood loss anemia, she received a single unit of packed red blood cells.  Once it was typed and crossed, she has had multiple surgical procedures that have resulted in antibiotic formation.  We will reinstitute an Infectious Disease consult.  This information will be passed along to Dr. Antony OdeaLucio for definitive management down the road.     Madlyn FrankelMatthew D. Charlann Boxerlin, M.D.     MDO/MEDQ  D:  10/14/2014  T:  10/14/2014  Job:  [id number]

## 2014-10-14 NOTE — Clinical Social Work Note (Signed)
  CSW received a call from RN who stated that pt had surgery and is not ready for discharge today  CSW will continue to monitor for needs.  .Olamae Ferrara, LCSW [facility] Clinical Social Worker - Weekend Coverage cell #: [phone]

## 2014-10-14 NOTE — Progress Notes (Signed)
Patient ID: [name], female   DOB: 10/22/1956, 58 y.o.   MRN: [id number]  Infected left hip with dislocated temporary antibiotic laden hip replacement  Complicated by obesity, probable malnutrition and neutropenia  To OR today for repeat I&D and attempted revision hip spacer (Prostalac) versus traditional antibiotic spacer NPO Consent on chart  ID consult in post operative period  Repeat ESR and CRP 

## 2014-10-14 NOTE — Addendum Note (Signed)
Addendum  created 10/14/14 1419 by Catalina PizzaMelinda H Jaymen Fetch, CRNA   Modules edited: Orders

## 2014-10-14 NOTE — Anesthesia Postprocedure Evaluation (Signed)
  Anesthesia Post-op Note  Patient: [name]  Procedure(s) Performed: Procedure(s) (LRB): PARTIAL HIP REVISION (Left)  Patient Location: PACU  Anesthesia Type: General  Level of Consciousness: awake and alert   Airway and Oxygen Therapy: Patient Spontanous Breathing  Post-op Pain: mild  Post-op Assessment: Post-op Vital signs reviewed, Patient's Cardiovascular Status Stable, Respiratory Function Stable, Patent Airway and No signs of Nausea or vomiting  Last Vitals:  Filed Vitals:   10/14/14 0517  BP: 123/50  Pulse: 80  Temp: 36.7 C  Resp: 16    Post-op Vital Signs: stable   Complications: No apparent anesthesia complications

## 2014-10-14 NOTE — Progress Notes (Signed)
Received call from pharmacist Denny Peon(Erin) regarding interaction between Xarelto and Rifampin. Discussed other options. Xarelto order D/C'd, will switch to Lovenox 40mg  subQ daily for DVT ppx to start tomorrow.

## 2014-10-15 DIAGNOSIS — D696 Thrombocytopenia, unspecified: Secondary | ICD-10-CM

## 2014-10-15 DIAGNOSIS — R110 Nausea: Secondary | ICD-10-CM

## 2014-10-15 DIAGNOSIS — T8452XD Infection and inflammatory reaction due to internal left hip prosthesis, subsequent encounter: Secondary | ICD-10-CM

## 2014-10-15 LAB — TYPE AND SCREEN
ABO/RH(D): O POS
Antibody Screen: POSITIVE
DAT, IgG: NEGATIVE
DONOR AG TYPE: NEGATIVE
Donor AG Type: NEGATIVE
Donor AG Type: NEGATIVE
PT AG Type: NEGATIVE
UNIT DIVISION: 0
Unit division: 0
Unit division: 0

## 2014-10-15 LAB — COMPREHENSIVE METABOLIC PANEL
ALBUMIN: 2.6 g/dL — AB (ref 3.5–5.2)
ALT: 11 U/L (ref 0–35)
ANION GAP: 9 (ref 5–15)
AST: 24 U/L (ref 0–37)
Alkaline Phosphatase: 74 U/L (ref 39–117)
BUN: 13 mg/dL (ref 6–23)
CO2: 26 mEq/L (ref 19–32)
CREATININE: 0.6 mg/dL (ref 0.50–1.10)
Calcium: 8.6 mg/dL (ref 8.4–10.5)
Chloride: 102 mEq/L (ref 96–112)
GFR calc Af Amer: >90 mL/min (ref >90)
GFR calc non Af Amer: >90 mL/min (ref >90)
GLUCOSE: 118 mg/dL — AB (ref 70–99)
Potassium: 4.6 mEq/L (ref 3.7–5.3)
Sodium: 137 mEq/L (ref 137–147)
Total Bilirubin: 0.7 mg/dL (ref 0.3–1.2)
Total Protein: 5.7 g/dL — ABNORMAL LOW (ref 6.0–8.3)

## 2014-10-15 LAB — CBC
HCT: 29.9 % — ABNORMAL LOW (ref 36.0–46.0)
Hemoglobin: 9.9 g/dL — ABNORMAL LOW (ref 12.0–15.0)
MCH: 29.9 pg (ref 26.0–34.0)
MCHC: 33.1 g/dL (ref 30.0–36.0)
MCV: 90.3 fL (ref 78.0–100.0)
Platelets: 119 10*3/uL — ABNORMAL LOW (ref 150–400)
RBC: 3.31 MIL/uL — AB (ref 3.87–5.11)
RDW: 15.3 % (ref 11.5–15.5)
WBC: 4.8 10*3/uL (ref 4.0–10.5)

## 2014-10-15 LAB — GLUCOSE, CAPILLARY: GLUCOSE-CAPILLARY: 129 mg/dL — AB (ref 70–99)

## 2014-10-15 LAB — CK: CK TOTAL: 112 U/L (ref 7–177)

## 2014-10-15 LAB — PREALBUMIN: PREALBUMIN: 10.3 mg/dL — AB (ref 17.0–34.0)

## 2014-10-15 MED ORDER — PRO-STAT SUGAR FREE PO LIQD
30.0000 mL | Freq: Three times a day (TID) | ORAL | Status: DC
  Administered 2014-10-15 – 2014-10-16 (×2): 30 mL via ORAL
  Filled 2014-10-15 (×5): qty 30

## 2014-10-15 MED ORDER — SODIUM CHLORIDE 0.9 % IV SOLN
600.0000 mg | Freq: Two times a day (BID) | INTRAVENOUS | Status: DC
  Administered 2014-10-16: 600 mg via INTRAVENOUS
  Filled 2014-10-15 (×2): qty 600

## 2014-10-15 MED ORDER — ADULT MULTIVITAMIN W/MINERALS CH
1.0000 | ORAL_TABLET | Freq: Every day | ORAL | Status: DC
  Administered 2014-10-15 – 2014-10-16 (×2): 1 via ORAL
  Filled 2014-10-15 (×2): qty 1

## 2014-10-15 MED ORDER — HYDROMORPHONE HCL 1 MG/ML IJ SOLN
1.0000 mg | INTRAMUSCULAR | Status: DC | PRN
  Administered 2014-10-15 – 2014-10-16 (×11): 1 mg via INTRAVENOUS
  Filled 2014-10-15 (×11): qty 1

## 2014-10-15 NOTE — Clinical Social Work Note (Addendum)
Clinical Social Work Department CLINICAL SOCIAL WORK PLACEMENT NOTE 10/16/2014  [facility]  [name]  Account Number:  [id number] Admit date:  10/11/2014  Clinical Social Worker:  Robin SearingJANET Indiah Heyden, LCSWA  Date/time:  10/15/2014 03:28 PM  Clinical Social Work is seeking post-discharge placement for this patient at the following level of care:   SKILLED NURSING   (*CSW will update this form in Epic as items are completed)   10/15/2014  Patient/family provided with [facility] Department of Clinical Social Work's list of facilities offering this level of care within the geographic area requested by the patient (or if unable, by the patient's family).  10/15/2014  Patient/family informed of their freedom to choose among providers that offer the needed level of care, that participate in Medicare, Medicaid or managed care program needed by the patient, have an available bed and are willing to accept the patient.  10/15/2014  Patient/family informed of [facility]' ownership interest in [facility], as well as of the fact that they are under no obligation to receive care at this facility.  PASARR submitted to EDS on 10/15/2014 PASARR number received on 10/15/2014  FL2 transmitted to all facilities in geographic area requested by pt/family on  10/15/2014 FL2 transmitted to all facilities within larger geographic area on   Patient informed that his/her managed care company has contracts with or will negotiate with  certain facilities, including the following:     Patient/family informed of bed offers received:  10/16/14 [facility]  Physician recommends and patient chooses bed at    [facility]  on  10/16/14 Patient to be transferred to facility by [facility] Patient and family notified of transfer on 10/16/14 Name of family member notified:  husband  The following physician request were entered in Epic:   Additional  Comments: Reece LevyJanet Lahna Nath, MSW, Theresia MajorsLCSWA [phone]

## 2014-10-15 NOTE — Evaluation (Signed)
Occupational Therapy Evaluation Patient Details Name: [name] MRN: [id number] DOB: 17-Mar-1956 Today's Date: 10/15/2014    History of Present Illness Antibiotic spacer dislocation.  I&D and replacement of spacer 10/14/14. Antibiotic spacer L hip and ORIF L femur s/p spiral fx 08/24/14;     Clinical Impression   Pt was sponge bathing and dressing at a modified independent level in her w/c prior to admission. She used a female urinal while sitting in her w/c. She reports that she could transfer bed to w/c. Pt did not always adhere to posterior hip precautions during ADL, although she has had extensive education and therapy over the last year.  She is fearful of falling and is focused on her past surgery and medical history. Pt is agreeable to SNF for short term rehab, if the facility meets her standards. Will follow acutely.    Follow Up Recommendations  SNF    Equipment Recommendations  None recommended by OT    Recommendations for Other Services       Precautions / Restrictions Precautions Precautions: Posterior Hip;Fall Precaution Comments: Pt is able to state hip precautions, but does not generalize. Restrictions Weight Bearing Restrictions: Yes LLE Weight Bearing: Partial weight bearing LLE Partial Weight Bearing Percentage or Pounds: 50%      Mobility Bed Mobility Overal bed mobility: Needs Assistance Bed Mobility: Supine to Sit;Sit to Supine     Supine to sit: Mod assist Sit to supine: Mod assist   General bed mobility comments: cues for sequence and assist with bilat LEs  Transfers          General transfer comment: did not perform transfer this visit    Balance                                            ADL Overall ADL's : Needs assistance/impaired Eating/Feeding: Independent;Sitting   Grooming: Wash/dry hands;Wash/dry face;Oral care;Set up;Sitting   Upper Body Bathing: Set up;Sitting   Lower Body Bathing: Maximal  assistance;Bed level   Upper Body Dressing : Set up;Sitting   Lower Body Dressing: Maximal assistance;Bed level                 General ADL Comments: Pt admits to washing her feet by placing a pan of water on the floor, does not have a long handled sponge.  Also states she reaches her R foot by crossing it over the L knee.     Vision                 Additional Comments: no change from baseline   Perception     Praxis      Pertinent Vitals/Pain Pain Assessment: 0-10 Pain Score: 5  Pain Location: L hip/leg Pain Descriptors / Indicators: Aching Pain Intervention(s): Premedicated before session;Monitored during session;Limited activity within patient's tolerance;Repositioned     Hand Dominance Right   Extremity/Trunk Assessment Upper Extremity Assessment Upper Extremity Assessment: Overall WFL for tasks assessed   Lower Extremity Assessment Lower Extremity Assessment: Defer to PT evaluation RLE Deficits / Details: Generalized weakness LLE Deficits / Details: 2/5 hip strength with AAROM at hip to 70 flex and 10 abd   Cervical / Trunk Assessment Cervical / Trunk Assessment: Kyphotic   Communication Communication Communication: No difficulties   Cognition Arousal/Alertness: Awake/alert Behavior During Therapy: WFL for tasks assessed/performed Overall Cognitive Status: History of cognitive impairments - at baseline  Memory: Decreased short-term memory             General Comments       Exercises Exercises: Total Joint     Shoulder Instructions      Home Living Family/patient expects to be discharged to:: Skilled nursing facility Living Arrangements: Spouse/significant other                               Additional Comments: Pt has a reacher, sock aide, and leg lifter.      Prior Functioning/Environment Level of Independence: Needs assistance  Gait / Transfers Assistance Needed: used power w/c for most mobility ADL's /  Homemaking Assistance Needed: Pt primarily used a female urinal, sponge bathed, wore dresses primarily and slip on shoes.  Sister washed her hair in the sink.        OT Diagnosis: Generalized weakness;Acute pain   OT Problem List: Decreased strength;Decreased activity tolerance;Impaired balance (sitting and/or standing);Decreased knowledge of use of DME or AE;Decreased knowledge of precautions;Obesity;Pain   OT Treatment/Interventions: Self-care/ADL training;Therapeutic activities;DME and/or AE instruction;Patient/family education    OT Goals(Current goals can be found in the care plan section) Acute Rehab OT Goals Patient Stated Goal: Wants OT to "work on her shoulders and hands" OT Goal Formulation: With patient Time For Goal Achievement: 10/29/14 Potential to Achieve Goals: Good ADL Goals Pt Will Perform Lower Body Bathing: with supervision;sitting/lateral leans;with adaptive equipment Pt Will Perform Lower Body Dressing: with supervision;sitting/lateral leans;with adaptive equipment Pt Will Transfer to Toilet: with supervision;stand pivot transfer;bedside commode Pt Will Perform Toileting - Clothing Manipulation and hygiene: with supervision;sitting/lateral leans Additional ADL Goal #1: Pt will adhere to posterior hip precautions during ADL independently.  OT Frequency: Min 2X/week   Barriers to D/C:            Co-evaluation              End of Session    Activity Tolerance: Patient tolerated treatment well Patient left: in bed;with call bell/phone within reach;with nursing/sitter in room   Time: 1410-1447 OT Time Calculation (min): 37 min Charges:  OT General Charges $OT Visit: 1 Procedure OT Evaluation $Initial OT Evaluation Tier I: 1 Procedure OT Treatments $Self Care/Home Management : 23-37 mins G-Codes:    Evern BioMayberry, Whitney Bingaman Lynn 10/15/2014, 2:52 PM  [phone]

## 2014-10-15 NOTE — Clinical Social Work Note (Signed)
CSW met with patient and her sister at bedside. Patient is agreeable to considering SNF if we can find her a " 4 star or better".  Discussed SNF search process with her and she is agreeable to a search in [place], [place], [place] and [place] counties. CSW will proceed with SNF search and update- full assessment to follow.    Eduard Clos, MSW, [place]

## 2014-10-15 NOTE — Progress Notes (Signed)
Dr Laureen Ochslin,blood cx done 12/18,wound cx done 12/20.another blood cx was ordered on 12/20. Pt refuses to be stuck by lab again. Thank you Doren CustardBeverly, Jun Rightmyer D

## 2014-10-15 NOTE — Progress Notes (Signed)
[facility]                                                                              Patient Demographics  [name], is a 58 y.o. female, DOB - 12-18-1955, UJW:[id number]  Admit date - 10/11/2014   Admitting Physician Lorretta Harp, MD  Outpatient Primary MD for the patient is Carolynn Serve, MD  LOS - 4   Chief Complaint  Patient presents with  . Hip Pain    left      HPI on 10/12/2014 by Dr. Lorretta Harp  [name] is a 58 y.o. female with past medical history for GERD, migraine headache depression, left hip joint infection, venous stasis dermatitis, who presents with worsening left hip pain.  Patient reports that she had left hip arthroplasty in Feb 2012 and In Jan 2015 she had the arthoplasty replaced. She then developed an infection in the left hip which required surgical debridement in Feb 2015. After that, she received several months of IV abx with transition to Oral abx. She then transferred care to Dr. Despina Hick on Oct 31st. At that time her left hip continued to be infected. She had abx spacers in place. She is currently receiving daptomycin and rifampin. In the past 2 weeks, her left hip pain has been progressively getting worse. She can not bear weight on left leg. She did not have new injury. She went to [facility] for antibiotic infusion and had an x-ray, which showed left hip joint dislocation. She comes to the emergency room for further evaluation and treatment. Patient denies fever, chills, headache, neck pain, chest pain, shortness of breath, abdominal pain, nausea, vomiting, diarrhea, weakness, dizziness, syncope. Work up in the ED demonstrates dislocation of left hip joint by x-ray. Neutropenia with WBC 2.1. Patient is admitted to inpatient for further evaluation and treatment. Orthopedic surgeon was consulted.  Assessment & Plan   Left hip pain/hip dislocation/left septic arthritis of the hip -Continue daptomycin and  rifampin -Orthopedics consulted and appreciated, s/p partial hip revision, repeat excisional debridement  -Continue pain control as needed -Will need to follow-up with infectious disease, Dr. Orvan Falconer, at discharge -PT/OT consulted and pending evaluation -Social work consulted for possible placement  Osteoarthritis -Patient will likely need bone density scan upon discharge -Vitamin D level pending  Depression -Continue Cymbalta  Obstructive sleep apnea -Continue CPAP daily at bedtime  GERD -Continue PPI  Pancytopenia -Likely secondary to chronic infection, labs appear stable compared to previous admissions  Neuropathy -Increased gabapentin from 2400mg  daily to 2800mg  daily.  Patient has tried lyrica in the past -Encouraged patient to seek EMG or nerve conduction studies as an outpatient -Patient states she would like neurology to see her while she is in the hospital -Spoke with Dr. Vonita Moss, neurologist, via phone who also recommended outpatient neuro follow up and even pain management. No further recommendations or testing.   Code Status: Full  Family Communication: None at bedside  Disposition Plan: Admitted  Time Spent in minutes   30 minutes  Procedures  None  Consults   Orthopedic Surgery Neurology, Dr. Roseanne Reno, via phone  DVT Prophylaxis  Lovenox  Lab Results  Component Value Date   PLT 119*  10/15/2014    Medications  Scheduled Meds: . clotrimazole   Topical BID  . DAPTOmycin (CUBICIN)  IV  650 mg Intravenous Q24H  . darifenacin  15 mg Oral QHS  . docusate sodium  100 mg Oral BID  . DULoxetine  60 mg Oral Daily  . enoxaparin (LOVENOX) injection  40 mg Subcutaneous Q24H  . esomeprazole  40 mg Oral Daily  . ferrous sulfate  325 mg Oral BH-q7a  . gabapentin  400 mg Oral 3 times per day  . gabapentin  800 mg Oral 2 times per day  . Naloxegol Oxalate  25 mg Oral QHS  . oxymorphone  20 mg Oral 3 times per day  . polyethylene glycol  17 g Oral BID  .  rifampin  300 mg Oral BID  . saccharomyces boulardii  250 mg Oral BID  . traZODone  150-300 mg Oral QHS  . triamcinolone cream  1 application Topical BID   Continuous Infusions: . lactated ringers    . sodium chloride 0.9 % 1,000 mL with potassium chloride 10 mEq infusion 75 mL/hr at 10/14/14 1505   PRN Meds:.acetaminophen **OR** acetaminophen, alum & mag hydroxide-simeth, bisacodyl, docusate sodium, HYDROmorphone (DILAUDID) injection, hydrOXYzine, menthol-cetylpyridinium **OR** phenol, methocarbamol **OR** methocarbamol (ROBAXIN)  IV, methocarbamol, metoCLOPramide **OR** metoCLOPramide (REGLAN) injection, metoCLOPramide, nicotine polacrilex, ondansetron **OR** ondansetron (ZOFRAN) IV, ondansetron, oxyCODONE, sodium chloride, sodium chloride  Antibiotics    Anti-infectives    Start     Dose/Rate Route Frequency Ordered Stop   10/14/14 0922  tobramycin (NEBCIN) powder  Status:  Discontinued       As needed 10/14/14 0922 10/14/14 1051   10/14/14 0922  vancomycin (VANCOCIN) powder  Status:  Discontinued       As needed 10/14/14 0922 10/14/14 1051   10/12/14 0815  DAPTOmycin (CUBICIN) 650 mg in sodium chloride 0.9 % IVPB     650 mg226 mL/hr over 30 Minutes Intravenous Every 24 hours 10/12/14 0259     10/12/14 0300  rifampin (RIFADIN) capsule 300 mg     300 mg Oral 2 times daily 10/12/14 0259        Subjective:   [name] seen and examined today.  Patient continues to complain of numbness in her hands and would like to see a neurologist.   She has not been able to find a physician to help her with her hand pain.  She denies chest pain, shortness of breath, abdominal pain.    Objective:   Filed Vitals:   10/15/14 0000 10/15/14 0121 10/15/14 0537 10/15/14 0800  BP:  132/60 132/52   Pulse:  96 101   Temp:  100.3 F (37.9 C) 98.4 F (36.9 C)   TempSrc:  Oral Oral   Resp: 16 16 16 16   Height:      Weight:      SpO2: 98% 100% 100%     Wt Readings from Last 3 Encounters:   10/12/14 109.317 kg (241 lb)  09/18/14 109.997 kg (242 lb 8 oz)  09/11/14 117.935 kg (260 lb)     Intake/Output Summary (Last 24 hours) at 10/15/14 1113 Last data filed at 10/15/14 0954  Gross per 24 hour  Intake   2860 ml  Output   1100 ml  Net   1760 ml    Exam  General: Well developed, well nourished, NAD, appears stated age  HEENT: NCAT, mucous membranes moist.   Cardiovascular: S1 S2 auscultated, no rubs, murmurs or gallops. Regular rate and rhythm.  Respiratory:  Clear to auscultation bilaterally with equal chest rise  Abdomen: Soft, nontender, nondistended, + bowel sounds  Extremities: warm dry without cyanosis clubbing or edema  Neuro: AAOx3, .  Skin: Without rashes exudates or nodules  Psych: Appropriate  Data Review   Micro Results Recent Results (from the past 240 hour(s))  Culture, blood (routine x 2)     Status: None (Preliminary result)   Collection Time: 10/12/14  4:30 AM  Result Value Ref Range Status   Specimen Description BLOOD LEFT ANTECUBITAL  Final   Special Requests BOTTLES DRAWN AEROBIC ONLY 5ML  Final   Culture  Setup Time   Final    10/12/2014 08:49 Performed at [facility]    Culture   Final           BLOOD CULTURE RECEIVED NO GROWTH TO DATE CULTURE WILL BE HELD FOR 5 DAYS BEFORE ISSUING A FINAL NEGATIVE REPORT Performed at [facility]    Report Status PENDING  Incomplete  Culture, blood (routine x 2)     Status: None (Preliminary result)   Collection Time: 10/12/14  4:36 AM  Result Value Ref Range Status   Specimen Description BLOOD LEFT HAND  Final   Special Requests BOTTLES DRAWN AEROBIC ONLY 4ML  Final   Culture  Setup Time   Final    10/12/2014 08:48 Performed at [facility]    Culture   Final           BLOOD CULTURE RECEIVED NO GROWTH TO DATE CULTURE WILL BE HELD FOR 5 DAYS BEFORE ISSUING A FINAL NEGATIVE REPORT Performed at [facility]    Report Status PENDING  Incomplete  Surgical  PCR screen     Status: Abnormal   Collection Time: 10/13/14  8:44 PM  Result Value Ref Range Status   MRSA, PCR INVALID RESULTS, SPECIMEN SENT FOR CULTURE (A) NEGATIVE Final    Comment: CRUTCHFIELD,J RN 1324 [id number] [id number] COVINGTON,N   Staphylococcus aureus INVALID RESULTS, SPECIMEN SENT FOR CULTURE (A) NEGATIVE Final    Comment: SPOKE WITH CRUTHFIEND,J RN [id number] [id number] COVINGTON,N        The Xpert SA Assay (FDA approved for NASAL specimens in patients over 57 years of age), is one component of a comprehensive surveillance program.  Test performance has been validated by [facility] for patients greater than or equal to 57 year old. It is not intended to diagnose infection nor to guide or monitor treatment.   Anaerobic culture     Status: None (Preliminary result)   Collection Time: 10/14/14 10:17 AM  Result Value Ref Range Status   Specimen Description WOUND LEFT HIP FLUID  Final   Special Requests PATIENT ON FOLLOWING ANCEF, DAPTOMYCIN, RIFAMPIN  Final   Gram Stain   Final    RARE WBC PRESENT, PREDOMINANTLY PMN NO SQUAMOUS EPITHELIAL CELLS SEEN NO ORGANISMS SEEN Performed at [facility]    Culture PENDING  Incomplete   Report Status PENDING  Incomplete  Body fluid culture     Status: None (Preliminary result)   Collection Time: 10/14/14 10:17 AM  Result Value Ref Range Status   Specimen Description WOUND LEFT HIP FLUID  Final   Special Requests PATIENT ON FOLLOWING ANCEF,DAPTOMYCIN, RIFAMPIN  Final   Gram Stain   Final    FEW WBC PRESENT, PREDOMINANTLY PMN NO ORGANISMS SEEN Performed at [facility]    Culture   Final    NO GROWTH 1 DAY Performed at [facility]  Report Status PENDING  Incomplete    Radiology Reports Dg Hip Complete Left  10/12/2014   CLINICAL DATA:  Left hip pain.  EXAM: LEFT HIP - COMPLETE 2+ VIEW  COMPARISON:  08/29/2014  FINDINGS: There is a left hip prosthesis with antibiotic spacer. The prosthesis is dislocated  superiorly.  Remote fracture of the left pelvis traversing the roof of the acetabulum, which is distorted.  Remote periprosthetic fracture through the left femoral diaphysis, with plate and cerclage wire fixation. The fracture is still visible, but without interval displacement.  Negative right hip.  IMPRESSION: 1.  Dislocated prosthetic left hip, including antibiotic spacer. 2. Unchanged appearance of treated periprosthetic fracture in the left femoral diaphysis. 3. Unchanged appearance of remote left acetabular fracture.   Electronically Signed   By: Tiburcio PeaJonathan  Watts M.D.   On: 10/12/2014 00:33   Dg Pelvis Portable  10/14/2014   CLINICAL DATA:  Postop total hip replacement  EXAM: PORTABLE PELVIS 1-2 VIEWS  COMPARISON:  None.  FINDINGS: There is complicated left hip arthroplasty with antibiotic laden acetabular cup and antibiotic laden methylmethacrylate around the femoral stem component. There is no acute dislocation. There is a fracture of the medial wall of the acetabulum. There are cerclage wires transfixing the mid femoral diaphysis incompletely included in the field of view. There are postsurgical changes in the soft tissues surrounding the right hip.  IMPRESSION: Complicate left hip arthroplasty with antibiotic laden acetabular cup antibiotic laden methylmethacrylate around the femoral stem component. No dislocation.  Stable left acetabular fracture.   Electronically Signed   By: Elige KoHetal  Patel   On: 10/14/2014 12:27    CBC  Recent Labs Lab 10/11/14 2254 10/12/14 0430 10/14/14 0520 10/15/14 0519  WBC 2.1* 2.3* 2.1* 4.8  HGB 10.7* 10.7* 10.5* 9.9*  HCT 32.5* 32.1* 32.1* 29.9*  PLT 97* 90* 88* 119*  MCV 90.5 89.7 91.5 90.3  MCH 29.8 29.9 29.9 29.9  MCHC 32.9 33.3 32.7 33.1  RDW 13.3 13.2 13.3 15.3    Chemistries   Recent Labs Lab 10/11/14 2254 10/12/14 0430 10/14/14 0520 10/15/14 0519  NA 137 137 136* 137  K 4.5 4.3 4.4 4.6  CL 100 99 99 102  CO2 26 26 27 26   GLUCOSE 100* 99  109* 118*  BUN 11 10 14 13   CREATININE 0.65 0.65 0.65 0.60  CALCIUM 9.4 9.5 9.3 8.6  AST  --  25  --  24  ALT  --  11  --  11  ALKPHOS  --  104  --  74  BILITOT  --  0.3  --  0.7   ------------------------------------------------------------------------------------------------------------------ estimated creatinine clearance is 103.9 mL/min (by C-G formula based on Cr of 0.6). ------------------------------------------------------------------------------------------------------------------ No results for input(s): HGBA1C in the last 72 hours. ------------------------------------------------------------------------------------------------------------------ No results for input(s): CHOL, HDL, LDLCALC, TRIG, CHOLHDL, LDLDIRECT in the last 72 hours. ------------------------------------------------------------------------------------------------------------------ No results for input(s): TSH, T4TOTAL, T3FREE, THYROIDAB in the last 72 hours.  Invalid input(s): FREET3 ------------------------------------------------------------------------------------------------------------------ No results for input(s): VITAMINB12, FOLATE, FERRITIN, TIBC, IRON, RETICCTPCT in the last 72 hours.  Coagulation profile  Recent Labs Lab 10/12/14 0430  INR 1.03    No results for input(s): DDIMER in the last 72 hours.  Cardiac Enzymes No results for input(s): CKMB, TROPONINI, MYOGLOBIN in the last 168 hours.  Invalid input(s): CK ------------------------------------------------------------------------------------------------------------------ Invalid input(s): POCBNP    Haze Antillon D.O. on 10/15/2014 at 11:13 AM  Between 7am to 7pm - Pager - [phone]  After 7pm go to [email or web address] -  password [email or web address]  And look for the night coverage person covering for me after hours  [facility] Office  [phone]

## 2014-10-15 NOTE — Consult Note (Signed)
[facility]  Total days of antibiotics 48        Day 48 daptomycin        Day 48 rifampin                Reason for Consult: mrsa pji    Referring Physician: olin  Principal Problem:   Hip dislocation, left Active Problems:   Septic arthritis of hip   Depression   OA (osteoarthritis)   Left hip pain   Gastroesophageal reflux disease with esophagitis   Pancytopenia   OSA on CPAP    HPI: [name] is a 58 y.o. female with complicated orthopedic hx after sustaining left acetabular fx with non-union s/p ORIF 09/11/10, followed by THA 11/2010, then revision THA 10/31/13 c/b early wound infection, 8 x 5 cm fluid collection in lateral thigh s/p I x D 12/06/13 with OR cultures growing MRSA ( vanco S, clinda R, gent S, rif S, bactrim S, doxy S). She underwent femoral and acetabular components removal with abtx spacer on 12/14/13. She was discharged on vancomycin where she sustained AKI and leukopenia thus transitioned to daptomycin. She finished 9 wks of IV therapy which ended on 02/06/14. Important to note, she finished the last 3 wk of her therapy with  weekly doses of dalbavancin x 3 since she was unable to afford daptomycin. she was seen by dr. Wynelle Link for consideration of re-implantation.At time of surgery, the joint fluid appeared seropurlent, inflamed tissue at joint, the decision at that time was to do repeat I x D of left hip with exchange of antibiotic spacer and ORIF of left femur fracture on 08/24/14. OR cx were NGTD. She was discharged on a 6 wk course of daptomycin plus rifampin to end on 10/09/14, however her inflammatory markers were still elevated when she saw Dr. Megan Salon, her ID physician. Decision was made to extend her IV course of therapy for addn 4 wk to end of 11/01/2014. Shortly after seeing Dr. Megan Salon, she reports having increasing left sided pain, imaging was taken last week that showed dislocated left hip component. She was taken the OR on  10/13/14 for excisional debridement of left hip infection, and revision of left total hip arthroplasty. There was not any obvious purulent fluid in the joint. OR cx sent again from 10/13/14. Patient was kept on daptomycin. She remains afebrile, still having post op left hip pain. She has many questions regarding antibiotic choices to treat her infection. Dr. Alvan Dame recommending that she goes to rehab for remaining course of treatment. She states that she does not get diarrhea, or constipation, no thrush or yeast infection. Had some nausea but improved with probiotics.  Past Medical History  Diagnosis Date  . Tenosynovitis of fingers     RIGHT RING AND SMALL-HAD SURGERY 07/12/14 [facility]  . Bronchitis     dx  07-04-2014 (approx) will finished antibiotic 07-14-2014  . History of concussion     x4  --  no residual  . Migraines   . Depression   . 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 GERD (gastroesophageal reflux disease)   . H/O hiatal hernia   . Chronic constipation   . Chronic narcotic use   . Nocturia   . SUI (stress urinary incontinence, female)   . OA (osteoarthritis)     hips, knees, hips  .  Infection of left prosthetic hip joint     left total arthroplasty done 2012/  jan 2015 removal of prosthesis  . Full dentures   . History of cellulitis     lower extremities  . Thinning of skin     easily tears  . Venous stasis dermatitis     BILATERAL LEGS  . Productive cough     RESOLVED - QUIT SMOKING  . Memory problem   . OSA on CPAP     study x3   last one few yrs ago  cpap recommended -- DOES NOT USE OR HAVE CPAP    Allergies:  Allergies  Allergen Reactions  . Vancomycin Other (See Comments)    LEUKOPENIA    MEDICATIONS: . clotrimazole   Topical BID  . DAPTOmycin (CUBICIN)  IV  650 mg Intravenous Q24H  . darifenacin  15 mg Oral QHS  . docusate sodium  100 mg Oral BID  .  DULoxetine  60 mg Oral Daily  . enoxaparin (LOVENOX) injection  40 mg Subcutaneous Q24H  . esomeprazole  40 mg Oral Daily  . ferrous sulfate  325 mg Oral BH-q7a  . gabapentin  400 mg Oral 3 times per day  . gabapentin  800 mg Oral 2 times per day  . Naloxegol Oxalate  25 mg Oral QHS  . oxymorphone  20 mg Oral 3 times per day  . polyethylene glycol  17 g Oral BID  . rifampin  300 mg Oral BID  . saccharomyces boulardii  250 mg Oral BID  . traZODone  150-300 mg Oral QHS  . triamcinolone cream  1 application Topical BID    History  Substance Use Topics  . Smoking status: Former Smoker -- 1.00 packs/day for 30 years    Types: Cigarettes    Quit date: 07/09/2014  . Smokeless tobacco: Never Used     Comment: PT STATES ATTEMPTING TO QUIT SMOKING 07-09-2014--  USING NICOTIN LOZENGERS  . Alcohol Use: No    Family History  Problem Relation Age of Onset  . COPD Mother   . Hypertension Father   . Pancreatic cancer Father   . COPD Brother     Review of Systems  Constitutional: Negative for fever, chills, diaphoresis, activity change, appetite change, fatigue and unexpected weight change.  HENT: Negative for congestion, sore throat, rhinorrhea, sneezing, trouble swallowing and sinus pressure.  Eyes: Negative for photophobia and visual disturbance.  Respiratory: Negative for cough, chest tightness, shortness of breath, wheezing and stridor.  Cardiovascular: Negative for chest pain, palpitations and leg swelling.  Gastrointestinal: Negative for nausea, vomiting, abdominal pain, diarrhea, constipation, blood in stool, abdominal distention and anal bleeding.  Genitourinary: Negative for dysuria, hematuria, flank pain and difficulty urinating.  Musculoskeletal: left hip pain Skin: Negative for color change, pallor, rash and wound.  Neurological: Negative for dizziness, tremors, weakness and light-headedness.  Hematological: Negative for adenopathy. Does not bruise/bleed easily.    Psychiatric/Behavioral: Negative for behavioral problems, confusion, sleep disturbance, dysphoric mood, decreased concentration and agitation.     OBJECTIVE: Temp:  [97.6 F (36.4 C)-100.3 F (37.9 C)] 98.4 F (36.9 C) (12/21 0537) Pulse Rate:  [96-107] 101 (12/21 0537) Resp:  [12-18] 18 (12/21 1126) BP: (113-145)/(52-83) 132/52 mmHg (12/21 0537) SpO2:  [94 %-100 %] 100 % (12/21 0537) Physical Exam  Constitutional:  oriented to person, place, and time. appears well-developed and well-nourished. No distress.  HENT:  Mouth/Throat: Oropharynx is clear and moist. No oropharyngeal exudate.  Cardiovascular: Normal rate, regular rhythm  and normal heart sounds. Exam reveals no gallop and no friction rub.  No murmur heard.  Pulmonary/Chest: Effort normal and breath sounds normal. No respiratory distress.  has no wheezes.  Abdominal: Soft. Bowel sounds are normal.  exhibits no distension. There is no tenderness.  Lymphadenopathy: no cervical adenopathy.  Neurological: alert and oriented to person, place, and time.  Skin: Skin is warm and dry. Left hip small bandage from surgery. No surrounding erythema Psychiatric: a normal mood and affect. behavior is normal.   LABS: Results for orders placed or performed during the hospital encounter of 10/11/14 (from the past 48 hour(s))  Surgical PCR screen     Status: Abnormal   Collection Time: 10/13/14  8:44 PM  Result Value Ref Range   MRSA, PCR INVALID RESULTS, SPECIMEN SENT FOR CULTURE (A) NEGATIVE    Comment: CRUTCHFIELD,J RN [id number] COVINGTON,N   Staphylococcus aureus INVALID RESULTS, SPECIMEN SENT FOR CULTURE (A) NEGATIVE    Comment: SPOKE WITH CRUTHFIEND,J RN [id number] COVINGTON,N        The Xpert SA Assay (FDA approved for NASAL specimens in patients over 55 years of age), is one component of a comprehensive surveillance program.  Test performance has been validated by [employer] for patients greater than or equal to 3 year  old. It is not intended to diagnose infection nor to guide or monitor treatment.   MRSA culture     Status: None (Preliminary result)   Collection Time: 10/14/14  3:27 AM  Result Value Ref Range   Specimen Description NASOPHARYNGEAL    Special Requests NONE    Culture NO GROWTH Performed at [facility]     Report Status PENDING   CBC     Status: Abnormal   Collection Time: 10/14/14  5:20 AM  Result Value Ref Range   WBC 2.1 (L) 4.0 - 10.5 K/uL   RBC 3.51 (L) 3.87 - 5.11 MIL/uL   Hemoglobin 10.5 (L) 12.0 - 15.0 g/dL   HCT 32.1 (L) 36.0 - 46.0 %   MCV 91.5 78.0 - 100.0 fL   MCH 29.9 26.0 - 34.0 pg   MCHC 32.7 30.0 - 36.0 g/dL   RDW 13.3 11.5 - 15.5 %   Platelets 88 (L) 150 - 400 K/uL    Comment: CONSISTENT WITH PREVIOUS RESULT  Basic metabolic panel     Status: Abnormal   Collection Time: 10/14/14  5:20 AM  Result Value Ref Range   Sodium 136 (L) 137 - 147 mEq/L   Potassium 4.4 3.7 - 5.3 mEq/L   Chloride 99 96 - 112 mEq/L   CO2 27 19 - 32 mEq/L   Glucose, Bld 109 (H) 70 - 99 mg/dL   BUN 14 6 - 23 mg/dL   Creatinine, Ser 0.65 0.50 - 1.10 mg/dL   Calcium 9.3 8.4 - 10.5 mg/dL   GFR calc non Af Amer >90 >90 mL/min   GFR calc Af Amer >90 >90 mL/min    Comment: (NOTE) The eGFR has been calculated using the CKD EPI equation. This calculation has not been validated in all clinical situations. eGFR's persistently <90 mL/min signify possible Chronic Kidney Disease.    Anion gap 10 5 - 15  Type and screen     Status: None   Collection Time: 10/14/14  8:59 AM  Result Value Ref Range   ABO/RH(D) O POS    Antibody Screen POS    Sample Expiration 10/17/2014    Antibody Identification ANTI E ANTI FYA (  Duffy a)    PT AG Type NEGATIVE FOR DUFFY A ANTIGEN    DAT, IgG NEG    Unit Number [id number]    Blood Component Type RED CELLS,LR    Unit division 00    Status of Unit REL FROM [facility]    Donor AG Type NEGATIVE FOR E ANTIGEN    Transfusion Status DO NOT ISSUE FOR  TRANSFUSION    Crossmatch Result INCOMPATIBLE    Unit Number [id number]    Blood Component Type RED CELLS,LR    Unit division 00    Status of Unit ISSUED,FINAL    Donor AG Type      NEGATIVE FOR E ANTIGEN NEGATIVE FOR DUFFY A ANTIGEN   Transfusion Status OK TO TRANSFUSE    Crossmatch Result COMPATIBLE    Unit Number [id number]    Blood Component Type RED CELLS,LR    Unit division 00    Status of Unit ISSUED,FINAL    Donor AG Type      NEGATIVE FOR E ANTIGEN NEGATIVE FOR DUFFY A ANTIGEN   Transfusion Status OK TO TRANSFUSE    Crossmatch Result COMPATIBLE   Prepare RBC (crossmatch)     Status: None   Collection Time: 10/14/14 10:00 AM  Result Value Ref Range   Order Confirmation ORDER PROCESSED BY BLOOD BANK   Anaerobic culture     Status: None (Preliminary result)   Collection Time: 10/14/14 10:17 AM  Result Value Ref Range   Specimen Description WOUND LEFT HIP FLUID    Special Requests PATIENT ON FOLLOWING ANCEF, DAPTOMYCIN, RIFAMPIN    Gram Stain      RARE WBC PRESENT, PREDOMINANTLY PMN NO SQUAMOUS EPITHELIAL CELLS SEEN NO ORGANISMS SEEN Performed at [facility]    Culture      NO ANAEROBES ISOLATED; CULTURE IN PROGRESS FOR 5 DAYS Performed at [facility]    Report Status PENDING   Body fluid culture     Status: None (Preliminary result)   Collection Time: 10/14/14 10:17 AM  Result Value Ref Range   Specimen Description WOUND LEFT HIP FLUID    Special Requests PATIENT ON FOLLOWING ANCEF,DAPTOMYCIN, RIFAMPIN    Gram Stain      FEW WBC PRESENT, PREDOMINANTLY PMN NO ORGANISMS SEEN Performed at [facility]    Culture      NO GROWTH 1 DAY Performed at [facility]    Report Status PENDING   Prepare RBC     Status: None   Collection Time: 10/14/14  3:00 PM  Result Value Ref Range   Order Confirmation ORDER PROCESSED BY BLOOD BANK   CK     Status: None   Collection Time: 10/15/14  5:19 AM  Result Value Ref Range   Total CK  112 7 - 177 U/L  Comprehensive metabolic panel     Status: Abnormal   Collection Time: 10/15/14  5:19 AM  Result Value Ref Range   Sodium 137 137 - 147 mEq/L   Potassium 4.6 3.7 - 5.3 mEq/L   Chloride 102 96 - 112 mEq/L   CO2 26 19 - 32 mEq/L   Glucose, Bld 118 (H) 70 - 99 mg/dL   BUN 13 6 - 23 mg/dL   Creatinine, Ser 0.60 0.50 - 1.10 mg/dL   Calcium 8.6 8.4 - 10.5 mg/dL   Total Protein 5.7 (L) 6.0 - 8.3 g/dL   Albumin 2.6 (L) 3.5 - 5.2 g/dL   AST 24 0 - 37 U/L  ALT 11 0 - 35 U/L   Alkaline Phosphatase 74 39 - 117 U/L   Total Bilirubin 0.7 0.3 - 1.2 mg/dL   GFR calc non Af Amer >90 >90 mL/min   GFR calc Af Amer >90 >90 mL/min    Comment: (NOTE) The eGFR has been calculated using the CKD EPI equation. This calculation has not been validated in all clinical situations. eGFR's persistently <90 mL/min signify possible Chronic Kidney Disease.    Anion gap 9 5 - 15  CBC     Status: Abnormal   Collection Time: 10/15/14  5:19 AM  Result Value Ref Range   WBC 4.8 4.0 - 10.5 K/uL   RBC 3.31 (L) 3.87 - 5.11 MIL/uL   Hemoglobin 9.9 (L) 12.0 - 15.0 g/dL   HCT 29.9 (L) 36.0 - 46.0 %   MCV 90.3 78.0 - 100.0 fL   MCH 29.9 26.0 - 34.0 pg   MCHC 33.1 30.0 - 36.0 g/dL   RDW 15.3 11.5 - 15.5 %   Platelets 119 (L) 150 - 400 K/uL    Comment: DELTA CHECK NOTED REPEATED TO VERIFY SPECIMEN CHECKED FOR CLOTS CONSISTENT WITH PREVIOUS RESULT   Glucose, capillary     Status: Abnormal   Collection Time: 10/15/14  7:23 AM  Result Value Ref Range   Glucose-Capillary 129 (H) 70 - 99 mg/dL    MICRO: 12/20 body fluid cx - ngtd 12/18 blood cx  X 2 ngtd IMAGING: Dg Pelvis Portable  10/14/2014   CLINICAL DATA:  Postop total hip replacement  EXAM: PORTABLE PELVIS 1-2 VIEWS  COMPARISON:  None.  FINDINGS: There is complicated left hip arthroplasty with antibiotic laden acetabular cup and antibiotic laden methylmethacrylate around the femoral stem component. There is no acute dislocation. There is a  fracture of the medial wall of the acetabulum. There are cerclage wires transfixing the mid femoral diaphysis incompletely included in the field of view. There are postsurgical changes in the soft tissues surrounding the right hip.  IMPRESSION: Complicate left hip arthroplasty with antibiotic laden acetabular cup antibiotic laden methylmethacrylate around the femoral stem component. No dislocation.  Stable left acetabular fracture.   Electronically Signed   By: Kathreen Devoid   On: 10/14/2014 12:27   Assessment/Plan:  59NI F with complicated surgical hx for a left hip MRSA PJI s/p antibiotic spacer and revision THA & ORIF of femur fx in Oct 2015 subsequently complicated by dislocation and failure requiring repeat excisional debridement and revision THA on 10/14/14.  Chronic PJI =  - she has 17 more days of IV antibiotics before switching to an oral regimen, will change her to ceftaroline (aka TEFLARO) 633m Q12 , as probably the best agent to finish out her course to take until November 01, 2014. This would be less expensive than daptomycin. She is unable to take linezolid due to her baseline thrombocytopenia. She is probably not a candidate for treatment doses of bactrim due to her leukopenia as well. Can consider oritavancin but often more costly than daptomycin.  - will check sed rate and crp today - will have her follow up in the ID clinic with Dr. CMegan Salonthe first week on January where then she would be transitioned to oral antibiotcs - continue with rifampin 3060mBID.  Thrombocytopenia = at her baseline.  Nausea = improved.  CyElzie RingsnCross [facility] [phone]

## 2014-10-15 NOTE — Progress Notes (Signed)
INITIAL NUTRITION ASSESSMENT  DOCUMENTATION CODES Per approved criteria  -Not Applicable   INTERVENTION: - Prostat TID - Multivitamin with minerals  NUTRITION DIAGNOSIS: Increased nutrient needs related to wound healing as evidenced by septic arthritis/hip infection.   Goal: Pt to meet >/= 90% of their estimated nutrition needs  Monitor:  Weight trend, po intake, acceptance of supplements, labs  Reason for Assessment: Consult for wound healing  58 y.o. female  Admitting Dx: Hip dislocation, left  ASSESSMENT: 58 y.o. female with past medical history for GERD, migraine headache depression, left hip joint infection, venous stasis dermatitis, who presents with worsening left hip pain.  S/p: 1. Repeat excisional debridement and non-excisional debridement of left hip infection. 2. Revision of left total hip arthroplasty.  - Pt reports that she has been eating normally. PO intake is varied from 15-100%. [facility] Pt with no recent weight loss. Will order Porstat and MVI to promote wound healing. No signs of fat or muscle wasting. Pt not ready for discharge today.  Labs: Na, K and BUN WNL Albumin low   Height: Ht Readings from Last 1 Encounters:  10/12/14 5\' 10"  (1.778 m)    Weight: Wt Readings from Last 1 Encounters:  10/12/14 241 lb (109.317 kg)    Ideal Body Weight: 68.5 kg  % Ideal Body Weight: 160%  Wt Readings from Last 10 Encounters:  10/12/14 241 lb (109.317 kg)  09/18/14 242 lb 8 oz (109.997 kg)  09/11/14 260 lb (117.935 kg)  08/24/14 239 lb (108.41 kg)  08/17/14 239 lb (108.41 kg)  07/12/14 239 lb (108.41 kg)    Usual Body Weight: 239-242 lbs  % Usual Body Weight: 100%  BMI:  Body mass index is 34.58 kg/(m^2).  Estimated Nutritional Needs: Kcal: 2200-2400 Protein: 140-150 g/day Fluid: 2.2-2.4 L/day  Skin: closed incision on left hip  Diet Order: Diet regular  EDUCATION NEEDS: -Education needs  addressed   Intake/Output Summary (Last 24 hours) at 10/15/14 1354 Last data filed at 10/15/14 1126  Gross per 24 hour  Intake   1760 ml  Output   1100 ml  Net    660 ml    Last BM: 12/21   Labs:   Recent Labs Lab 10/12/14 0430 10/14/14 0520 10/15/14 0519  NA 137 136* 137  K 4.3 4.4 4.6  CL 99 99 102  CO2 26 27 26   BUN 10 14 13   CREATININE 0.65 0.65 0.60  CALCIUM 9.5 9.3 8.6  GLUCOSE 99 109* 118*    CBG (last 3)   Recent Labs  10/13/14 0823 10/15/14 0723  GLUCAP 136* 129*    Scheduled Meds: . clotrimazole   Topical BID  . DAPTOmycin (CUBICIN)  IV  650 mg Intravenous Q24H  . darifenacin  15 mg Oral QHS  . docusate sodium  100 mg Oral BID  . DULoxetine  60 mg Oral Daily  . enoxaparin (LOVENOX) injection  40 mg Subcutaneous Q24H  . esomeprazole  40 mg Oral Daily  . ferrous sulfate  325 mg Oral BH-q7a  . gabapentin  400 mg Oral 3 times per day  . gabapentin  800 mg Oral 2 times per day  . Naloxegol Oxalate  25 mg Oral QHS  . oxymorphone  20 mg Oral 3 times per day  . polyethylene glycol  17 g Oral BID  . rifampin  300 mg Oral BID  . saccharomyces boulardii  250 mg Oral BID  . traZODone  150-300 mg Oral  QHS  . triamcinolone cream  1 application Topical BID    Continuous Infusions: . lactated ringers    . sodium chloride 0.9 % 1,000 mL with potassium chloride 10 mEq infusion 75 mL/hr at 10/14/14 1505    Past Medical History  Diagnosis Date  . Tenosynovitis of fingers     RIGHT RING AND SMALL-HAD SURGERY 07/12/14 [facility]  . Bronchitis     dx  07-04-2014 (approx) will finished antibiotic 07-14-2014  . History of concussion     x4  --  no residual  . Migraines   . Depression   . 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 GERD (gastroesophageal reflux disease)   . H/O hiatal hernia   . Chronic constipation   . Chronic narcotic use   . Nocturia   .  SUI (stress urinary incontinence, female)   . OA (osteoarthritis)     hips, knees, hips  . Infection of left prosthetic hip joint     left total arthroplasty done 2012/  jan 2015 removal of prosthesis  . Full dentures   . History of cellulitis     lower extremities  . Thinning of skin     easily tears  . Venous stasis dermatitis     BILATERAL LEGS  . Productive cough     RESOLVED - QUIT SMOKING  . Memory problem   . OSA on CPAP     study x3   last one few yrs ago  cpap recommended -- DOES NOT USE OR HAVE CPAP    Past Surgical History  Procedure Laterality Date  . Appendectomy  [id number] (age 58)  . 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 Orif left foot fx  1990's  . Orif right tibial fx  2008    hardware removed same year  . Open repair and fixation right tibia and knee  X2  2009  . Total knee arthroplasty Right X2  2009  &  2010  . Total knee arthroplasty Left 2009  . Knee arthroscopy Left 2009  . Orif pelvic fx's  11 /2011  . Total hip arthroplasty Left 2012  . Revision total hip arthroplasty Left 01/ 2015  . Removal prothesis  left hip arthroplasty  02/ 2015    INFECTION  . Laparoscopic cholecystectomy  08-12-2003  . Tenosynovectomy Right 07/12/2014    Procedure: RIGHT RING/SMALL FINGER TENOSYNOVECTOMY;  Surgeon: Sharma CovertFred W Ortmann, MD;  [facility];  Service: Orthopedics;  Laterality: Right;  . Total hip revision Left 08/24/2014    Procedure: INCISION AND DRAINAGE LEFT HIP, EXCHANGE ANTIBIOTIC SPACER, OPEN REDUCTION LEFT FEMUR FRACTURE ;  Surgeon: Loanne DrillingFrank Aluisio V, MD;  Location: [facility];  Service: Orthopedics;  Laterality: Left;    Emmaline KluverHaley Luara Faye MS, RD, LDN

## 2014-10-15 NOTE — Clinical Social Work Psychosocial (Signed)
Clinical Social Work Department BRIEF PSYCHOSOCIAL ASSESSMENT 10/15/2014  Patient:  [facility]     Account Number:  [id number]     Admit date:  10/11/2014  Clinical Social Worker:  Daiva Huge  Date/Time:  10/15/2014 03:12 PM  Referred by:  Physician  Date Referred:  10/15/2014 Referred for  SNF Placement   Other Referral:   Interview type:  Patient Other interview type:   Met with patient and her sister at bedside    PSYCHOSOCIAL DATA Living Status:  FAMILY Admitted from facility:   Level of care:   Primary support name:  husband, sister Primary support relationship to patient:  FAMILY Degree of support available:   good    CURRENT CONCERNS Current Concerns  Post-Acute Placement   Other Concerns:    SOCIAL WORK ASSESSMENT / PLAN Patient reports an interest in going to SNF if we can find her a "decent one".  She has been at home but feels SNF F may be advantageous given her needs at this time. She lives with her husband in [place] and is open to SNF search in the [place] counties for options.   Assessment/plan status:  Other - See comment Other assessment/ plan:  FL2 and PASARR for SNF search Information/referral to community resources:   SNF List  PATIENT'S/FAMILY'S RESPONSE TO PLAN OF CARE: Patient reports going to SNF in the past and is only willing to consider "4 star or better" options. I have advised her of our SNF search protocal and will update her as SNF options are rec'd for selection- she also reports, "I can go home too if I need to".  SNF search underway- will Darel Hong, MSW, [place]

## 2014-10-15 NOTE — Evaluation (Signed)
Physical Therapy Evaluation Patient Details Name: [name] MRN: [id number] DOB: [birth year] Today's Date: 10/15/2014   History of Present Illness  Antibiotic spacer dislocation.  I&D and replacement of spacer 10/14/14. Antibiotic spacer L hip and ORIF L femur s/p spiral fx 08/24/14;    Clinical Impression  Pt s/p I&D and replacement antibiotic spacer L hip following dislocation and presents with decreased L LE strength/ROM, post op pain, PWB on L and posterior THP limiting functional mobility.  Pt would benefit from follow up rehab at SNF level to maximize IND and safety prior to return home with ltd assist.    Follow Up Recommendations SNF    Equipment Recommendations  None recommended by PT    Recommendations for Other Services OT consult     Precautions / Restrictions Precautions Precautions: Posterior Hip;Fall Restrictions Weight Bearing Restrictions: Yes LLE Weight Bearing: Partial weight bearing LLE Partial Weight Bearing Percentage or Pounds: 50%      Mobility  Bed Mobility Overal bed mobility: Needs Assistance Bed Mobility: Sit to Supine       Sit to supine: Min assist;Mod assist;+2 for physical assistance   General bed mobility comments: cues for sequence and assist with bilat LEs  Transfers Overall transfer level: Needs assistance Equipment used: Rolling walker (2 wheeled) Transfers: Sit to/from Stand Sit to Stand: Min assist            Ambulation/Gait Ambulation/Gait assistance: Min assist Ambulation Distance (Feet): 2 Feet Assistive device: Rolling walker (2 wheeled) Gait Pattern/deviations: Step-to pattern;Decreased step length - right;Decreased step length - left;Shuffle;Trunk flexed Gait velocity: decr   General Gait Details: to side step up bed [facility]  [facility]irs            Wheelchair Mobility    Modified Rankin (Stroke Patients Only)       Balance                                              Pertinent Vitals/Pain Pain Assessment: 0-10 Pain Score: 7  Pain Location: L hip and thigh Pain Descriptors / Indicators: Aching;Sore Pain Intervention(s): Limited activity within patient's tolerance;Monitored during session;Premedicated before session (pt delines ice)    Home Living Family/patient expects to be discharged to:: Skilled nursing facility                      Prior Function Level of Independence: Needs assistance   Gait / Transfers Assistance Needed: used power w/c for most mobility           Hand Dominance   Dominant Hand: Right    Extremity/Trunk Assessment   Upper Extremity Assessment: Overall WFL for tasks assessed           Lower Extremity Assessment: RLE deficits/detail;LLE deficits/detail RLE Deficits / Details: Generalized weakness LLE Deficits / Details: 2/5 hip strength with AAROM at hip to 70 flex and 10 abd  Cervical / Trunk Assessment: Normal  Communication   Communication: No difficulties  Cognition Arousal/Alertness: Awake/alert Behavior During Therapy: WFL for tasks assessed/performed Overall Cognitive Status: Within Functional Limits for tasks assessed                      General Comments      Exercises Total Joint Exercises Ankle Circles/Pumps: AROM;Both;15 reps;Supine Quad Sets: AROM;Both;10 reps;Supine Gluteal Sets: AROM;Both;10 reps;Supine  Heel Slides: AAROM;Left;15 reps;Supine Hip ABduction/ADduction: AAROM;Left;15 reps;Supine      Assessment/Plan    PT Assessment Patient needs continued PT services  PT Diagnosis Difficulty walking   PT Problem List Decreased strength;Decreased range of motion;Decreased activity tolerance;Decreased mobility;Decreased knowledge of use of DME;Pain;Obesity;Decreased knowledge of precautions  PT Treatment Interventions DME instruction;Gait training;Functional mobility training;Therapeutic activities;Therapeutic exercise;Patient/family education   PT Goals  (Current goals can be found in the Care Plan section) Acute Rehab PT Goals Patient Stated Goal: My leg to heal and be able to walk again PT Goal Formulation: With patient Time For Goal Achievement: 10/29/14 Potential to Achieve Goals: Good    Frequency 7X/week   Barriers to discharge Decreased caregiver support      Co-evaluation               End of Session   Activity Tolerance: Patient tolerated treatment well;Patient limited by pain Patient left: in bed;with call bell/phone within reach Nurse Communication: Mobility status         Time: [id number] PT Time Calculation (min) (ACUTE ONLY): 36 min   Charges:   PT Evaluation $Initial PT Evaluation Tier I: 1 Procedure PT Treatments $Therapeutic Exercise: 8-22 mins $Therapeutic Activity: 8-22 mins   PT G Codes:          [name] 10/15/2014, 12:54 PM

## 2014-10-15 NOTE — Progress Notes (Signed)
Patient ID: [name], female   DOB: [birth year], 58 y.o.   MRN: [id number] Subjective: 1 Day Post-Op Procedure(s) (LRB): PARTIAL HIP REVISION (Left)    Patient reports pain as moderate.  Doing ok this am, angry with her husband she said which distracts her from her pain  Objective:   VITALS:   Filed Vitals:   10/15/14 0800  BP:   Pulse:   Temp:   Resp: 16    Neurovascular intact Incision: dressing C/D/I  LABS  Recent Labs  10/14/14 0520 10/15/14 0519  HGB 10.5* 9.9*  HCT 32.1* 29.9*  WBC 2.1* 4.8  PLT 88* 119*     Recent Labs  10/14/14 0520 10/15/14 0519  NA 136* 137  K 4.4 4.6  BUN 14 13  CREATININE 0.65 0.60  GLUCOSE 109* 118*    No results for input(s): LABPT, INR in the last 72 hours.   Assessment/Plan: 1 Day Post-Op Procedure(s) (LRB): PARTIAL HIP REVISION (Left)   Advance diet Up with therapy Discharge to SNF   Consulted ID for treatment recs and guidance, explained how treatment effected socioeconomic family situation  Will be following

## 2014-10-16 ENCOUNTER — Telehealth: Payer: Self-pay | Admitting: Infectious Diseases

## 2014-10-16 ENCOUNTER — Encounter (HOSPITAL_COMMUNITY): Payer: Self-pay | Admitting: Orthopedic Surgery

## 2014-10-16 LAB — MRSA CULTURE: Culture: NO GROWTH

## 2014-10-16 LAB — SEDIMENTATION RATE: SED RATE: 74 mm/h — AB (ref 0–22)

## 2014-10-16 LAB — VITAMIN D 25 HYDROXY (VIT D DEFICIENCY, FRACTURES): Vit D, 25-Hydroxy: 23 ng/mL — ABNORMAL LOW (ref 30–100)

## 2014-10-16 LAB — CBC
HCT: 23.2 % — ABNORMAL LOW (ref 36.0–46.0)
HEMOGLOBIN: 7.9 g/dL — AB (ref 12.0–15.0)
MCH: 29.8 pg (ref 26.0–34.0)
MCHC: 34.1 g/dL (ref 30.0–36.0)
MCV: 87.5 fL (ref 78.0–100.0)
Platelets: 83 10*3/uL — ABNORMAL LOW (ref 150–400)
RBC: 2.65 MIL/uL — ABNORMAL LOW (ref 3.87–5.11)
RDW: 15.1 % (ref 11.5–15.5)
WBC: 3 10*3/uL — AB (ref 4.0–10.5)

## 2014-10-16 LAB — C-REACTIVE PROTEIN: CRP: 5.7 mg/dL — AB (ref <0.60)

## 2014-10-16 MED ORDER — OXYCODONE HCL 10 MG PO TABS: 10.0000 mg | ORAL_TABLET | ORAL | Status: DC | PRN

## 2014-10-16 MED ORDER — CHOLECALCIFEROL 10 MCG (400 UNIT) PO TABS
800.0000 [IU] | ORAL_TABLET | Freq: Every day | ORAL | Status: DC
  Administered 2014-10-16: 800 [IU] via ORAL
  Filled 2014-10-16: qty 2

## 2014-10-16 MED ORDER — VITAMIN D 400 UNITS PO TABS: 800.0000 [IU] | ORAL_TABLET | Freq: Every day | ORAL | Status: DC

## 2014-10-16 MED ORDER — SODIUM CHLORIDE 0.9 % IV SOLN: 600.0000 mg | Freq: Two times a day (BID) | INTRAVENOUS | Status: DC

## 2014-10-16 MED ORDER — GABAPENTIN 400 MG PO CAPS: 800.0000 mg | ORAL_CAPSULE | Freq: Two times a day (BID) | ORAL | Status: AC

## 2014-10-16 MED ORDER — ALUM & MAG HYDROXIDE-SIMETH 200-200-20 MG/5ML PO SUSP: 30.0000 mL | ORAL | Status: DC | PRN

## 2014-10-16 MED ORDER — PRO-STAT SUGAR FREE PO LIQD: 30.0000 mL | Freq: Three times a day (TID) | ORAL | Status: DC

## 2014-10-16 MED ORDER — GABAPENTIN 400 MG PO CAPS
400.0000 mg | ORAL_CAPSULE | Freq: Three times a day (TID) | ORAL | Status: DC
Start: 2014-10-16 — End: 2014-10-29

## 2014-10-16 MED ORDER — OXYMORPHONE HCL ER 20 MG PO TB12: 20.0000 mg | ORAL_TABLET | Freq: Three times a day (TID) | ORAL | Status: DC

## 2014-10-16 NOTE — Progress Notes (Signed)
10/16/14 1630  [phone]

## 2014-10-16 NOTE — Telephone Encounter (Signed)
Got a call from pt's SNF that her IV anbx were not affordable.  I asked that they speak with the ID MD who d/c her to clarify this in the AM

## 2014-10-16 NOTE — Discharge Instructions (Signed)
Septic Arthritis Septic arthritis is a germ (bacterial) infection of a joint. The most common germ that causes this problem is Staphylococcus aureus. Other common germs include Haemophilus influenzae and Group B streptococcus (common in toddlers). In septic arthritis, joint damage results from enzymes produced by the germs and white blood cells (leukocytes). White blood cells are the blood cells that work to fight infection. Blood vessel (secondary vascular) damage may occur from clots in the vessels (thrombosis) or direct compression of vessels.  Septic arthritis usually occurs suddenly with rapid onset of joint pain progressing to a fever and an illness that affects the whole body. On examination, the infected joint is:  Swollen.  Tender.  Painful.  Has a limited range of movement. Your caregiver may use blood tests to help make the diagnosis. The results of these tests will show higher than normal:  White blood count (WBC).  C-reactive protein (CRP).  Erythrocyte sedimentation rate (ESR). An infected joint in a newborn is often hard to detect. The newborn may show loss of movement of an arm or leg or hold the joint in one position. Often newborns do not appear as sick as an older child or adult might. Fever may be absent and laboratory work may be normal. There may be a history of mild injury to the extremity, a history of illness, or an infection. Septic arthritis may follow chickenpox. Dislocation of the hip joint (the hip comes out of joint) may occur along with fluid buildup (effusion) in the joint and infection that may result in permanent damage to the hip.  Early X-rays are often normal except for some swelling of soft tissue around the joint.  Widening of the joint space may develop over time.  With time, X-rays may demonstrate bone loss or thinning of the bones due to non-use or increased blood supply in the area.  X-rays may show a joint effusion.  Bone scintigraphy is a  specialized x-ray that measures the uptake of an injected radioactive material in an area of inflammation (soreness). This test allows earlier diagnosis, showing localized increased uptake of radioactivity around the infected joint.  Ultrasound shows the joint effusion.  Blood testing (culture) may be helpful, but removing fluid from the joint (aspiration) confirms the diagnosis of septic arthritis. The joint fluid will be cloudy (usually joint fluid is clear) with a high white blood cell count and bacteria in the fluid. Blood testing may identify the bacteria causing the infection, but it is not uncommon for blood tests to show up negative even when there is an infection. TREATMENT  Treatment includes medications that kill germs (antibiotics) given through the vein (intravenously) and draining of the infected joint. HOME CARE INSTRUCTIONS   Because of the seriousness of an infected joint, this problem is often cared for in the hospital.  Take all medications as prescribed by your caregiver.If circumstances have forced care at home or your caregiver feels home care will be safe, take all medications as ordered. Do not stop taking medications if the problem seems better after a couple of days.  Take you or your child's temperature 2 times per day and record. Bring this information with you to your caregiver.  You must keep all of your follow-up appointments. Not keeping the appointment could result in a chronic or permanent injury, pain, disability, or even death. If there is any problem keeping the appointment, you must call back to this facility for assistance. An infected joint is a serious problem. Even with the  best of care, it can result in life-long disability. Follow all directions! Document Released: 01/02/2003 Document Revised: 01/04/2012 Document Reviewed: 12/15/2013 [facility] Patient Information 2015 [place], [place]. This information is not intended to replace advice given to you by your  health care provider. Make sure you discuss any questions you have with your health care provider.

## 2014-10-16 NOTE — Progress Notes (Signed)
Patient ID: [name], female   DOB: May 17, 1956, 58 y.o.   MRN: [id number] Subjective: 2 Days Post-Op Procedure(s) (LRB): PARTIAL HIP REVISION (Left)    Patient reports pain as moderate.  Doing OK given her situtation  Objective:   VITALS:   Filed Vitals:   10/16/14 0444  BP: 136/64  Pulse: 106  Temp: 98.5 F (36.9 C)  Resp: 16    Neurovascular intact Incision: dressing C/D/I  LABS  Recent Labs  10/14/14 0520 10/15/14 0519 10/16/14 0620  HGB 10.5* 9.9* 7.9*  HCT 32.1* 29.9* 23.2*  WBC 2.1* 4.8 3.0*  PLT 88* 119* 83*     Recent Labs  10/14/14 0520 10/15/14 0519  NA 136* 137  K 4.4 4.6  BUN 14 13  CREATININE 0.65 0.60  GLUCOSE 109* 118*    No results for input(s): LABPT, INR in the last 72 hours.   Assessment/Plan: 2 Days Post-Op Procedure(s) (LRB): PARTIAL HIP REVISION (Left)   Advance diet Up with therapy Discharge to SNF today if arrangements made

## 2014-10-16 NOTE — Clinical Social Work Maternal (Signed)
[facility]. Husband and patient agreeable to this plan- plan transfer via EMS. Reece LevyJanet Alysen Smylie, MSW, Theresia MajorsLCSWA [phone]

## 2014-10-16 NOTE — Discharge Summary (Signed)
Physician Discharge Summary  [name] JustDeborah Giammona ZOX:[id number] DOB: May 28, 1956 DOA: 10/11/2014  PCP: Carolynn ServeHINSON,JOHN, MD  Admit date: 10/11/2014 Discharge date: 10/16/2014  Time spent: 45 minutes  Recommendations for Outpatient Follow-up:  Patient will be discharged to a skilled nursing facility for rehabilitation. She is to continue physical as well as occupational therapy as recommended by the facility. Patient will need to follow-up with orthopedics within 2 weeks of discharge. She should also follow-up with Dr. Orvan Falconerampbell for her scheduled appointment in January 2016. Patient should continue her medications as prescribed. Patient should follow a regular diet. As discussed at length, patient should also follow up with outpatient neurology for possible nerve conduction studies and a pain management physician as well.  Discharge Diagnoses:  Left hip pain/hip dislocation/left septic arthritis of the hip Osteoarthritis Depression Obstructive sleep apnea GERD Pancytopenia Neuropathy  Discharge Condition: Stable  Diet recommendation:  Regular  Filed Weights   10/12/14 1238  Weight: 109.317 kg (241 lb)    History of present illness:  on 10/12/2014 by Dr. Lorretta HarpXilin Niu  [name] is a 58 y.o. female with past medical history for GERD, migraine headache depression, left hip joint infection, venous stasis dermatitis, who presents with worsening left hip pain.  Patient reports that she had left hip arthroplasty in Feb 2012 and In Jan 2015 she had the arthoplasty replaced. She then developed an infection in the left hip which required surgical debridement in Feb 2015. After that, she received several months of IV abx with transition to Oral abx. She then transferred care to Dr. Despina HickAlusio on Oct 31st. At that time her left hip continued to be infected. She had abx spacers in place. She is currently receiving daptomycin and rifampin. In the past 2 weeks, her left hip pain has been progressively  getting worse. She can not bear weight on left leg. She did not have new injury. [facility] and had an x-ray, which showed left hip joint dislocation. She comes to the emergency room for further evaluation and treatment. Patient denies fever, chills, headache, neck pain, chest pain, shortness of breath, abdominal pain, nausea, vomiting, diarrhea, weakness, dizziness, syncope. Work up in the ED demonstrates dislocation of left hip joint by x-ray. Neutropenia with WBC 2.1. Patient is admitted to inpatient for further evaluation and treatment. Orthopedic surgeon was consulted.  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 Orvan Falconerampbell, at discharge -Dr. Drue SecondSnider (ID) consulted for switch in medications as daptomycin is expensive, patient started on Ceftaroline -PT/OT consulted and pending evaluation -Social work consulted for possible placement -Continue ceftaroline and rifampin through November 01, 2014 -Followup with orthopedic surgery in 2 weeks of discharge  Osteoarthritis with Vitamin D Deficiency -Patient will likely need bone density scan upon discharge -Vitamin D level 23 -Will be started on Vit D3 supplementation, 800units daily -Should be followed by her PCP  Depression -Continue Cymbalta  Obstructive sleep apnea -Continue CPAP daily at bedtime  GERD -Continue PPI  Pancytopenia -Likely secondary to chronic infection, labs appear stable compared to previous admissions -Patient's hemoglobin does appear to be at baseline as compared to previous admissions also could be partially secondary to blood loss from surgery  Neuropathy -Increased gabapentin from 2400mg  daily to 2800mg  daily. Patient has tried lyrica in the  past -Encouraged patient to seek EMG  or nerve conduction studies as an outpatient -Patient states she would like neurology to see her while she is in the hospital -Spoke with Dr. Vonita Moss, neurologist, via phone who also recommended outpatient neuro follow up and even pain management. No further recommendations or testing.   Procedures: Partial hip revision, repeat excisional debridement  Consultations: Orthopedic surgery Neurology, Dr. Roseanne Reno, via phone Dr. Drue Second, infectious disease  Discharge Exam: Filed Vitals:   10/16/14 0444  BP: 136/64  Pulse: 106  Temp: 98.5 F (36.9 C)  Resp: 16     General: Well developed, well nourished, NAD, appears stated age  HEENT: NCAT, mucous membranes moist.  Cardiovascular: S1 S2 auscultated, RRR  Respiratory: Clear to auscultation bilaterally with equal chest rise  Abdomen: Soft, nontender, nondistended, + bowel sounds  Extremities: warm dry without cyanosis clubbing or edema  Neuro: AAOx3, no focal deficits  Psych: Normal affect and demeanor with intact judgement and insight  Discharge Instructions      Discharge Instructions    Discharge instructions    Complete by:  As directed   Patient will be discharged to a skilled nursing facility for rehabilitation. She is to continue physical as well as occupational therapy as recommended by the facility. Patient will need to follow-up with orthopedics within 2 weeks of discharge. She should also follow-up with Dr. Orvan Falconer for her scheduled appointment in January 2016. Patient should continue her medications as prescribed. Patient should follow a regular diet. As discussed at length, patient should also follow up with outpatient neurology for possible nerve conduction studies and a pain management physician as well.            Medication List    STOP taking these medications        DAPTOmycin 650 mg in sodium chloride 0.9 % 100 mL     meloxicam 7.5 MG tablet  Commonly known as:   MOBIC      TAKE these medications        acetaminophen 325 MG tablet  Commonly known as:  TYLENOL  Take 2 tablets (650 mg total) by mouth every 6 (six) hours as needed for mild pain (or Fever >/= 101).     alum & mag hydroxide-simeth 200-200-20 MG/5ML suspension  Commonly known as:  MAALOX/MYLANTA  Take 30 mLs by mouth every 4 (four) hours as needed for indigestion.     aspirin 325 MG tablet  Take 325 mg by mouth daily.     bisacodyl 10 MG suppository  Commonly known as:  DULCOLAX  Place 1 suppository (10 mg total) rectally daily as needed for moderate constipation.     ceftaroline 600 mg in sodium chloride 0.9 % 250 mL  Inject 600 mg into the vein every 12 (twelve) hours.     clotrimazole-betamethasone cream  Commonly known as:  LOTRISONE  Apply 1 application topically 2 (two) times daily as needed (rash in skin folds).     CYMBALTA 60 MG capsule  Generic drug:  DULoxetine  Take 60 mg by mouth daily.     docusate sodium 100 MG capsule  Commonly known as:  COLACE  Take 100-200 mg by mouth at bedtime as needed for mild constipation.     feeding supplement (PRO-STAT SUGAR FREE 64) Liqd  Take 30 mLs by mouth 3 (three) times daily with meals.     furosemide 40 MG tablet  Commonly known as:  LASIX  Take 40 mg by mouth 2 (two) times daily as needed (fluid retention).  gabapentin 400 MG capsule  Commonly known as:  NEURONTIN  Take 2 capsules (800 mg total) by mouth 2 (two) times daily.     gabapentin 400 MG capsule  Commonly known as:  NEURONTIN  Take 1 capsule (400 mg total) by mouth 3 (three) times daily.     hydrOXYzine 25 MG tablet  Commonly known as:  ATARAX/VISTARIL  Take 25 mg by mouth every 6 (six) hours as needed for anxiety (anxiety).     IRON PO  Take 1 tablet by mouth every morning.     methocarbamol 500 MG tablet  Commonly known as:  ROBAXIN  Take 1 tablet (500 mg total) by mouth every 6 (six) hours as needed for muscle spasms.     metoCLOPramide  5 MG tablet  Commonly known as:  REGLAN  Take 1-2 tablets (5-10 mg total) by mouth every 8 (eight) hours as needed for nausea (if ondansetron (ZOFRAN) ineffective.).     MOVANTIK PO  Take 25 mg by mouth at bedtime.     multivitamin tablet  Take 1 tablet by mouth at bedtime.     NEXIUM 40 MG capsule  Generic drug:  esomeprazole  Take 40 mg by mouth.     nicotine polacrilex 4 MG lozenge  Commonly known as:  COMMIT  Take 4 mg by mouth as needed for smoking cessation (smoking cessation).     ondansetron 4 MG tablet  Commonly known as:  ZOFRAN  Take 1 tablet (4 mg total) by mouth every 6 (six) hours as needed for nausea.     Oxycodone HCl 10 MG Tabs  Take 1-2 tablets (10-20 mg total) by mouth every 3 (three) hours as needed.     oxymorphone 20 MG 12 hr tablet  Commonly known as:  OPANA ER  Take 1 tablet (20 mg total) by mouth 3 (three) times daily.     rifampin 300 MG capsule  Commonly known as:  RIFADIN  Take 1 capsule (300 mg total) by mouth 2 (two) times daily. DO NOT START UNTIL Tuesday  09/18/2014 WHEN THE PATIENT COMES OFF THE XARELTO.     sodium chloride 0.65 % Soln nasal spray  Commonly known as:  OCEAN  Place 2 sprays into both nostrils daily as needed for congestion.     solifenacin 10 MG tablet  Commonly known as:  VESICARE  Take 10 mg by mouth at bedtime.     SUMAtriptan 100 MG tablet  Commonly known as:  IMITREX  Take 100 mg by mouth once. May repeat in 2 hours if headache persists or recurs. NO MORE THAN 2 IN A 24 HOUR PERIOD     traZODone 150 MG tablet  Commonly known as:  DESYREL  Take 150-300 mg by mouth at bedtime.     triamcinolone cream 0.1 %  Commonly known as:  KENALOG  Apply 1 application topically 2 (two) times daily. CALF OF BOTH LEGS - BECAUSE OF SKIN IRRITATION THOUGHT TO BE DUE TO SWELLING OF LEGS     vitamin D (CHOLECALCIFEROL) 400 UNITS tablet  Take 2 tablets (800 Units total) by mouth daily.       Allergies  Allergen Reactions  .  Vancomycin Other (See Comments)    LEUKOPENIA   Follow-up Information    Follow up with HINSON,JOHN, MD. Schedule an appointment as soon as possible for a visit in 1 week.   Specialty:  Family Medicine   Why:  Hospital followup   Contact information:   [address] [place] [address] [phone]        The results of significant diagnostics from this hospitalization (including imaging, microbiology, ancillary and laboratory) are listed below for reference.    Significant Diagnostic Studies: Dg Hip Complete Left  10/12/2014   CLINICAL DATA:  Left hip pain.  EXAM: LEFT HIP - COMPLETE 2+ VIEW  COMPARISON:  08/29/2014  FINDINGS: There is a left hip prosthesis with antibiotic spacer. The prosthesis is dislocated superiorly.  Remote fracture of the left pelvis traversing the roof of the acetabulum, which is distorted.  Remote periprosthetic fracture through the left femoral diaphysis, with plate and cerclage wire fixation. The fracture is still visible, but without interval displacement.  Negative right hip.  IMPRESSION: 1.  Dislocated prosthetic left hip, including antibiotic spacer. 2. Unchanged appearance of treated periprosthetic fracture in the left femoral diaphysis. 3. Unchanged appearance of remote left acetabular fracture.   Electronically Signed   By: Tiburcio Pea M.D.   On: 10/12/2014 00:33   Dg Pelvis Portable  10/14/2014   CLINICAL DATA:  Postop total hip replacement  EXAM: PORTABLE PELVIS 1-2 VIEWS  COMPARISON:  None.  FINDINGS: There is complicated left hip arthroplasty with antibiotic laden acetabular cup and antibiotic laden methylmethacrylate around the femoral stem component. There is no acute dislocation. There is a fracture of the medial wall of the acetabulum. There are cerclage wires transfixing the mid femoral diaphysis incompletely included in the field of view. There are postsurgical changes in the soft tissues surrounding the right hip.  IMPRESSION:  Complicate left hip arthroplasty with antibiotic laden acetabular cup antibiotic laden methylmethacrylate around the femoral stem component. No dislocation.  Stable left acetabular fracture.   Electronically Signed   By: Elige Ko   On: 10/14/2014 12:27    Microbiology: Recent Results (from the past 240 hour(s))  Culture, blood (routine x 2)     Status: None (Preliminary result)   Collection Time: 10/12/14  4:30 AM  Result Value Ref Range Status   Specimen Description BLOOD LEFT ANTECUBITAL  Final   Special Requests BOTTLES DRAWN AEROBIC ONLY  Final   Culture  Setup Time   Final    10/12/2014 08:49 Performed at [facility]    Culture   Final           BLOOD CULTURE RECEIVED NO GROWTH TO DATE CULTURE WILL BE HELD FOR 5 DAYS BEFORE ISSUING A FINAL NEGATIVE REPORT Performed at [facility]    Report Status PENDING  Incomplete  Culture, blood (routine x 2)     Status: None (Preliminary result)   Collection Time: 10/12/14  4:36 AM  Result Value Ref Range Status   Specimen Description BLOOD LEFT HAND  Final   Special Requests BOTTLES DRAWN AEROBIC ONLY  Final   Culture  Setup Time   Final    10/12/2014 08:48 Performed at [facility]    Culture   Final           BLOOD CULTURE RECEIVED NO GROWTH TO DATE CULTURE WILL BE HELD FOR 5 DAYS BEFORE ISSUING A FINAL NEGATIVE REPORT Performed at [facility]    Report Status PENDING  Incomplete  Surgical PCR screen     Status: Abnormal   Collection Time: 10/13/14  8:44 PM  Result Value Ref Range Status   MRSA, PCR INVALID RESULTS, SPECIMEN SENT FOR CULTURE (A) NEGATIVE Final    Comment: CRUTCHFIELD,J RN [phone] COVINGTON,N  Staphylococcus aureus INVALID RESULTS, SPECIMEN SENT FOR CULTURE (A) NEGATIVE Final    Comment: SPOKE WITH CRUTHFIEND,J RN [phone] COVINGTON,N        The Xpert SA Assay (FDA approved for NASAL specimens in patients over 5 years of age), is one component of a  comprehensive surveillance program.  Test performance has been validated by [employer] for patients greater than or equal to 89 year old. It is not intended to diagnose infection nor to guide or monitor treatment.   MRSA culture     Status: None   Collection Time: 10/14/14  3:27 AM  Result Value Ref Range Status   Specimen Description NASOPHARYNGEAL  Final   Special Requests NONE  Final   Culture   Final    NO GROWTH 2 DAYS Note: NOMRSA Performed at [facility]    Report Status 10/16/2014 FINAL  Final  Anaerobic culture     Status: None (Preliminary result)   Collection Time: 10/14/14 10:17 AM  Result Value Ref Range Status   Specimen Description WOUND LEFT HIP FLUID  Final   Special Requests PATIENT ON FOLLOWING ANCEF, DAPTOMYCIN, RIFAMPIN  Final   Gram Stain   Final    RARE WBC PRESENT, PREDOMINANTLY PMN NO SQUAMOUS EPITHELIAL CELLS SEEN NO ORGANISMS SEEN Performed at [facility]    Culture   Final    NO ANAEROBES ISOLATED; CULTURE IN PROGRESS FOR 5 DAYS Performed at [facility]    Report Status PENDING  Incomplete  Body fluid culture     Status: None (Preliminary result)   Collection Time: 10/14/14 10:17 AM  Result Value Ref Range Status   Specimen Description WOUND LEFT HIP FLUID  Final   Special Requests PATIENT ON FOLLOWING ANCEF,DAPTOMYCIN, RIFAMPIN  Final   Gram Stain   Final    FEW WBC PRESENT, PREDOMINANTLY PMN NO ORGANISMS SEEN Performed at [facility]    Culture   Final    NO GROWTH 1 DAY Performed at [facility]    Report Status PENDING  Incomplete     Labs: Basic Metabolic Panel:  Recent Labs Lab 10/11/14 2254 10/12/14 0430 10/14/14 0520 10/15/14 0519  NA 137 137 136* 137  K 4.5 4.3 4.4 4.6  CL 100 99 99 102  CO2 26 26 27 26   GLUCOSE 100* 99 109* 118*  BUN 11 10 14 13   CREATININE 0.65 0.65 0.65 0.60  CALCIUM 9.4 9.5 9.3 8.6   Liver Function Tests:  Recent Labs Lab 10/12/14 0430  10/15/14 0519  AST 25 24  ALT 11 11  ALKPHOS 104 74  BILITOT 0.3 0.7  PROT 7.3 5.7*  ALBUMIN 3.4* 2.6*   No results for input(s): LIPASE, AMYLASE in the last 168 hours. No results for input(s): AMMONIA in the last 168 hours. CBC:  Recent Labs Lab 10/11/14 2254 10/12/14 0430 10/14/14 0520 10/15/14 0519 10/16/14 0620  WBC 2.1* 2.3* 2.1* 4.8 3.0*  HGB 10.7* 10.7* 10.5* 9.9* 7.9*  HCT 32.5* 32.1* 32.1* 29.9* 23.2*  MCV 90.5 89.7 91.5 90.3 87.5  PLT 97* 90* 88* 119* 83*   Cardiac Enzymes:  Recent Labs Lab 10/15/14 0519  CKTOTAL 112   BNP: BNP (last 3 results) No results for input(s): PROBNP in the last 8760 hours. CBG:  Recent Labs Lab 10/13/14 0823 10/15/14 0723  GLUCAP 136* 129*       Signed:  Edsel Petrin  [facility] 10/16/2014, 10:30 AM

## 2014-10-17 ENCOUNTER — Encounter: Payer: Self-pay | Admitting: Internal Medicine

## 2014-10-17 ENCOUNTER — Telehealth: Payer: Self-pay | Admitting: Internal Medicine

## 2014-10-17 LAB — BODY FLUID CULTURE: Culture: NO GROWTH

## 2014-10-17 NOTE — Telephone Encounter (Signed)
Spoke to RN but they were unclear if teflaro was too expensive for the SNF. I gave them my number to call to discuss further options.

## 2014-10-17 NOTE — Telephone Encounter (Signed)
i will check into it

## 2014-10-18 LAB — CULTURE, BLOOD (ROUTINE X 2)
CULTURE: NO GROWTH
Culture: NO GROWTH

## 2014-10-19 LAB — ANAEROBIC CULTURE

## 2014-10-22 ENCOUNTER — Other Ambulatory Visit: Payer: Self-pay

## 2014-10-22 MED ORDER — OXYMORPHONE HCL ER 20 MG PO TB12
20.0000 mg | ORAL_TABLET | Freq: Three times a day (TID) | ORAL | Status: DC
Start: 2014-10-22 — End: 2014-11-19

## 2014-10-22 NOTE — Telephone Encounter (Signed)
RX sent to [facility] @ [phone]. Phone number [phone]  

## 2014-10-23 ENCOUNTER — Telehealth: Payer: Self-pay | Admitting: *Deleted

## 2014-10-23 DIAGNOSIS — M009 Pyogenic arthritis, unspecified: Secondary | ICD-10-CM

## 2014-10-23 NOTE — Telephone Encounter (Signed)
Patient called and advised that she is in a facility at the moment and as of Friday 10/26/14 she will have to leave as she will have to pay daily and she can not afford to do that. She also advised she had labs 10/22/14 and wanted to know what her results are and see if she could get oral antibiotics and be discharged home. She became weepy and advised she is very afraid and just want help. Advised her will call the facility and get it the labs faxed and ask the doctor know what is happening but he is not scheduled to be in the office until 10/29/14 but will see what we can do.

## 2014-10-24 NOTE — Telephone Encounter (Signed)
Sed Rate and CRP being drawn at SNF now, results tomorrow   Pt worried about needing to leave SNF tomorrow due to inability to pay for the cost of the SNF.  Advised pt that [facility] would call her after MD reviews lab results and can come up with a plan for the pt.  Pt stated should would wait on [facility]'s call.

## 2014-10-24 NOTE — Telephone Encounter (Signed)
Patient called back and asked if I had heard anything advised her no and that I also have not received the fax from the facility. She advised she will have the director fax the info to me. Also that she is being told that if she leaves the facility before they get orders it will be AMA and she will not be able to get any home health if she is eligible. Advised her will call the doctor as soon as I get the labs.

## 2014-10-24 NOTE — Telephone Encounter (Signed)
Labs received and there is only a CBC and BMP. Called the facility to ask if they have done a Sed Rate or CRP and advised no but they will draw one today and get us the results tomorrow. Advised will forward the information to the doctors and give a call back once I get an answer.

## 2014-10-25 ENCOUNTER — Telehealth: Payer: Self-pay | Admitting: Internal Medicine

## 2014-10-25 MED ORDER — DOXYCYCLINE HYCLATE 100 MG PO TABS: 100.0000 mg | ORAL_TABLET | Freq: Two times a day (BID) | ORAL | Status: DC

## 2014-10-25 MED ORDER — PROMETHAZINE HCL 12.5 MG PO TABS: 12.5000 mg | ORAL_TABLET | Freq: Four times a day (QID) | ORAL | Status: AC | PRN

## 2014-10-25 NOTE — Telephone Encounter (Addendum)
Per Orvan Falconerampbell and SharonSnider called the patient and informed her that they have given the order for her to be released on oral antibiotics Doxy/Rifampin and have the PICC pulled today as well. She is to eat with the Doxy and we called in phenergan just in case. Advised her to make sure she keeps appt with ID and Ortho set for next week and we will access from there. The patient was happy and advised she will keep both appt and call if she has any problem prior to then.  Orders for D/C of patient and D/C of PICC faxed to the facility at [phone] [facility].

## 2014-10-25 NOTE — Addendum Note (Signed)
Addended by: Cliffton AstersAMPBELL, Adama Ivins on: 10/25/2014 11:05 AM   Modules accepted: Orders

## 2014-10-25 NOTE — Telephone Encounter (Signed)
Her sedimentation rate is down slightly to 53. Her C-reactive protein is 30. I will give an order to have her PICC removed today and will change her to a 2 drug oral regimen of doxycycline and rifampin.

## 2014-10-25 NOTE — Telephone Encounter (Signed)
[facility] She was sent to SNF after last wash out to continue out IV antibiotics. Her last 2 surgeries have not yielded any positive bacterial cultures. 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 We will switch her to doxycycline 100mg  BID for the remaining part of course of therapy (which is 8 more days) continue on rifampin 300mg  BID. Will also give rx for phenergan. She can go home from the ID standpoint. Will still need to follow up with Orthopedics team and their recommendation. She is to see Dr. Orvan Falconerampbell on Jan 7th

## 2014-10-29 ENCOUNTER — Emergency Department (HOSPITAL_COMMUNITY): Payer: Medicare Other

## 2014-10-29 ENCOUNTER — Emergency Department (HOSPITAL_COMMUNITY)
Admission: EM | Admit: 2014-10-29 | Discharge: 2014-10-29 | Disposition: A | Discharge status: Home / Self Care | Payer: Medicare Other | Attending: Emergency Medicine | Admitting: Emergency Medicine

## 2014-10-29 ENCOUNTER — Encounter (HOSPITAL_COMMUNITY): Payer: Self-pay | Admitting: Emergency Medicine

## 2014-10-29 DIAGNOSIS — F329 Major depressive disorder, single episode, unspecified: Secondary | ICD-10-CM | POA: Insufficient documentation

## 2014-10-29 DIAGNOSIS — Z87891 Personal history of nicotine dependence: Secondary | ICD-10-CM | POA: Diagnosis not present

## 2014-10-29 DIAGNOSIS — Y998 Other external cause status: Secondary | ICD-10-CM | POA: Insufficient documentation

## 2014-10-29 DIAGNOSIS — S81811A Laceration without foreign body, right lower leg, initial encounter: Secondary | ICD-10-CM | POA: Diagnosis not present

## 2014-10-29 DIAGNOSIS — Z96651 Presence of right artificial knee joint: Secondary | ICD-10-CM | POA: Insufficient documentation

## 2014-10-29 DIAGNOSIS — Y9389 Activity, other specified: Secondary | ICD-10-CM | POA: Insufficient documentation

## 2014-10-29 DIAGNOSIS — S0083XA Contusion of other part of head, initial encounter: Secondary | ICD-10-CM | POA: Insufficient documentation

## 2014-10-29 DIAGNOSIS — Z7982 Long term (current) use of aspirin: Secondary | ICD-10-CM | POA: Diagnosis not present

## 2014-10-29 DIAGNOSIS — Z9981 Dependence on supplemental oxygen: Secondary | ICD-10-CM | POA: Diagnosis not present

## 2014-10-29 DIAGNOSIS — Y9289 Other specified places as the place of occurrence of the external cause: Secondary | ICD-10-CM | POA: Insufficient documentation

## 2014-10-29 DIAGNOSIS — K219 Gastro-esophageal reflux disease without esophagitis: Secondary | ICD-10-CM | POA: Insufficient documentation

## 2014-10-29 DIAGNOSIS — W07XXXA Fall from chair, initial encounter: Secondary | ICD-10-CM | POA: Insufficient documentation

## 2014-10-29 DIAGNOSIS — S79912A Unspecified injury of left hip, initial encounter: Secondary | ICD-10-CM | POA: Insufficient documentation

## 2014-10-29 DIAGNOSIS — S4991XA Unspecified injury of right shoulder and upper arm, initial encounter: Secondary | ICD-10-CM | POA: Diagnosis not present

## 2014-10-29 DIAGNOSIS — M1990 Unspecified osteoarthritis, unspecified site: Secondary | ICD-10-CM | POA: Insufficient documentation

## 2014-10-29 DIAGNOSIS — W19XXXA Unspecified fall, initial encounter: Secondary | ICD-10-CM

## 2014-10-29 DIAGNOSIS — M25551 Pain in right hip: Secondary | ICD-10-CM

## 2014-10-29 DIAGNOSIS — Z79899 Other long term (current) drug therapy: Secondary | ICD-10-CM | POA: Diagnosis not present

## 2014-10-29 DIAGNOSIS — Z8669 Personal history of other diseases of the nervous system and sense organs: Secondary | ICD-10-CM | POA: Insufficient documentation

## 2014-10-29 DIAGNOSIS — Z872 Personal history of diseases of the skin and subcutaneous tissue: Secondary | ICD-10-CM | POA: Insufficient documentation

## 2014-10-29 DIAGNOSIS — Z8709 Personal history of other diseases of the respiratory system: Secondary | ICD-10-CM | POA: Diagnosis not present

## 2014-10-29 DIAGNOSIS — G43909 Migraine, unspecified, not intractable, without status migrainosus: Secondary | ICD-10-CM | POA: Insufficient documentation

## 2014-10-29 DIAGNOSIS — M25552 Pain in left hip: Secondary | ICD-10-CM

## 2014-10-29 DIAGNOSIS — Z8742 Personal history of other diseases of the female genital tract: Secondary | ICD-10-CM | POA: Insufficient documentation

## 2014-10-29 DIAGNOSIS — M25511 Pain in right shoulder: Secondary | ICD-10-CM

## 2014-10-29 MED ORDER — HYDROMORPHONE HCL 1 MG/ML IJ SOLN: 1.0000 mg | Freq: Once | INTRAMUSCULAR | Status: DC

## 2014-10-29 MED ORDER — LORAZEPAM 2 MG/ML IJ SOLN
1.0000 mg | Freq: Once | INTRAMUSCULAR | Status: AC
  Administered 2014-10-29: 1 mg via INTRAMUSCULAR

## 2014-10-29 MED ORDER — LIDOCAINE-EPINEPHRINE 2 %-1:100000 IJ SOLN
20.0000 mL | Freq: Once | INTRAMUSCULAR | Status: AC
  Administered 2014-10-29: 20 mL
  Filled 2014-10-29: qty 1

## 2014-10-29 MED ORDER — HYDROMORPHONE HCL 1 MG/ML IJ SOLN
INTRAMUSCULAR | Status: AC
  Filled 2014-10-29: qty 1

## 2014-10-29 MED ORDER — HYDROMORPHONE HCL 1 MG/ML IJ SOLN
1.0000 mg | Freq: Once | INTRAMUSCULAR | Status: AC
  Administered 2014-10-29: 1 mg via INTRAMUSCULAR
  Filled 2014-10-29: qty 1

## 2014-10-29 MED ORDER — BACITRACIN ZINC 500 UNIT/GM EX OINT
TOPICAL_OINTMENT | CUTANEOUS | Status: AC
  Administered 2014-10-29: 16:00:00
  Filled 2014-10-29: qty 0.9

## 2014-10-29 MED ORDER — LORAZEPAM 2 MG/ML IJ SOLN
INTRAMUSCULAR | Status: AC
  Filled 2014-10-29: qty 1

## 2014-10-29 MED ORDER — HYDROMORPHONE HCL 1 MG/ML IJ SOLN
1.0000 mg | Freq: Once | INTRAMUSCULAR | Status: AC
  Administered 2014-10-29: 1 mg via INTRAMUSCULAR

## 2014-10-29 NOTE — ED Provider Notes (Signed)
CSN: [id number]     Arrival date & time 10/29/14  1012 History   First MD Initiated Contact with Patient 10/29/14 1018     Chief Complaint  Patient presents with  . Fall   HPI  Patient is a 59 year old female with past medical history depression, migraines, left hip replacement with revision, arthritis, chronic narcotic use, and bilateral lower extremity venous stasis dermatitis who presents emergency room for evaluation after a fall. Patient states that she was riding her motorized wheelchair down a ramp which is new and got nervous and flipped her chair over. Patient fell forward out of her chair and landed on her right side she did strike her head. The patient reports that she is having pain in her right forehead, right shoulder, and right hip. She also notes that she has a laceration to her right shin. Patient states that she is on aspirin since she recently had her left hip replaced.  Patient has not been on his other blood thinners since last week. He states that Dr. Charlann Boxer with the surgeon who performed her hip surgery. Patient denies any headache, neck pain, visual changes, nausea, vomiting, chest pain, back pain, tingling or numbness different from her usual neuropathy pain.  Patient's up-to-date on her tetanus shot. Past Medical History  Diagnosis Date  . Tenosynovitis of fingers     RIGHT RING AND SMALL-HAD SURGERY 07/12/14 [facility]  . Bronchitis     dx  07-04-2014 (approx) will finished antibiotic 07-14-2014  . History of concussion     x4  --  no residual  . Migraines   . Depression   . 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 GERD (gastroesophageal reflux disease)   . H/O hiatal hernia   . Chronic constipation   . Chronic narcotic use   . Nocturia   . SUI (stress urinary incontinence, female)   . OA (osteoarthritis)     hips, knees, hips  . Infection of left prosthetic hip joint       left total arthroplasty done 2012/  jan 2015 removal of prosthesis  . Full dentures   . History of cellulitis     lower extremities  . Thinning of skin     easily tears  . Venous stasis dermatitis     BILATERAL LEGS  . Productive cough     RESOLVED - QUIT SMOKING  . Memory problem   . OSA on CPAP     study x3   last one few yrs ago  cpap recommended -- DOES NOT USE OR HAVE CPAP   Past Surgical History  Procedure Laterality Date  . Appendectomy  56 (age 21)  . 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 Orif left foot fx  1990's  . Orif right tibial fx  2008    hardware removed same year  . Open repair and fixation right tibia and knee  X2  2009  . Total knee arthroplasty Right X2  2009  &  2010  . Total knee arthroplasty Left 2009  . Knee arthroscopy Left 2009  . Orif pelvic fx's  11 /2011  . Total hip arthroplasty Left 2012  . Revision total hip arthroplasty Left 01/ 2015  . Removal prothesis  left  hip arthroplasty  02/ 2015    INFECTION  . Laparoscopic cholecystectomy  08-12-2003  . Tenosynovectomy Right 07/12/2014    Procedure: RIGHT RING/SMALL FINGER TENOSYNOVECTOMY;  Surgeon: Sharma Covert, MD;  Location: [facility];  Service: Orthopedics;  Laterality: Right;  . Total hip revision Left 08/24/2014    Procedure: INCISION AND DRAINAGE LEFT HIP, EXCHANGE ANTIBIOTIC SPACER, OPEN REDUCTION LEFT FEMUR FRACTURE ;  Surgeon: Loanne Drilling, MD;  Location: [facility];  Service: Orthopedics;  Laterality: Left;  . Total hip revision Left 10/14/2014    Procedure: PARTIAL HIP REVISION;  Surgeon: Shelda Pal, MD;  Location: [facility];  Service: Orthopedics;  Laterality: Left;   Family History  Problem Relation Age of Onset  . COPD Mother   . Hypertension Father   . Pancreatic cancer Father   . COPD Brother    History  Substance Use Topics  . Smoking status: Former Smoker -- 1.00  packs/day for 30 years    Types: Cigarettes    Quit date: 07/09/2014  . Smokeless tobacco: Never Used     Comment: PT STATES ATTEMPTING TO QUIT SMOKING 07-09-2014--  USING NICOTIN LOZENGERS  . Alcohol Use: No   OB History    No data available     Review of Systems  Constitutional: Negative for fever, chills and fatigue.  Eyes: Negative for visual disturbance.  Respiratory: Negative for chest tightness and shortness of breath.   Cardiovascular: Negative for chest pain and palpitations.  Gastrointestinal: Negative for nausea, vomiting, abdominal pain, diarrhea and constipation.  Musculoskeletal: Positive for arthralgias.  Skin: Positive for wound.  All other systems reviewed and are negative.     Allergies  Vancomycin  Home Medications   Prior to Admission medications   Medication Sig Start Date End Date Taking? Authorizing Provider  acetaminophen (TYLENOL) 325 MG tablet Take 2 tablets (650 mg total) by mouth every 6 (six) hours as needed for mild pain (or Fever >/= 101). 08/29/14  Yes Avel Peace, PA-C  Ascorbic Acid (VITAMIN C) 100 MG tablet Take 100 mg by mouth daily.   Yes Historical Provider, MD  aspirin 325 MG tablet Take 325 mg by mouth daily.    Yes Historical Provider, MD  b complex vitamins tablet Take 1 tablet by mouth daily.   Yes Historical Provider, MD  cholecalciferol 400 UNITS tablet Take 2 tablets (800 Units total) by mouth daily. 10/16/14  Yes Maryann Mikhail, DO  clotrimazole-betamethasone (LOTRISONE) cream Apply 1 application topically 2 (two) times daily as needed (rash in skin folds).   Yes Historical Provider, MD  doxycycline (VIBRA-TABS) 100 MG tablet Take 1 tablet (100 mg total) by mouth 2 (two) times daily. 10/25/14  Yes Cliffton Asters, MD  DULoxetine (CYMBALTA) 60 MG capsule Take 60 mg by mouth daily.  09/24/14 09/24/15 Yes Historical Provider, MD  esomeprazole (NEXIUM) 40 MG capsule Take 40 mg by mouth. 09/24/14 09/24/15 Yes Historical Provider, MD    ferrous sulfate 325 (65 FE) MG EC tablet Take 325 mg by mouth daily.   Yes Historical Provider, MD  furosemide (LASIX) 40 MG tablet Take 40 mg by mouth 2 (two) times daily as needed (fluid retention).   Yes Historical Provider, MD  gabapentin (NEURONTIN) 400 MG capsule Take 2 capsules (800 mg total) by mouth 2 (two) times daily. Patient taking differently: Take 400-800 mg by mouth 2 (two) times daily. Pt takes 400 mg BID and 800 mg BID.= 2400 mg a day. 10/16/14  Yes Edsel Petrin,  DO  hydrOXYzine (ATARAX/VISTARIL) 25 MG tablet Take 25 mg by mouth every 6 (six) hours as needed for anxiety (anxiety).    Yes Historical Provider, MD  IRON PO Take 1 tablet by mouth every morning.   Yes Historical Provider, MD  methocarbamol (ROBAXIN) 500 MG tablet Take 1 tablet (500 mg total) by mouth every 6 (six) hours as needed for muscle spasms. 08/29/14  Yes Avel Peace, PA-C  metoCLOPramide (REGLAN) 5 MG tablet Take 1-2 tablets (5-10 mg total) by mouth every 8 (eight) hours as needed for nausea (if ondansetron (ZOFRAN) ineffective.). 08/29/14  Yes Avel Peace, PA-C  Multiple Vitamin (MULTIVITAMIN) tablet Take 1 tablet by mouth at bedtime.    Yes Historical Provider, MD  Naloxegol Oxalate (MOVANTIK PO) Take 25 mg by mouth at bedtime.    Yes Historical Provider, MD  nicotine polacrilex (COMMIT) 4 MG lozenge Take 4 mg by mouth as needed for smoking cessation (smoking cessation).    Yes Historical Provider, MD  Omega-3 Fatty Acids (FISH OIL) 1000 MG CAPS Take 1 capsule by mouth daily.   Yes Historical Provider, MD  ondansetron (ZOFRAN) 4 MG tablet Take 1 tablet (4 mg total) by mouth every 6 (six) hours as needed for nausea. 08/29/14  Yes Avel Peace, PA-C  Oxycodone HCl 10 MG TABS Take 1-2 tablets (10-20 mg total) by mouth every 3 (three) hours as needed. Patient taking differently: Take 10-20 mg by mouth every 3 (three) hours as needed (pain).  10/16/14  Yes Maryann Mikhail, DO  oxymorphone (OPANA ER) 20 MG 12 hr  tablet Take 1 tablet (20 mg total) by mouth 3 (three) times daily. 10/22/14  Yes Tiffany L Reed, DO  promethazine (PHENERGAN) 12.5 MG tablet Take 1 tablet (12.5 mg total) by mouth every 6 (six) hours as needed for nausea or vomiting. 10/25/14  Yes Cliffton Asters, MD  rifampin (RIFADIN) 300 MG capsule Take 1 capsule (300 mg total) by mouth 2 (two) times daily. DO NOT START UNTIL Tuesday  09/18/2014 WHEN THE PATIENT COMES OFF THE XARELTO. 10/11/14  Yes Cliffton Asters, MD  sodium chloride (OCEAN) 0.65 % SOLN nasal spray Place 2 sprays into both nostrils daily as needed for congestion.   Yes Historical Provider, MD  solifenacin (VESICARE) 10 MG tablet Take 10 mg by mouth at bedtime.  09/24/14 09/24/15 Yes Historical Provider, MD  SUMAtriptan (IMITREX) 100 MG tablet Take 100 mg by mouth once. May repeat in 2 hours if headache persists or recurs. NO MORE THAN 2 IN A 24 HOUR PERIOD   Yes Historical Provider, MD  traZODone (DESYREL) 150 MG tablet Take 150-300 mg by mouth at bedtime.    Yes Historical Provider, MD  triamcinolone cream (KENALOG) 0.1 % Apply 1 application topically 2 (two) times daily. CALF OF BOTH LEGS - BECAUSE OF SKIN IRRITATION THOUGHT TO BE DUE TO SWELLING OF LEGS   Yes Historical Provider, MD  zinc gluconate 50 MG tablet Take 50 mg by mouth daily.   Yes Historical Provider, MD  alum & mag hydroxide-simeth (MAALOX/MYLANTA) 200-200-20 MG/5ML suspension Take 30 mLs by mouth every 4 (four) hours as needed for indigestion. Patient not taking: Reported on 10/29/2014 10/16/14   Nita Sells Mikhail, DO  Amino Acids-Protein Hydrolys (FEEDING SUPPLEMENT, PRO-STAT SUGAR FREE 64,) LIQD Take 30 mLs by mouth 3 (three) times daily with meals. Patient not taking: Reported on 10/29/2014 10/16/14   Nita Sells Mikhail, DO  bisacodyl (DULCOLAX) 10 MG suppository Place 1 suppository (10 mg total) rectally daily as needed for  moderate constipation. Patient not taking: Reported on 10/29/2014 08/29/14   Avel Peace, PA-C    ceftaroline 600 mg in sodium chloride 0.9 % 250 mL Inject 600 mg into the vein every 12 (twelve) hours. Patient not taking: Reported on 10/29/2014 10/16/14   Maryann Mikhail, DO   BP 134/49 mmHg  Pulse 80  Temp(Src) 97.9 F (36.6 C) (Oral)  Resp 18  SpO2 97% Physical Exam  Constitutional: She is oriented to person, place, and time. She appears well-developed and well-nourished. No distress.  HENT:  Head: Normocephalic.  Mouth/Throat: Oropharynx is clear and moist. No oropharyngeal exudate.  Hematoma right forehead  Eyes: Conjunctivae and EOM are normal. Pupils are equal, round, and reactive to light. No scleral icterus.  Neck: Normal range of motion. Neck supple. No JVD present. No thyromegaly present.  Cardiovascular: Normal rate, regular rhythm, normal heart sounds and intact distal pulses.  Exam reveals no gallop and no friction rub.   No murmur heard. Pulmonary/Chest: Effort normal and breath sounds normal. No respiratory distress. She has no wheezes. She has no rales. She exhibits no tenderness.  Abdominal: Soft. Bowel sounds are normal. She exhibits no distension and no mass. There is no tenderness. There is no rebound and no guarding.  Musculoskeletal:       Right shoulder: She exhibits pain. She exhibits normal range of motion, no tenderness, no bony tenderness, no swelling, no effusion, no crepitus, no deformity, no laceration, no spasm, normal pulse and normal strength.       Right hip: Normal.       Left hip: She exhibits decreased range of motion, decreased strength and tenderness. She exhibits no bony tenderness, no swelling, no crepitus, no deformity and no laceration.       Legs: Lymphadenopathy:    She has no cervical adenopathy.  Neurological: She is alert and oriented to person, place, and time. She has normal strength. No cranial nerve deficit or sensory deficit. Coordination normal.  Skin: Skin is warm and dry. She is not diaphoretic.     There is bilateral lower  extremity venous stasis dermatitis  Psychiatric: She has a normal mood and affect. Her behavior is normal. Judgment and thought content normal.  Nursing note and vitals reviewed.      ED Course  LACERATION REPAIR Date/Time: 10/29/2014 3:50 PM Performed by: Terri Piedra A Authorized by: Terri Piedra A Consent: Verbal consent obtained. Risks and benefits: risks, benefits and alternatives were discussed Consent given by: patient Patient understanding: patient states understanding of the procedure being performed Patient consent: the patient's understanding of the procedure matches consent given Procedure consent: procedure consent matches procedure scheduled Relevant documents: relevant documents present and verified Test results: test results available and properly labeled Site marked: the operative site was marked Imaging studies: imaging studies available Patient identity confirmed: verbally with patient Time out: Immediately prior to procedure a "time out" was called to verify the correct patient, procedure, equipment, support staff and site/side marked as required. Body area: lower extremity Location details: right lower leg Laceration length: 6 cm Foreign bodies: no foreign bodies Tendon involvement: none Nerve involvement: none Vascular damage: no Anesthesia: local infiltration Local anesthetic: lidocaine 2% with epinephrine Anesthetic total: 15 ml Patient sedated: no Preparation: Patient was prepped and draped in the usual sterile fashion. Irrigation solution: saline Irrigation method: syringe Amount of cleaning: extensive Debridement: minimal Degree of undermining: none Skin closure: staples Number of sutures: 27 (staples) Approximation: close Approximation difficulty: simple Dressing: antibiotic ointment and gauze  roll Patient tolerance: Patient tolerated the procedure well with no immediate complications   (including critical care time) Labs  Review Labs Reviewed - No data to display  Imaging Review Dg Shoulder Right  10/29/2014   CLINICAL DATA:  Status post fall out of a wheelchair this morning. Right shoulder pain. Initial encounter.  EXAM: RIGHT SHOULDER - 2+ VIEW  COMPARISON:  None.  FINDINGS: No acute bony or joint abnormality is identified. 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 Imaged right lung and ribs are unremarkable.  IMPRESSION: No acute finding.   Electronically Signed   By: Drusilla Kanner M.D.   On: 10/29/2014 12:52   Dg Tibia/fibula Right  10/29/2014   CLINICAL DATA:  Larey Seat out of wheelchair.  Laceration.  EXAM: RIGHT TIBIA AND FIBULA - 2 VIEW  COMPARISON:  None.  FINDINGS: The patient is status post RIGHT total knee replacement. There is a long stem in the tibial component with evidence for previous tibial fracture and healing. No dislocation is evident. There is marked soft tissue swelling of the lower leg. A bandage overlies the anterior and lateral aspect of the lower leg corresponding to the laceration. No radiopaque foreign body.  IMPRESSION: Negative for fracture.  Soft tissue swelling.   Electronically Signed   By: Davonna Belling M.D.   On: 10/29/2014 12:44   Ct Head Wo Contrast  10/29/2014   CLINICAL DATA:  Larey Seat from scooter.  Head pain.  EXAM: CT HEAD WITHOUT CONTRAST  TECHNIQUE: Contiguous axial images were obtained from the base of the skull through the vertex without intravenous contrast.  COMPARISON:  CT head 06/13/2010.  FINDINGS: RIGHT frontal scalp soft tissue swelling. No skull fracture. No evidence for acute infarction, hemorrhage, mass lesion, hydrocephalus, or extra-axial fluid. Premature for age cerebral and cerebellar atrophy. No appreciable white matter disease. Hyperostosis. No sinus air-fluid level. Negative mastoids. Other than the scalp hematoma, there is no change from priors.  IMPRESSION: RIGHT frontal scalp hematoma. No skull fracture or intracranial hemorrhage. Premature for  age atrophy.   Electronically Signed   By: Davonna Belling M.D.   On: 10/29/2014 13:09   Dg Hips Bilat With Pelvis Min 5 Views  10/29/2014   CLINICAL DATA:  Pain secondary to a fall out of a wheelchair of this morning. Large laceration of the right lower leg. Right hip and right shoulder pain.  EXAM: BILATERAL HIPS WITH AP PELVIS  TECHNIQUE: AP pelvis with AP and lateral views of both hips  COMPARISON:  Radiographs dated 10/11/2014 and 08/29/2014  FINDINGS: The right hip appears normal. There is an old fracture of the left acetabulum with secondary deformity. Since the prior radiograph of 10/11/2014 the femoral head component of the left hip prosthesis has been relocated into the distorted left acetabulum. The methylmethacrylate remains dislocated from the acetabulum.  There are no acute fractures of the left femur. Left total knee prosthesis, side plate, screws, cerclage wires, and the femoral stem of the left hip prosthesis appear unchanged.  IMPRESSION: No acute abnormality. Femoral head of the left hip prosthesis is now relocated into the distorted acetabulum. Methylmethacrylate remains dislocated from the acetabulum.   Electronically Signed   By: Geanie Cooley M.D.   On: 10/29/2014 12:51     EKG Interpretation None      MDM   Final diagnoses:  Fall  Leg laceration, right, initial encounter  Right shoulder pain  Bilateral hip pain   Patient is a 59 year old female who presents emergency room for evaluation  of injuries after a fall. Physical exam reveals no focal neurological deficits. There is a hematoma of the right forehead. There is a well-healing surgical incision of the left hip. There is minimal tenderness of the right bony shoulder and right hip. Plain, x-rays of the pelvis, right shoulder are negative. There are no changes in the hardware of the left hip. There is no acute processes found on the head CT. Wound on the leg was closed as seen above. Patient is up-to-date on her tetanus shots.  I have advised the patient to watch for signs of infection. Patient states understanding and agreement at this time. I have advised the patient to return to her primary care provider or have her home health nurse remove her staples and 21 days. States understanding and agreement at this time. She is to return for changes in her baseline behavior, signs of infection, or any other concerning symptoms. She states understanding and agreement at this time patient has pain medication at home and is not requesting any pain medicine at this time. Patient discussed with Dr. Juleen China who agrees with the above workup and plan. Patient stable for discharge.  Eben Burow, PA-C 10/29/14 1555  Raeford Razor, MD 10/30/14 [phone]

## 2014-10-29 NOTE — ED Notes (Signed)
Pt in xray/CT at present time. Will administer pain medication with pt return.

## 2014-10-29 NOTE — ED Notes (Signed)
Forcucci, PA notified patient requesting pain medication.

## 2014-10-29 NOTE — ED Notes (Signed)
Bed: WA06 Expected date:  Expected time:  Means of arrival:  Comments: EMS- fell out of wheelchair, hematoma to head and laceration to leg

## 2014-10-29 NOTE — ED Notes (Addendum)
IV attempt unsuccessful. Per PA hold IV and give pain medication administration via IM route.

## 2014-10-29 NOTE — ED Notes (Signed)
Upon further assessment pt reports right forehead, right leg, and right shoulder pain post fall.

## 2014-10-29 NOTE — Discharge Instructions (Signed)
Laceration Care, Adult °A laceration is a cut or lesion that goes through all layers of the skin and into the tissue just beneath the skin. °TREATMENT  °Some lacerations may not require closure. Some lacerations may not be able to be closed due to an increased risk of infection. It is important to see your caregiver as soon as possible after an injury to minimize the risk of infection and maximize the opportunity for successful closure. °If closure is appropriate, pain medicines may be given, if needed. The wound will be cleaned to help prevent infection. Your caregiver will use stitches (sutures), staples, wound glue (adhesive), or skin adhesive strips to repair the laceration. These tools bring the skin edges together to allow for faster healing and a better cosmetic outcome. However, all wounds will heal with a scar. Once the wound has healed, scarring can be minimized by covering the wound with sunscreen during the day for 1 full year. °HOME CARE INSTRUCTIONS  °For sutures or staples: °· Keep the wound clean and dry. °· If you were given a bandage (dressing), you should change it at least once a day. Also, change the dressing if it becomes wet or dirty, or as directed by your caregiver. °· Wash the wound with soap and water 2 times a day. Rinse the wound off with water to remove all soap. Pat the wound dry with a clean towel. °· After cleaning, apply a thin layer of the antibiotic ointment as recommended by your caregiver. This will help prevent infection and keep the dressing from sticking. °· You may shower as usual after the first 24 hours. Do not soak the wound in water until the sutures are removed. °· Only take over-the-counter or prescription medicines for pain, discomfort, or fever as directed by your caregiver. °· Get your sutures or staples removed as directed by your caregiver. °For skin adhesive strips: °· Keep the wound clean and dry. °· Do not get the skin adhesive strips wet. You may bathe  carefully, using caution to keep the wound dry. °· If the wound gets wet, pat it dry with a clean towel. °· Skin adhesive strips will fall off on their own. You may trim the strips as the wound heals. Do not remove skin adhesive strips that are still stuck to the wound. They will fall off in time. °For wound adhesive: °· You may briefly wet your wound in the shower or bath. Do not soak or scrub the wound. Do not swim. Avoid periods of heavy perspiration until the skin adhesive has fallen off on its own. After showering or bathing, gently pat the wound dry with a clean towel. °· Do not apply liquid medicine, cream medicine, or ointment medicine to your wound while the skin adhesive is in place. This may loosen the film before your wound is healed. °· If a dressing is placed over the wound, be careful not to apply tape directly over the skin adhesive. This may cause the adhesive to be pulled off before the wound is healed. °· Avoid prolonged exposure to sunlight or tanning lamps while the skin adhesive is in place. Exposure to ultraviolet light in the first year will darken the scar. °· The skin adhesive will usually remain in place for 5 to 10 days, then naturally fall off the skin. Do not pick at the adhesive film. °You may need a tetanus shot if: °· You cannot remember when you had your last tetanus shot. °· You have never had a tetanus   shot. °If you get a tetanus shot, your arm may swell, get red, and feel warm to the touch. This is common and not a problem. If you need a tetanus shot and you choose not to have one, there is a rare chance of getting tetanus. Sickness from tetanus can be serious. °SEEK MEDICAL CARE IF:  °· You have redness, swelling, or increasing pain in the wound. °· You see a red line that goes away from the wound. °· You have yellowish-white fluid (pus) coming from the wound. °· You have a fever. °· You notice a bad smell coming from the wound or dressing. °· Your wound breaks open before or  after sutures have been removed. °· You notice something coming out of the wound such as wood or glass. °· Your wound is on your hand or foot and you cannot move a finger or toe. °SEEK IMMEDIATE MEDICAL CARE IF:  °· Your pain is not controlled with prescribed medicine. °· You have severe swelling around the wound causing pain and numbness or a change in color in your arm, hand, leg, or foot. °· Your wound splits open and starts bleeding. °· You have worsening numbness, weakness, or loss of function of any joint around or beyond the wound. °· You develop painful lumps near the wound or on the skin anywhere on your body. °MAKE SURE YOU:  °· Understand these instructions. °· Will watch your condition. °· Will get help right away if you are not doing well or get worse. °Document Released: 10/12/2005 Document Revised: 01/04/2012 Document Reviewed: 04/07/2011 °ExitCare® Patient Information ©2015 [employer]. This information is not intended to replace advice given to you by your health care provider. Make sure you discuss any questions you have with your health care provider. °Fall Prevention and Home Safety °Falls cause injuries and can affect all age groups. It is possible to use preventive measures to significantly decrease the likelihood of falls. There are many simple measures which can make your home safer and prevent falls. °OUTDOORS °· Repair cracks and edges of walkways and driveways. °· Remove high doorway thresholds. °· Trim shrubbery on the main path into your home. °· Have good outside lighting. °· Clear walkways of tools, rocks, debris, and clutter. °· Check that handrails are not broken and are securely fastened. Both sides of steps should have handrails. °· Have leaves, snow, and ice cleared regularly. °· Use sand or salt on walkways during winter months. °· In the garage, clean up grease or oil spills. °BATHROOM °· Install night lights. °· Install grab bars by the toilet and in the tub and  shower. °· Use non-skid mats or decals in the tub or shower. °· Place a plastic non-slip stool in the shower to sit on, if needed. °· Keep floors dry and clean up all water on the floor immediately. °· Remove soap buildup in the tub or shower on a regular basis. °· Secure bath mats with non-slip, double-sided rug tape. °· Remove throw rugs and tripping hazards from the floors. °BEDROOMS °· Install night lights. °· Make sure a bedside light is easy to reach. °· Do not use oversized bedding. °· Keep a telephone by your bedside. °· Have a firm chair with side arms to use for getting dressed. °· Remove throw rugs and tripping hazards from the floor. °KITCHEN °· Keep handles on pots and pans turned toward the center of the stove. Use back burners when possible. °· Clean up spills quickly and allow time   for drying. °· Avoid walking on wet floors. °· Avoid hot utensils and knives. °· Position shelves so they are not too high or low. °· Place commonly used objects within easy reach. °· If necessary, use a sturdy step stool with a grab bar when reaching. °· Keep electrical cables out of the way. °· Do not use floor polish or wax that makes floors slippery. If you must use wax, use non-skid floor wax. °· Remove throw rugs and tripping hazards from the floor. °STAIRWAYS °· Never leave objects on stairs. °· Place handrails on both sides of stairways and use them. Fix any loose handrails. Make sure handrails on both sides of the stairways are as long as the stairs. °· Check carpeting to make sure it is firmly attached along stairs. Make repairs to worn or loose carpet promptly. °· Avoid placing throw rugs at the top or bottom of stairways, or properly secure the rug with carpet tape to prevent slippage. Get rid of throw rugs, if possible. °· Have an electrician put in a light switch at the top and bottom of the stairs. °OTHER FALL PREVENTION TIPS °· Wear low-heel or rubber-soled shoes that are supportive and fit well. Wear  closed toe shoes. °· When using a stepladder, make sure it is fully opened and both spreaders are firmly locked. Do not climb a closed stepladder. °· Add color or contrast paint or tape to grab bars and handrails in your home. Place contrasting color strips on first and last steps. °· Learn and use mobility aids as needed. Install an electrical emergency response system. °· Turn on lights to avoid dark areas. Replace light bulbs that burn out immediately. Get light switches that glow. °· Arrange furniture to create clear pathways. Keep furniture in the same place. °· Firmly attach carpet with non-skid or double-sided tape. °· Eliminate uneven floor surfaces. °· Select a carpet pattern that does not visually hide the edge of steps. °· Be aware of all pets. °OTHER HOME SAFETY TIPS °· Set the water temperature for 120° F (48.8° C). °· Keep emergency numbers on or near the telephone. °· Keep smoke detectors on every level of the home and near sleeping areas. °Document Released: 10/02/2002 Document Revised: 04/12/2012 Document Reviewed: 01/01/2012 °ExitCare® Patient Information ©2015 [employer]. This information is not intended to replace advice given to you by your health care provider. Make sure you discuss any questions you have with your health care provider. ° °
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
## 2014-10-29 NOTE — ED Notes (Signed)
Pt in CT at present time 

## 2014-10-31 ENCOUNTER — Telehealth: Payer: Self-pay | Admitting: *Deleted

## 2014-10-31 NOTE — Telephone Encounter (Signed)
Patient called stating she needed to cancel tomorrow's appt with Dr. Orvan Falconerampbell because she has dislocated her left hip and Dr. Berton LanAllusio does not want [place] [facility] today and fax results here. She is requesting that Dr. Orvan Falconerampbell speak to Dr. Berton LanAllusio once he sees the results of her labs because he is contemplating "partial permanent surgery" depending on her labs.

## 2014-11-01 ENCOUNTER — Ambulatory Visit: Payer: Medicare Other | Admitting: Internal Medicine

## 2014-11-12 ENCOUNTER — Telehealth: Payer: Self-pay | Admitting: *Deleted

## 2014-11-12 NOTE — Telephone Encounter (Signed)
[facility] with [facility] and they did not have our correct fax #. They are faxing labs from 11/06/14 and when today's result will fax those also.

## 2014-11-12 NOTE — Telephone Encounter (Signed)
Patient called stating [facility] went [facility] by ambulance for fever. She was asking if Dr. Orvan Falconerampbell saw her lab results. Advised she would have needed to sign a release at [facility] for the records to be sent. [facility] and will request those results faxed to Dr. Orvan Falconerampbell. She still did not schedule a follow up here and is unsure when she will be able to do this.

## 2014-11-12 NOTE — Telephone Encounter (Signed)
I have not seen any of her recent lab results.

## 2014-11-13 ENCOUNTER — Telehealth: Payer: Self-pay | Admitting: *Deleted

## 2014-11-13 NOTE — Telephone Encounter (Signed)
Per Dr. Orvan Falconerampbell, Made f/u appt for 11/27/14 @ 2:30 PM

## 2014-11-15 ENCOUNTER — Inpatient Hospital Stay (HOSPITAL_COMMUNITY)
Admission: AD | Admit: 2014-11-15 | Discharge: 2014-11-19 | DRG: 465 | Disposition: A | Discharge status: Home Health | Payer: Medicare Other | Attending: Orthopedic Surgery | Admitting: Orthopedic Surgery

## 2014-11-15 ENCOUNTER — Other Ambulatory Visit: Payer: Self-pay | Admitting: Surgical

## 2014-11-15 ENCOUNTER — Telehealth: Payer: Self-pay | Admitting: *Deleted

## 2014-11-15 DIAGNOSIS — B957 Other staphylococcus as the cause of diseases classified elsewhere: Secondary | ICD-10-CM | POA: Diagnosis present

## 2014-11-15 DIAGNOSIS — K219 Gastro-esophageal reflux disease without esophagitis: Secondary | ICD-10-CM | POA: Diagnosis present

## 2014-11-15 DIAGNOSIS — I872 Venous insufficiency (chronic) (peripheral): Secondary | ICD-10-CM | POA: Diagnosis present

## 2014-11-15 DIAGNOSIS — Z87891 Personal history of nicotine dependence: Secondary | ICD-10-CM

## 2014-11-15 DIAGNOSIS — Z79899 Other long term (current) drug therapy: Secondary | ICD-10-CM

## 2014-11-15 DIAGNOSIS — T8450XA Infection and inflammatory reaction due to unspecified internal joint prosthesis, initial encounter: Secondary | ICD-10-CM | POA: Diagnosis present

## 2014-11-15 DIAGNOSIS — M1990 Unspecified osteoarthritis, unspecified site: Secondary | ICD-10-CM | POA: Diagnosis present

## 2014-11-15 DIAGNOSIS — Z881 Allergy status to other antibiotic agents status: Secondary | ICD-10-CM | POA: Diagnosis not present

## 2014-11-15 DIAGNOSIS — G4733 Obstructive sleep apnea (adult) (pediatric): Secondary | ICD-10-CM | POA: Diagnosis present

## 2014-11-15 DIAGNOSIS — E669 Obesity, unspecified: Secondary | ICD-10-CM | POA: Diagnosis present

## 2014-11-15 DIAGNOSIS — Z825 Family history of asthma and other chronic lower respiratory diseases: Secondary | ICD-10-CM

## 2014-11-15 DIAGNOSIS — Z800 Family history of malignant neoplasm of digestive organs: Secondary | ICD-10-CM

## 2014-11-15 DIAGNOSIS — S7012XA Contusion of left thigh, initial encounter: Secondary | ICD-10-CM | POA: Diagnosis present

## 2014-11-15 DIAGNOSIS — Z8249 Family history of ischemic heart disease and other diseases of the circulatory system: Secondary | ICD-10-CM

## 2014-11-15 DIAGNOSIS — Z7982 Long term (current) use of aspirin: Secondary | ICD-10-CM

## 2014-11-15 DIAGNOSIS — Y838 Other surgical procedures as the cause of abnormal reaction of the patient, or of later complication, without mention of misadventure at the time of the procedure: Secondary | ICD-10-CM | POA: Diagnosis present

## 2014-11-15 DIAGNOSIS — M62838 Other muscle spasm: Secondary | ICD-10-CM | POA: Diagnosis not present

## 2014-11-15 DIAGNOSIS — K5900 Constipation, unspecified: Secondary | ICD-10-CM | POA: Diagnosis present

## 2014-11-15 DIAGNOSIS — R296 Repeated falls: Secondary | ICD-10-CM | POA: Diagnosis present

## 2014-11-15 DIAGNOSIS — T8452XA Infection and inflammatory reaction due to internal left hip prosthesis, initial encounter: Secondary | ICD-10-CM | POA: Diagnosis present

## 2014-11-15 DIAGNOSIS — Z79891 Long term (current) use of opiate analgesic: Secondary | ICD-10-CM | POA: Diagnosis not present

## 2014-11-15 DIAGNOSIS — Z6834 Body mass index (BMI) 34.0-34.9, adult: Secondary | ICD-10-CM

## 2014-11-15 DIAGNOSIS — Z96642 Presence of left artificial hip joint: Secondary | ICD-10-CM | POA: Diagnosis present

## 2014-11-15 LAB — CBC WITH DIFFERENTIAL/PLATELET
Basophils Absolute: 0 10*3/uL (ref 0.0–0.1)
Basophils Relative: 1 % (ref 0–1)
Eosinophils Absolute: 0.1 10*3/uL (ref 0.0–0.7)
Eosinophils Relative: 4 % (ref 0–5)
HCT: 30.2 % — ABNORMAL LOW (ref 36.0–46.0)
Hemoglobin: 9.9 g/dL — ABNORMAL LOW (ref 12.0–15.0)
Lymphocytes Relative: 17 % (ref 12–46)
Lymphs Abs: 0.4 10*3/uL — ABNORMAL LOW (ref 0.7–4.0)
MCH: 30.1 pg (ref 26.0–34.0)
MCHC: 32.8 g/dL (ref 30.0–36.0)
MCV: 91.8 fL (ref 78.0–100.0)
Monocytes Absolute: 0.3 10*3/uL (ref 0.1–1.0)
Monocytes Relative: 11 % (ref 3–12)
Neutro Abs: 1.6 10*3/uL — ABNORMAL LOW (ref 1.7–7.7)
Neutrophils Relative %: 67 % (ref 43–77)
Platelets: 138 10*3/uL — ABNORMAL LOW (ref 150–400)
RBC: 3.29 MIL/uL — ABNORMAL LOW (ref 3.87–5.11)
RDW: 14.5 % (ref 11.5–15.5)
WBC: 2.3 10*3/uL — ABNORMAL LOW (ref 4.0–10.5)

## 2014-11-15 LAB — COMPREHENSIVE METABOLIC PANEL
ALT: 11 U/L (ref 0–35)
AST: 24 U/L (ref 0–37)
Albumin: 2.8 g/dL — ABNORMAL LOW (ref 3.5–5.2)
Alkaline Phosphatase: 110 U/L (ref 39–117)
Anion gap: 5 (ref 5–15)
BUN: 9 mg/dL (ref 6–23)
CO2: 31 mmol/L (ref 19–32)
Calcium: 8.7 mg/dL (ref 8.4–10.5)
Chloride: 98 mEq/L (ref 96–112)
Creatinine, Ser: 0.68 mg/dL (ref 0.50–1.10)
GFR calc Af Amer: >90 mL/min (ref >90)
GFR calc non Af Amer: >90 mL/min (ref >90)
Glucose, Bld: 107 mg/dL — ABNORMAL HIGH (ref 70–99)
Potassium: 3.8 mmol/L (ref 3.5–5.1)
Sodium: 134 mmol/L — ABNORMAL LOW (ref 135–145)
Total Bilirubin: 0.2 mg/dL — ABNORMAL LOW (ref 0.3–1.2)
Total Protein: 7.6 g/dL (ref 6.0–8.3)

## 2014-11-15 LAB — URINALYSIS, ROUTINE W REFLEX MICROSCOPIC
Bilirubin Urine: NEGATIVE
GLUCOSE, UA: NEGATIVE mg/dL
Hgb urine dipstick: NEGATIVE
Ketones, ur: NEGATIVE mg/dL
Leukocytes, UA: NEGATIVE
NITRITE: NEGATIVE
PH: 7 (ref 5.0–8.0)
PROTEIN: NEGATIVE mg/dL
Specific Gravity, Urine: 1.011 (ref 1.005–1.030)
UROBILINOGEN UA: 0.2 mg/dL (ref 0.0–1.0)

## 2014-11-15 LAB — APTT: aPTT: 36 seconds (ref 24–37)

## 2014-11-15 LAB — PROTIME-INR
INR: 0.95 (ref 0.00–1.49)
Prothrombin Time: 12.8 seconds (ref 11.6–15.2)

## 2014-11-15 MED ORDER — METOCLOPRAMIDE HCL 10 MG PO TABS: 5.0000 mg | ORAL_TABLET | Freq: Three times a day (TID) | ORAL | Status: DC | PRN

## 2014-11-15 MED ORDER — SODIUM CHLORIDE 0.9 % IV SOLN
INTRAVENOUS | Status: DC
  Administered 2014-11-15 – 2014-11-17 (×3): via INTRAVENOUS

## 2014-11-15 MED ORDER — ACETAMINOPHEN 650 MG RE SUPP: 650.0000 mg | Freq: Four times a day (QID) | RECTAL | Status: DC | PRN

## 2014-11-15 MED ORDER — BISACODYL 10 MG RE SUPP: 10.0000 mg | Freq: Every day | RECTAL | Status: DC | PRN

## 2014-11-15 MED ORDER — SODIUM CHLORIDE 0.9 % IV SOLN: 250.0000 mL | INTRAVENOUS | Status: DC | PRN

## 2014-11-15 MED ORDER — FLEET ENEMA 7-19 GM/118ML RE ENEM: 1.0000 | ENEMA | Freq: Once | RECTAL | Status: AC | PRN

## 2014-11-15 MED ORDER — CLOTRIMAZOLE-BETAMETHASONE 1-0.05 % EX CREA
1.0000 "application " | TOPICAL_CREAM | Freq: Two times a day (BID) | CUTANEOUS | Status: DC | PRN
  Administered 2014-11-19: 1 via TOPICAL
  Filled 2014-11-15: qty 15

## 2014-11-15 MED ORDER — DULOXETINE HCL 60 MG PO CPEP
60.0000 mg | ORAL_CAPSULE | Freq: Every day | ORAL | Status: DC
  Administered 2014-11-16 – 2014-11-19 (×3): 60 mg via ORAL
  Filled 2014-11-15 (×4): qty 1

## 2014-11-15 MED ORDER — POLYETHYLENE GLYCOL 3350 17 G PO PACK: 17.0000 g | PACK | Freq: Every day | ORAL | Status: DC | PRN

## 2014-11-15 MED ORDER — OXYCODONE HCL 5 MG PO TABS
10.0000 mg | ORAL_TABLET | ORAL | Status: DC | PRN
  Administered 2014-11-15 – 2014-11-19 (×21): 20 mg via ORAL
  Filled 2014-11-15 (×21): qty 4

## 2014-11-15 MED ORDER — PROMETHAZINE HCL 25 MG PO TABS
12.5000 mg | ORAL_TABLET | Freq: Four times a day (QID) | ORAL | Status: DC | PRN
  Administered 2014-11-15: 12.5 mg via ORAL
  Filled 2014-11-15: qty 1

## 2014-11-15 MED ORDER — CHOLECALCIFEROL 10 MCG (400 UNIT) PO TABS
800.0000 [IU] | ORAL_TABLET | Freq: Every day | ORAL | Status: DC
  Administered 2014-11-16: 800 [IU] via ORAL
  Filled 2014-11-15 (×2): qty 2

## 2014-11-15 MED ORDER — DOXYCYCLINE HYCLATE 100 MG PO TABS
100.0000 mg | ORAL_TABLET | Freq: Two times a day (BID) | ORAL | Status: DC
  Administered 2014-11-15 – 2014-11-16 (×3): 100 mg via ORAL
  Filled 2014-11-15 (×5): qty 1

## 2014-11-15 MED ORDER — PRO-STAT SUGAR FREE PO LIQD
30.0000 mL | Freq: Three times a day (TID) | ORAL | Status: DC
  Administered 2014-11-16 – 2014-11-19 (×8): 30 mL via ORAL
  Filled 2014-11-15 (×13): qty 30

## 2014-11-15 MED ORDER — RIFAMPIN 300 MG PO CAPS
300.0000 mg | ORAL_CAPSULE | Freq: Two times a day (BID) | ORAL | Status: DC
  Administered 2014-11-15 – 2014-11-19 (×7): 300 mg via ORAL
  Filled 2014-11-15 (×9): qty 1

## 2014-11-15 MED ORDER — HYDROXYZINE HCL 25 MG PO TABS
25.0000 mg | ORAL_TABLET | Freq: Four times a day (QID) | ORAL | Status: DC | PRN
Start: 2014-11-15 — End: 2014-11-19
  Administered 2014-11-16 – 2014-11-18 (×6): 25 mg via ORAL
  Filled 2014-11-15 (×4): qty 1

## 2014-11-15 MED ORDER — B COMPLEX PO TABS: 1.0000 | ORAL_TABLET | Freq: Every day | ORAL | Status: DC

## 2014-11-15 MED ORDER — VITAMIN C 100 MG PO TABS: 100.0000 mg | ORAL_TABLET | Freq: Every day | ORAL | Status: DC

## 2014-11-15 MED ORDER — GABAPENTIN 400 MG PO CAPS
1200.0000 mg | ORAL_CAPSULE | Freq: Two times a day (BID) | ORAL | Status: DC
  Administered 2014-11-15 – 2014-11-19 (×7): 1200 mg via ORAL
  Filled 2014-11-15 (×9): qty 3

## 2014-11-15 MED ORDER — B COMPLEX-C PO TABS
1.0000 | ORAL_TABLET | Freq: Every day | ORAL | Status: DC
  Administered 2014-11-16: 1 via ORAL
  Filled 2014-11-15 (×2): qty 1

## 2014-11-15 MED ORDER — FUROSEMIDE 40 MG PO TABS: 40.0000 mg | ORAL_TABLET | Freq: Two times a day (BID) | ORAL | Status: DC | PRN

## 2014-11-15 MED ORDER — ALUM & MAG HYDROXIDE-SIMETH 200-200-20 MG/5ML PO SUSP: 30.0000 mL | Freq: Four times a day (QID) | ORAL | Status: DC | PRN

## 2014-11-15 MED ORDER — GABAPENTIN 400 MG PO CAPS
400.0000 mg | ORAL_CAPSULE | Freq: Two times a day (BID) | ORAL | Status: DC
  Filled 2014-11-15: qty 2

## 2014-11-15 MED ORDER — OXYMORPHONE HCL ER 20 MG PO TB12
20.0000 mg | ORAL_TABLET | Freq: Three times a day (TID) | ORAL | Status: DC
  Administered 2014-11-15 – 2014-11-17 (×6): 20 mg via ORAL
  Filled 2014-11-15 (×7): qty 1

## 2014-11-15 MED ORDER — SALINE SPRAY 0.65 % NA SOLN
2.0000 | Freq: Every day | NASAL | Status: DC | PRN
  Administered 2014-11-16: 2 via NASAL
  Filled 2014-11-15: qty 44

## 2014-11-15 MED ORDER — SUMATRIPTAN SUCCINATE 100 MG PO TABS: 100.0000 mg | ORAL_TABLET | Freq: Every day | ORAL | Status: DC | PRN

## 2014-11-15 MED ORDER — DARIFENACIN HYDROBROMIDE ER 15 MG PO TB24
15.0000 mg | ORAL_TABLET | Freq: Every day | ORAL | Status: DC
  Administered 2014-11-15 – 2014-11-19 (×4): 15 mg via ORAL
  Filled 2014-11-15 (×6): qty 1

## 2014-11-15 MED ORDER — NICOTINE POLACRILEX 4 MG MT LOZG: 4.0000 mg | LOZENGE | OROMUCOSAL | Status: DC | PRN

## 2014-11-15 MED ORDER — SODIUM CHLORIDE 0.9 % IJ SOLN: 3.0000 mL | INTRAMUSCULAR | Status: DC | PRN

## 2014-11-15 MED ORDER — FERROUS SULFATE 325 (65 FE) MG PO TABS
325.0000 mg | ORAL_TABLET | Freq: Every day | ORAL | Status: DC
Start: 2014-11-16 — End: 2014-11-19
  Administered 2014-11-16 – 2014-11-19 (×3): 325 mg via ORAL
  Filled 2014-11-15 (×6): qty 1

## 2014-11-15 MED ORDER — PANTOPRAZOLE SODIUM 40 MG PO TBEC
40.0000 mg | DELAYED_RELEASE_TABLET | Freq: Every day | ORAL | Status: DC
  Administered 2014-11-16 – 2014-11-19 (×3): 40 mg via ORAL
  Filled 2014-11-15 (×6): qty 1

## 2014-11-15 MED ORDER — ACETAMINOPHEN 325 MG PO TABS: 650.0000 mg | ORAL_TABLET | Freq: Four times a day (QID) | ORAL | Status: DC | PRN

## 2014-11-15 MED ORDER — NALOXEGOL OXALATE 25 MG PO TABS: 25.0000 mg | ORAL_TABLET | Freq: Every day | ORAL | Status: DC

## 2014-11-15 MED ORDER — TRIAMCINOLONE ACETONIDE 0.1 % EX CREA
1.0000 "application " | TOPICAL_CREAM | Freq: Two times a day (BID) | CUTANEOUS | Status: DC
  Administered 2014-11-15 – 2014-11-19 (×6): 1 via TOPICAL
  Filled 2014-11-15 (×3): qty 15

## 2014-11-15 MED ORDER — HYDROCODONE-ACETAMINOPHEN 5-325 MG PO TABS: 1.0000 | ORAL_TABLET | ORAL | Status: DC | PRN

## 2014-11-15 MED ORDER — SODIUM CHLORIDE 0.9 % IJ SOLN
3.0000 mL | Freq: Two times a day (BID) | INTRAMUSCULAR | Status: DC
  Administered 2014-11-15: 3 mL via INTRAVENOUS

## 2014-11-15 MED ORDER — DOCUSATE SODIUM 100 MG PO CAPS
100.0000 mg | ORAL_CAPSULE | Freq: Two times a day (BID) | ORAL | Status: DC
  Administered 2014-11-15 – 2014-11-19 (×5): 100 mg via ORAL

## 2014-11-15 MED ORDER — TRAZODONE HCL 150 MG PO TABS
150.0000 mg | ORAL_TABLET | Freq: Every day | ORAL | Status: DC
  Administered 2014-11-15 – 2014-11-18 (×4): 300 mg via ORAL
  Filled 2014-11-15 (×5): qty 2

## 2014-11-15 MED ORDER — ONDANSETRON HCL 4 MG PO TABS
4.0000 mg | ORAL_TABLET | Freq: Four times a day (QID) | ORAL | Status: DC | PRN
Start: 2014-11-15 — End: 2014-11-17
  Administered 2014-11-16: 4 mg via ORAL
  Filled 2014-11-15: qty 1

## 2014-11-15 NOTE — Telephone Encounter (Signed)
RN spoke with the pt and shared last sed rate and CRP results.  Pt currently in Dr. Deri FuellingAluisio's office and [facility].

## 2014-11-16 ENCOUNTER — Encounter (HOSPITAL_COMMUNITY): Payer: Self-pay | Admitting: *Deleted

## 2014-11-16 DIAGNOSIS — T8452XA Infection and inflammatory reaction due to internal left hip prosthesis, initial encounter: Principal | ICD-10-CM

## 2014-11-16 LAB — CBC
HCT: 26.9 % — ABNORMAL LOW (ref 36.0–46.0)
Hemoglobin: 8.5 g/dL — ABNORMAL LOW (ref 12.0–15.0)
MCH: 29.5 pg (ref 26.0–34.0)
MCHC: 31.6 g/dL (ref 30.0–36.0)
MCV: 93.4 fL (ref 78.0–100.0)
Platelets: 139 10*3/uL — ABNORMAL LOW (ref 150–400)
RBC: 2.88 MIL/uL — ABNORMAL LOW (ref 3.87–5.11)
RDW: 14.7 % (ref 11.5–15.5)
WBC: 2.1 10*3/uL — ABNORMAL LOW (ref 4.0–10.5)

## 2014-11-16 LAB — COMPREHENSIVE METABOLIC PANEL
ALT: 10 U/L (ref 0–35)
AST: 22 U/L (ref 0–37)
Albumin: 2.4 g/dL — ABNORMAL LOW (ref 3.5–5.2)
Alkaline Phosphatase: 88 U/L (ref 39–117)
Anion gap: 6 (ref 5–15)
BUN: 10 mg/dL (ref 6–23)
CO2: 28 mmol/L (ref 19–32)
Calcium: 8.5 mg/dL (ref 8.4–10.5)
Chloride: 102 mEq/L (ref 96–112)
Creatinine, Ser: 0.56 mg/dL (ref 0.50–1.10)
GFR calc Af Amer: >90 mL/min (ref >90)
GFR calc non Af Amer: >90 mL/min (ref >90)
Glucose, Bld: 111 mg/dL — ABNORMAL HIGH (ref 70–99)
Potassium: 4.3 mmol/L (ref 3.5–5.1)
Sodium: 136 mmol/L (ref 135–145)
Total Bilirubin: 0.3 mg/dL (ref 0.3–1.2)
Total Protein: 6.3 g/dL (ref 6.0–8.3)

## 2014-11-16 LAB — PREPARE RBC (CROSSMATCH)

## 2014-11-16 MED ORDER — MIDAZOLAM HCL 2 MG/2ML IJ SOLN
INTRAMUSCULAR | Status: AC
  Filled 2014-11-16: qty 2

## 2014-11-16 MED ORDER — NALOXEGOL OXALATE 25 MG PO TABS
25.0000 mg | ORAL_TABLET | Freq: Every day | ORAL | Status: DC
  Administered 2014-11-18: 25 mg via ORAL

## 2014-11-16 MED ORDER — METHOCARBAMOL 500 MG PO TABS
500.0000 mg | ORAL_TABLET | Freq: Four times a day (QID) | ORAL | Status: DC | PRN
  Administered 2014-11-16: 500 mg via ORAL
  Filled 2014-11-16: qty 1

## 2014-11-16 MED ORDER — ONDANSETRON HCL 4 MG/2ML IJ SOLN
INTRAMUSCULAR | Status: AC
Start: 2014-11-16 — End: 2014-11-16
  Filled 2014-11-16: qty 2

## 2014-11-16 MED ORDER — PROPOFOL 10 MG/ML IV BOLUS
INTRAVENOUS | Status: AC
  Filled 2014-11-16: qty 20

## 2014-11-16 MED ORDER — HYDROMORPHONE HCL 1 MG/ML IJ SOLN
0.5000 mg | INTRAMUSCULAR | Status: DC | PRN
  Administered 2014-11-17 (×4): 1 mg via INTRAVENOUS
  Administered 2014-11-17: 0.5 mg via INTRAVENOUS
  Filled 2014-11-16 (×5): qty 1

## 2014-11-16 MED ORDER — FENTANYL CITRATE 0.05 MG/ML IJ SOLN
INTRAMUSCULAR | Status: AC
  Filled 2014-11-16: qty 5

## 2014-11-16 MED ORDER — DEXAMETHASONE SODIUM PHOSPHATE 10 MG/ML IJ SOLN
INTRAMUSCULAR | Status: AC
  Filled 2014-11-16: qty 1

## 2014-11-16 MED ORDER — SODIUM CHLORIDE 0.9 % IV SOLN
Freq: Once | INTRAVENOUS | Status: AC
  Administered 2014-11-16: 17:00:00 via INTRAVENOUS

## 2014-11-16 MED ORDER — LIDOCAINE HCL (CARDIAC) 20 MG/ML IV SOLN
INTRAVENOUS | Status: AC
  Filled 2014-11-16: qty 5
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
## 2014-11-16 NOTE — Progress Notes (Signed)
Patient ID: [name], female   DOB: 1955-10-29, 59 y.o.   MRN: [id number]         [facility]    Date of Admission:  11/15/2014   Total days of antibiotics 80        Day 22 doxycycline        Day 22 rifampin          Active Problems:   Left hip prosthetic joint infection   . B-complex with vitamin C  1 tablet Oral Daily  . cholecalciferol  800 Units Oral Daily  . darifenacin  15 mg Oral Daily  . docusate sodium  100 mg Oral BID  . doxycycline  100 mg Oral BID  . DULoxetine  60 mg Oral Daily  . feeding supplement (PRO-STAT SUGAR FREE 64)  30 mL Oral TID WC  . ferrous sulfate  325 mg Oral QAC breakfast  . gabapentin  1,200 mg Oral BID  . Naloxegol Oxalate  25 mg Oral QHS  . oxymorphone  20 mg Oral TID  . pantoprazole  40 mg Oral Daily  . rifampin  300 mg Oral BID  . sodium chloride  3 mL Intravenous Q12H  . traZODone  150-300 mg Oral QHS  . triamcinolone cream  1 application Topical BID    Subjective: [name] had a left hip fracture and underwent open reduction internal fixation many years ago. She required a left total hip arthroplasty in 2012. She underwent revision arthroplasty in January of last year. She developed a postoperative MRSA infection in February. She was being treated by orthopedics and infectious disease at [facility] in [place]. She underwent incision and drainage with spacer placement in February. Operative cultures grew MRSA. She was initially treated with vancomycin but records indicate that she developed some renal insufficiency and leukopenia and was switched to daptomycin. A follow-up CT scan in March showed increased abscess formation and a percutaneous drain was replaced. It sounds like she had trouble obtaining daptomycin so she was switched to once weekly dabivancin. She completed 9 weeks of IV antibiotic therapy on April 14. At that time she was switched to oral trimethoprim sulfamethoxazole. She states  that she did not have any problem tolerating her antibiotics but at some point she decided to stop taking the trimethoprim sulfamethoxazole on her own.  She sought a second opinion from Dr. Trudee Grip last fall. This led to her admission to the hospital on October 30 with the plan being revision arthroplasty. However at the time of surgery evidence of persistent infection was found. Dr. Despina Hick replaced the spacer and did further debridement. Operative Gram stain and cultures were negative. She was started back on daptomycin and rifampin was added. She continued on that antibiotic regimen into December. She severed a dislocation of the spacer and was readmitted to the hospital. She underwent repeat I&D on 10/14/2014 and had a temporary Postalac prosthesis placed. Again, operative Gram stain and cultures were negative. She was treated with IV cetaroline and then transition to oral doxycycline and rifampin on discharge which he continues to take. She states that she's had multiple recent falls at home and that an x-ray done in [place] showed the hip was dislocated again.   She states that she had a fever of 102 last Saturday and was seen in [place] and started on cephalexin in addition to her other antibiotics. She does not know if any cultures were done. The only other change she has noted recently  is increased thin drainage from her left hip wound. She has not had any further fever since that time.  Review of Systems: Constitutional: positive for malaise, negative for anorexia, chills, fevers, sweats and weight loss Eyes: negative Ears, nose, mouth, throat, and face: negative Respiratory: negative Cardiovascular: negative Gastrointestinal: positive for constipation, negative for abdominal pain and diarrhea Genitourinary:negative  Past Medical History  Diagnosis Date  . Tenosynovitis of fingers     RIGHT RING AND SMALL-HAD SURGERY 07/12/14 [facility]  . Bronchitis      dx  07-04-2014 (approx) will finished antibiotic 07-14-2014  . History of concussion     x4  --  no residual  . Migraines   . Depression   . 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 GERD (gastroesophageal reflux disease)   . H/O hiatal hernia   . Chronic constipation   . Chronic narcotic use   . Nocturia   . SUI (stress urinary incontinence, female)   . OA (osteoarthritis)     hips, knees, hips  . Infection of left prosthetic hip joint     left total arthroplasty done 2012/  jan 2015 removal of prosthesis  . Full dentures   . History of cellulitis     lower extremities  . Thinning of skin     easily tears  . Venous stasis dermatitis     BILATERAL LEGS  . Productive cough     RESOLVED - QUIT SMOKING  . Memory problem   . OSA on CPAP     study x3   last one few yrs ago  cpap recommended -- DOES NOT USE OR HAVE CPAP    History  Substance Use Topics  . Smoking status: Former Smoker -- 1.00 packs/day for 30 years    Types: Cigarettes    Quit date: 07/09/2014  . Smokeless tobacco: Never Used     Comment: PT STATES ATTEMPTING TO QUIT SMOKING 07-09-2014--  USING NICOTIN LOZENGERS  . Alcohol Use: No    Family History  Problem Relation Age of Onset  . COPD Mother   . Hypertension Father   . Pancreatic cancer Father   . COPD Brother    Allergies  Allergen Reactions  . Vancomycin Other (See Comments)    LEUKOPENIA    OBJECTIVE: Blood pressure 121/55, pulse 82, temperature 98.7 F (37.1 C), temperature source Oral, resp. rate 18, height  (1.778 m), weight 239 lb (108.41 kg), SpO2 97 %. General: She is in no distress In a chair  Skin: No rash  Lungs: Clear  Cor: Regular S1 and S2 with no murmur  Abdomen: Obese, soft and nontender She has thin serosanguineous drainage on her left hip dressing. There is no odor. She has no cellulitis or fluctuance.   Lab Results Lab Results  Component  Value Date   WBC 2.1* 11/16/2014   HGB 8.5* 11/16/2014   HCT 26.9* 11/16/2014   MCV 93.4 11/16/2014   PLT 139* 11/16/2014    Lab Results  Component Value Date   CREATININE 0.56 11/16/2014   BUN 10 11/16/2014   NA 136 11/16/2014   K 4.3 11/16/2014   CL 102 11/16/2014   CO2 28 11/16/2014    Lab Results  Component Value Date   ALT 10 11/16/2014   AST 22 11/16/2014   ALKPHOS 88 11/16/2014   BILITOT 0.3 11/16/2014  Microbiology: No results found for this or any previous visit (from the past 240 hour(s)).  Assessment: She has very poor wound healing but I am not sure that there is any active infection. I will continue her current antibiotics pending a decision about further surgery. Obviously, if she does have further surgery and would request repeat operative specimens for stain and culture.   Plan: 1. Continue doxycycline and rifampin for now  2. Please call Dr. Enedina FinnerJeff Hatcher [phone]) for any infectious disease questions this weekend    Cliffton AstersJohn Airyn Ellzey, MD [facility] [phone] pager   [phone] cell 11/16/2014, 11:43 AM

## 2014-11-16 NOTE — Progress Notes (Signed)
Orders noted to continue naloxegol (Movantik) as taken at home to prevent opioid-induced constipation.  This is a non-formulary medication and patient will need to use home supply.  Discussed with patient.  She states that family will not be able to bring this in today due to the inclement weather.  We reviewed her medication list and noted she has an order for Miralax daily PRN.   Encouraged her to request RN administer the PRN Miralax each day while off naloxegol unless BMs become excessive.  Elie Goodyandy Rainah Kirshner, PharmD, BCPS Pager: [phone] 11/16/2014  7:38 AM

## 2014-11-16 NOTE — Anesthesia Preprocedure Evaluation (Addendum)
Anesthesia Evaluation  Patient identified by MRN, date of birth, ID band Patient awake    Reviewed: Allergy & Precautions, H&P , NPO status , Patient's Chart, lab work & pertinent test results  Airway Mallampati: III  TM Distance: >3 FB Neck ROM: Full    Dental  (+) Lower Dentures, Upper Dentures, Dental Advisory Given   Pulmonary sleep apnea , former smoker,  CXR: No acute disease. Mild cardiomegaly. breath sounds clear to auscultation  Pulmonary exam normal       Cardiovascular negative cardio ROS  Rhythm:Regular Rate:Normal     Neuro/Psych  Headaches, PSYCHIATRIC DISORDERS Depression    GI/Hepatic Neg liver ROS, hiatal hernia, GERD-  Medicated,  Endo/Other  negative endocrine ROS  Renal/GU negative Renal ROS  negative genitourinary   Musculoskeletal  (+) Arthritis -,   Abdominal (+) + obese,   Peds negative pediatric ROS (+)  Hematology negative hematology ROS (+) anemia , hgb 10.7   Anesthesia Other Findings   Reproductive/Obstetrics negative OB ROS                             Anesthesia Physical Anesthesia Plan  ASA: III  Anesthesia Plan: General   Post-op Pain Management:    Induction: Intravenous  Airway Management Planned: Oral ETT  Additional Equipment:   Intra-op Plan:   Post-operative Plan: Extubation in OR  Informed Consent:   Plan Discussed with: Surgeon  Anesthesia Plan Comments:         Anesthesia Quick Evaluation  

## 2014-11-16 NOTE — Progress Notes (Signed)
Subjective: Left hip prosthetic infection  She is having some pain. Some issues with muscle spasms. NO SOB or chest pain. She would like to be able to get out of bed and into a chair.   Objective: Vital signs in last 24 hours: Temp:  [98.2 F (36.8 C)] 98.2 F (36.8 C) (01/21 2158) Pulse Rate:  [85] 85 (01/21 2158) Resp:  [18] 18 (01/21 2158) BP: (136)/(56) 136/56 mmHg (01/21 2158) SpO2:  [98 %] 98 % (01/21 2158)  Intake/Output from previous day: 01/21 0701 - 01/22 0700 In: 502.5 [P.O.:240; I.V.:262.5] Out: 875 [Urine:875] Intake/Output this shift: Total I/O In: 262.5 [I.V.:262.5] Out: 675 [Urine:675]   Recent Labs  11/15/14 1913 11/16/14 0520  HGB 9.9* 8.5*    Recent Labs  11/15/14 1913 11/16/14 0520  WBC 2.3* 2.1*  RBC 3.29* 2.88*  HCT 30.2* 26.9*  PLT 138* 139*    Recent Labs  11/15/14 1913 11/16/14 0520  NA 134* 136  K 3.8 4.3  CL 98 102  CO2 31 28  BUN 9 10  CREATININE 0.68 0.56  GLUCOSE 107* 111*  CALCIUM 8.7 8.5    Recent Labs  11/15/14 1913  INR 0.95    Neurologically intact Intact pulses distally Dorsiflexion/Plantar flexion intact Incision: moderate drainage  Assessment/Plan: Left hip prosthetic infection  She is doing fair this morning. Will add Robaxin. Will give her wheelchair privileges but she was advised that she cannot leave the floor. Possible OR this weekend.    Louie Flenner LAUREN 11/16/2014, 6:29 AM

## 2014-11-17 ENCOUNTER — Inpatient Hospital Stay (HOSPITAL_COMMUNITY): Payer: Medicare Other | Admitting: Anesthesiology

## 2014-11-17 ENCOUNTER — Encounter (HOSPITAL_COMMUNITY)
Admission: AD | Disposition: A | Discharge status: Home Health | Payer: Self-pay | Source: Home / Self Care | Attending: Orthopedic Surgery

## 2014-11-17 ENCOUNTER — Encounter (HOSPITAL_COMMUNITY): Payer: Self-pay | Admitting: Certified Registered Nurse Anesthetist

## 2014-11-17 HISTORY — PX: REIMPLANTATION OF CEMENT SPACER HIP: SHX6049

## 2014-11-17 LAB — SURGICAL PCR SCREEN
MRSA, PCR: NEGATIVE
STAPHYLOCOCCUS AUREUS: NEGATIVE

## 2014-11-17 SURGERY — REIMPLANTATION OF CEMENT SPACER HIP
Anesthesia: General | Site: Hip | Laterality: Left

## 2014-11-17 MED ORDER — NEOSTIGMINE METHYLSULFATE 10 MG/10ML IV SOLN
INTRAVENOUS | Status: DC | PRN
Start: 2014-11-17 — End: 2014-11-17
  Administered 2014-11-17: 4 mg via INTRAVENOUS

## 2014-11-17 MED ORDER — SUCCINYLCHOLINE CHLORIDE 20 MG/ML IJ SOLN
INTRAMUSCULAR | Status: DC | PRN
  Administered 2014-11-17: 100 mg via INTRAVENOUS

## 2014-11-17 MED ORDER — GLYCOPYRROLATE 0.2 MG/ML IJ SOLN
INTRAMUSCULAR | Status: AC
  Filled 2014-11-17: qty 3

## 2014-11-17 MED ORDER — 0.9 % SODIUM CHLORIDE (POUR BTL) OPTIME
TOPICAL | Status: DC | PRN
  Administered 2014-11-17: 1000 mL

## 2014-11-17 MED ORDER — DIPHENHYDRAMINE HCL 12.5 MG/5ML PO ELIX: 12.5000 mg | ORAL_SOLUTION | ORAL | Status: DC | PRN

## 2014-11-17 MED ORDER — CEFAZOLIN SODIUM-DEXTROSE 2-3 GM-% IV SOLR
INTRAVENOUS | Status: DC | PRN
  Administered 2014-11-17: 2 g via INTRAVENOUS

## 2014-11-17 MED ORDER — LIDOCAINE HCL (CARDIAC) 20 MG/ML IV SOLN
INTRAVENOUS | Status: AC
  Filled 2014-11-17: qty 5

## 2014-11-17 MED ORDER — HYDROMORPHONE HCL 2 MG/ML IJ SOLN
INTRAMUSCULAR | Status: AC
  Filled 2014-11-17: qty 1

## 2014-11-17 MED ORDER — METOCLOPRAMIDE HCL 5 MG PO TABS
5.0000 mg | ORAL_TABLET | Freq: Three times a day (TID) | ORAL | Status: DC | PRN
  Administered 2014-11-18: 10 mg via ORAL
  Filled 2014-11-17 (×3): qty 2

## 2014-11-17 MED ORDER — DEXAMETHASONE SODIUM PHOSPHATE 10 MG/ML IJ SOLN
INTRAMUSCULAR | Status: DC | PRN
  Administered 2014-11-17: 10 mg via INTRAVENOUS

## 2014-11-17 MED ORDER — DEXAMETHASONE SODIUM PHOSPHATE 10 MG/ML IJ SOLN
INTRAMUSCULAR | Status: AC
  Filled 2014-11-17: qty 1

## 2014-11-17 MED ORDER — ACETAMINOPHEN 650 MG RE SUPP: 650.0000 mg | Freq: Four times a day (QID) | RECTAL | Status: DC | PRN

## 2014-11-17 MED ORDER — HYDROMORPHONE HCL 1 MG/ML IJ SOLN
INTRAMUSCULAR | Status: DC | PRN
  Administered 2014-11-17 (×2): 1 mg via INTRAVENOUS

## 2014-11-17 MED ORDER — PHENOL 1.4 % MT LIQD: 1.0000 | OROMUCOSAL | Status: DC | PRN

## 2014-11-17 MED ORDER — MORPHINE SULFATE 10 MG/ML IJ SOLN: 1.0000 mg | INTRAMUSCULAR | Status: DC | PRN

## 2014-11-17 MED ORDER — ROCURONIUM BROMIDE 100 MG/10ML IV SOLN
INTRAVENOUS | Status: AC
  Filled 2014-11-17: qty 1

## 2014-11-17 MED ORDER — POLYETHYLENE GLYCOL 3350 17 G PO PACK: 17.0000 g | PACK | Freq: Every day | ORAL | Status: DC | PRN

## 2014-11-17 MED ORDER — BISACODYL 10 MG RE SUPP: 10.0000 mg | Freq: Every day | RECTAL | Status: DC | PRN

## 2014-11-17 MED ORDER — GLYCOPYRROLATE 0.2 MG/ML IJ SOLN
INTRAMUSCULAR | Status: DC | PRN
  Administered 2014-11-17: 0.6 mg via INTRAVENOUS

## 2014-11-17 MED ORDER — SODIUM CHLORIDE 0.9 % IR SOLN
Status: DC | PRN
  Administered 2014-11-17: 3000 mL

## 2014-11-17 MED ORDER — DEXAMETHASONE SODIUM PHOSPHATE 10 MG/ML IJ SOLN
10.0000 mg | Freq: Once | INTRAMUSCULAR | Status: DC
  Filled 2014-11-17: qty 1

## 2014-11-17 MED ORDER — HYDROMORPHONE HCL 1 MG/ML IJ SOLN
INTRAMUSCULAR | Status: AC
  Filled 2014-11-17: qty 1

## 2014-11-17 MED ORDER — STERILE WATER FOR IRRIGATION IR SOLN
Status: DC | PRN
  Administered 2014-11-17: 1500 mL

## 2014-11-17 MED ORDER — NEOSTIGMINE METHYLSULFATE 10 MG/10ML IV SOLN
INTRAVENOUS | Status: AC
  Filled 2014-11-17: qty 1

## 2014-11-17 MED ORDER — MIDAZOLAM HCL 2 MG/2ML IJ SOLN
INTRAMUSCULAR | Status: AC
  Filled 2014-11-17: qty 2

## 2014-11-17 MED ORDER — HYDROMORPHONE HCL 2 MG/ML IJ SOLN
INTRAMUSCULAR | Status: AC
Start: 2014-11-17 — End: 2014-11-17
  Filled 2014-11-17: qty 1

## 2014-11-17 MED ORDER — MIDAZOLAM HCL 5 MG/5ML IJ SOLN
INTRAMUSCULAR | Status: DC | PRN
  Administered 2014-11-17: 2 mg via INTRAVENOUS

## 2014-11-17 MED ORDER — ASPIRIN EC 325 MG PO TBEC
325.0000 mg | DELAYED_RELEASE_TABLET | Freq: Every day | ORAL | Status: DC
  Administered 2014-11-18 – 2014-11-19 (×2): 325 mg via ORAL
  Filled 2014-11-17 (×4): qty 1

## 2014-11-17 MED ORDER — FLEET ENEMA 7-19 GM/118ML RE ENEM: 1.0000 | ENEMA | Freq: Once | RECTAL | Status: AC | PRN

## 2014-11-17 MED ORDER — LACTATED RINGERS IV SOLN
INTRAVENOUS | Status: DC | PRN
  Administered 2014-11-17 (×2): via INTRAVENOUS

## 2014-11-17 MED ORDER — ONDANSETRON HCL 4 MG/2ML IJ SOLN
INTRAMUSCULAR | Status: AC
  Filled 2014-11-17: qty 2

## 2014-11-17 MED ORDER — MENTHOL 3 MG MT LOZG: 1.0000 | LOZENGE | OROMUCOSAL | Status: DC | PRN

## 2014-11-17 MED ORDER — HYDROMORPHONE HCL 1 MG/ML IJ SOLN
0.2500 mg | INTRAMUSCULAR | Status: DC | PRN
  Administered 2014-11-17 (×4): 0.5 mg via INTRAVENOUS

## 2014-11-17 MED ORDER — LACTATED RINGERS IV SOLN: INTRAVENOUS | Status: DC

## 2014-11-17 MED ORDER — DEXTROSE 5 % IV SOLN
500.0000 mg | Freq: Four times a day (QID) | INTRAVENOUS | Status: DC | PRN
  Filled 2014-11-17: qty 5

## 2014-11-17 MED ORDER — ONDANSETRON HCL 4 MG/2ML IJ SOLN
INTRAMUSCULAR | Status: DC | PRN
  Administered 2014-11-17: 4 mg via INTRAVENOUS

## 2014-11-17 MED ORDER — CEFAZOLIN SODIUM-DEXTROSE 2-3 GM-% IV SOLR
INTRAVENOUS | Status: AC
  Filled 2014-11-17: qty 50

## 2014-11-17 MED ORDER — ONDANSETRON HCL 4 MG PO TABS
4.0000 mg | ORAL_TABLET | Freq: Four times a day (QID) | ORAL | Status: DC | PRN
  Administered 2014-11-18 – 2014-11-19 (×2): 4 mg via ORAL
  Filled 2014-11-17 (×2): qty 1

## 2014-11-17 MED ORDER — HYDROMORPHONE HCL 1 MG/ML IJ SOLN
INTRAMUSCULAR | Status: AC
  Filled 2014-11-17: qty 2

## 2014-11-17 MED ORDER — MIDAZOLAM HCL 2 MG/2ML IJ SOLN
1.0000 mg | INTRAMUSCULAR | Status: DC | PRN
Start: 2014-11-17 — End: 2014-11-17
  Administered 2014-11-17: 1 mg via INTRAVENOUS

## 2014-11-17 MED ORDER — METHOCARBAMOL 1000 MG/10ML IJ SOLN
500.0000 mg | Freq: Once | INTRAVENOUS | Status: AC
  Administered 2014-11-17: 500 mg via INTRAVENOUS
  Filled 2014-11-17: qty 5

## 2014-11-17 MED ORDER — CEFAZOLIN SODIUM-DEXTROSE 2-3 GM-% IV SOLR
2.0000 g | Freq: Four times a day (QID) | INTRAVENOUS | Status: AC
  Administered 2014-11-17 (×2): 2 g via INTRAVENOUS
  Filled 2014-11-17 (×2): qty 50

## 2014-11-17 MED ORDER — METHOCARBAMOL 500 MG PO TABS
500.0000 mg | ORAL_TABLET | Freq: Four times a day (QID) | ORAL | Status: DC | PRN
  Administered 2014-11-17 – 2014-11-19 (×4): 500 mg via ORAL
  Filled 2014-11-17 (×4): qty 1

## 2014-11-17 MED ORDER — ROCURONIUM BROMIDE 100 MG/10ML IV SOLN
INTRAVENOUS | Status: DC | PRN
  Administered 2014-11-17: 20 mg via INTRAVENOUS
  Administered 2014-11-17: 10 mg via INTRAVENOUS
  Administered 2014-11-17: 20 mg via INTRAVENOUS

## 2014-11-17 MED ORDER — HYDROMORPHONE HCL 1 MG/ML IJ SOLN
0.5000 mg | INTRAMUSCULAR | Status: DC | PRN
  Administered 2014-11-17 (×3): 0.5 mg via INTRAVENOUS

## 2014-11-17 MED ORDER — PROPOFOL 10 MG/ML IV BOLUS
INTRAVENOUS | Status: DC | PRN
  Administered 2014-11-17: 170 mg via INTRAVENOUS

## 2014-11-17 MED ORDER — ONDANSETRON HCL 4 MG/2ML IJ SOLN: 4.0000 mg | Freq: Four times a day (QID) | INTRAMUSCULAR | Status: DC | PRN

## 2014-11-17 MED ORDER — LIDOCAINE HCL (CARDIAC) 20 MG/ML IV SOLN
INTRAVENOUS | Status: DC | PRN
  Administered 2014-11-17: 100 mg via INTRAVENOUS

## 2014-11-17 MED ORDER — FENTANYL CITRATE 0.05 MG/ML IJ SOLN
INTRAMUSCULAR | Status: DC | PRN
  Administered 2014-11-17 (×2): 100 ug via INTRAVENOUS

## 2014-11-17 MED ORDER — METOCLOPRAMIDE HCL 5 MG/ML IJ SOLN: 5.0000 mg | Freq: Three times a day (TID) | INTRAMUSCULAR | Status: DC | PRN

## 2014-11-17 MED ORDER — ACETAMINOPHEN 325 MG PO TABS
650.0000 mg | ORAL_TABLET | Freq: Four times a day (QID) | ORAL | Status: DC | PRN
  Administered 2014-11-19: 650 mg via ORAL
  Filled 2014-11-17: qty 2

## 2014-11-17 SURGICAL SUPPLY — 57 items
BAG ZIPLOCK 12X15 (MISCELLANEOUS) ×9 IMPLANT
BIT DRILL 2.8X128 (BIT) ×2 IMPLANT
BIT DRILL 2.8X128MM (BIT) ×1
BLADE EXTENDED COATED 6.5IN (ELECTRODE) ×3 IMPLANT
BONE CEMENT GENTAMICIN (Cement) ×6 IMPLANT
BOWL SMART MIX CTS (DISPOSABLE) ×6 IMPLANT
CEMENT BONE GENTAMICIN 40 (Cement) ×2 IMPLANT
DRAPE INCISE IOBAN 66X45 STRL (DRAPES) ×6 IMPLANT
DRAPE ORTHO SPLIT 77X108 STRL (DRAPES) ×4
DRAPE POUCH INSTRU U-SHP 10X18 (DRAPES) ×3 IMPLANT
DRAPE SURG ORHT 6 SPLT 77X108 (DRAPES) ×2 IMPLANT
DRAPE U-SHAPE 47X51 STRL (DRAPES) ×3 IMPLANT
DRSG ADAPTIC 3X8 NADH LF (GAUZE/BANDAGES/DRESSINGS) ×3 IMPLANT
DRSG EMULSION OIL 3X16 NADH (GAUZE/BANDAGES/DRESSINGS) ×3 IMPLANT
DRSG MEPILEX BORDER 4X12 (GAUZE/BANDAGES/DRESSINGS) ×3 IMPLANT
DRSG MEPILEX BORDER 4X4 (GAUZE/BANDAGES/DRESSINGS) ×3 IMPLANT
DRSG MEPILEX BORDER 4X8 (GAUZE/BANDAGES/DRESSINGS) ×3 IMPLANT
DURAPREP 26ML APPLICATOR (WOUND CARE) ×6 IMPLANT
ELECT REM PT RETURN 9FT ADLT (ELECTROSURGICAL) ×3
ELECTRODE REM PT RTRN 9FT ADLT (ELECTROSURGICAL) ×1 IMPLANT
EVACUATOR 1/8 PVC DRAIN (DRAIN) ×6 IMPLANT
FACESHIELD WRAPAROUND (MASK) ×12 IMPLANT
GAUZE SPONGE 4X4 12PLY STRL (GAUZE/BANDAGES/DRESSINGS) ×3 IMPLANT
GLOVE BIO SURGEON STRL SZ7.5 (GLOVE) ×3 IMPLANT
GLOVE BIO SURGEON STRL SZ8 (GLOVE) ×3 IMPLANT
GLOVE BIOGEL PI IND STRL 8 (GLOVE) ×3 IMPLANT
GLOVE BIOGEL PI INDICATOR 8 (GLOVE) ×6
GOWN STRL REUS W/TWL LRG LVL3 (GOWN DISPOSABLE) ×3 IMPLANT
GOWN STRL REUS W/TWL XL LVL3 (GOWN DISPOSABLE) ×3 IMPLANT
HANDPIECE INTERPULSE COAX TIP (DISPOSABLE) ×2
HEAD FEM STD 32X+5 STRL (Hips) ×3 IMPLANT
IMMOBILIZER KNEE 20 (SOFTGOODS)
IMMOBILIZER KNEE 20 THIGH 36 (SOFTGOODS) IMPLANT
KIT BASIN OR (CUSTOM PROCEDURE TRAY) ×3 IMPLANT
LINER ACET CUP 42MMX32MM (Hips) ×3 IMPLANT
MANIFOLD NEPTUNE II (INSTRUMENTS) ×3 IMPLANT
NDL SAFETY ECLIPSE 18X1.5 (NEEDLE) IMPLANT
NEEDLE HYPO 18GX1.5 SHARP (NEEDLE)
NS IRRIG 1000ML POUR BTL (IV SOLUTION) ×3 IMPLANT
PACK TOTAL JOINT (CUSTOM PROCEDURE TRAY) ×3 IMPLANT
PADDING CAST COTTON 6X4 STRL (CAST SUPPLIES) ×6 IMPLANT
POSITIONER SURGICAL ARM (MISCELLANEOUS) ×6 IMPLANT
SET HNDPC FAN SPRY TIP SCT (DISPOSABLE) ×1 IMPLANT
SPONGE LAP 18X18 X RAY DECT (DISPOSABLE) ×9 IMPLANT
STAPLER VISISTAT 35W (STAPLE) ×3 IMPLANT
SUT ETHIBOND NAB CT1 #1 30IN (SUTURE) ×6 IMPLANT
SUT VIC AB 1 CT1 27 (SUTURE) ×6
SUT VIC AB 1 CT1 27XBRD ANTBC (SUTURE) ×3 IMPLANT
SUT VIC AB 2-0 CT1 27 (SUTURE) ×8
SUT VIC AB 2-0 CT1 TAPERPNT 27 (SUTURE) ×4 IMPLANT
SUT VLOC 180 0 24IN GS25 (SUTURE) ×6 IMPLANT
SYR 50ML LL SCALE MARK (SYRINGE) IMPLANT
TOWEL OR 17X26 10 PK STRL BLUE (TOWEL DISPOSABLE) ×6 IMPLANT
TOWER CARTRIDGE SMART MIX (DISPOSABLE) IMPLANT
TRAY FOLEY CATH 14FRSI W/METER (CATHETERS) ×3 IMPLANT
WATER STERILE IRR 1500ML POUR (IV SOLUTION) ×3 IMPLANT
YANKAUER SUCT BULB TIP 10FT TU (MISCELLANEOUS) ×3 IMPLANT

## 2014-11-17 NOTE — Op Note (Signed)
NAMReine Just:  Birkel, Sianni            ACCOUNT NO.:  [id number]  MEDICAL RECORD NO.:  [id number]  LOCATION:  [facility]                         [facility]  PHYSICIAN:  Ollen GrossFrank Shaneequa Bahner, M.D.    DATE OF BIRTH:  September 02, 1956  DATE OF PROCEDURE:  11/17/2014 DATE OF DISCHARGE:                              OPERATIVE REPORT   PREOPERATIVE DIAGNOSIS:  Infected left hip.  POSTOPERATIVE DIAGNOSIS:  Infected left hip.  PROCEDURE:  Irrigation and debridement of left hip with reimplantation of a cement spacer.  SURGEON:  Ollen GrossFrank Lilyona Richner, M.D.  ASSISTANT:  Alexzandrew L. Perkins, PA-C.  ANESTHESIA:  General.  ESTIMATED BLOOD LOSS:  600.  DRAINS:  Hemovac x1.  COMPLICATIONS:  None.  CONDITION:  Stable to recovery.  BRIEF CLINICAL NOTE:  [name] is a 59 year old female, long complex history in regards to her left hip.  She has had a previous hip infection with resection arthroplasty and had 2 other irrigation, debridements, and spacers placed.  She recently had a fall and dislodged the acetabular component of the spacer.  She presents now for revision spacer, irrigation and debridement of the hip.  PROCEDURE IN DETAIL:  After successful administration of general anesthetic, the patient was placed in the right lateral decubitus position with the left side up and held with the hip positioner.  The left lower extremity was isolated from her perineum with plastic drapes and prepped and draped in the usual sterile fashion.  Posterolateral incision was made with a 10 blade through the subcutaneous tissue. There was evidence of hematoma subcutaneously as well as deep.  We sent the fluid for Gram stain, culture and sensitivity.  The fascia lata was then incised in line with the skin incision.  Further hematoma was identified.  We thoroughly irrigated with saline with pulsatile lavage. The dislodged acetabular component was identified and removed.  I then removed the femoral head from the femoral  component.  This remained well fixed.  We retracted the femur anteriorly to gain acetabular exposure. I removed all abnormal tissue from her native acetabulum.  From her previous acetabular fracture, she had some posterior column defect. Overall, there was no discontinuity.  The acetabulum remains contiguous. We thoroughly irrigated this area with pulsatile lavage with saline. Then, mixed the batch of gentamicin impregnated cement and cemented a Prostalac all poly acetabular component into the acetabulum.  Extruded cement was removed.  A 32+ 5 femoral head was then placed on to the femoral neck.  We reduced and had the snap fit.  Wound was then further irrigated for a total of 3 L of irrigation.  The tissue appeared much healthier after the debridement.  Fascia lata was then closed over Hemovac drain with a running #1 V-Loc suture.  Subcu was also closed over Hemovac drain with a running #1 V-Loc suture,  interrupted 2-0 Vicryl and staples for the skin.  Both drains were hooked to suction. Incision cleaned and dried and a bulky sterile dressing applied.  She was awakened and transported to recovery in stable condition.     Ollen GrossFrank Emilynn Srinivasan, M.D.     FA/MEDQ  D:  11/17/2014  T:  11/17/2014  Job:  [id number]

## 2014-11-17 NOTE — Brief Op Note (Signed)
11/15/2014 - 11/17/2014  9:39 AM  PATIENT:  [name]  59 y.o. female  PRE-OPERATIVE DIAGNOSIS:  infected left hip  POST-OPERATIVE DIAGNOSIS:  infected left hip  PROCEDURE:  Procedure(s): IRRIGATION AND DEBRIDEMENT AND REIMPLANTATION OF CEMENT SPACER HIP (Left)  SURGEON:  Surgeon(s) and Role:    * Loanne DrillingFrank Mikia Delaluz V, MD - Primary  PHYSICIAN ASSISTANT:   ASSISTANTS: Avel Peacerew Perkins, PA-C   ANESTHESIA:   general  EBL:  Total I/O In: 1000 [I.V.:1000] Out: 1100 [Urine:500; Blood:600]  BLOOD ADMINISTERED:none  DRAINS: (Medium) Hemovact drain(s) in the left hip with  Suction Open   LOCAL MEDICATIONS USED:  NONE  COUNTS:  YES  TOURNIQUET:  * No tourniquets in log *  DICTATION: .Other Dictation: Dictation Number [id number]  PLAN OF CARE: Admit to inpatient   PATIENT DISPOSITION:  PACU - hemodynamically stable.

## 2014-11-17 NOTE — Progress Notes (Signed)
Attempt iv restart x1.  Pt. Refused further attempt.  Staff RN aware. To notify MD.

## 2014-11-17 NOTE — Interval H&P Note (Signed)
History and Physical Interval Note:  11/17/2014 7:33 AM  [name]  has presented today for surgery, with the diagnosis of infected left hip  The various methods of treatment have been discussed with the patient and family. After consideration of risks, benefits and other options for treatment, the patient has consented to  Procedure(s): REIMPLANTATION OF CEMENT SPACER HIP (Left) as a surgical intervention .  The patient's history has been reviewed, patient examined, no change in status, stable for surgery.  I have reviewed the patient's chart and labs.  Questions were answered to the patient's satisfaction.     Loanne DrillingALUISIO,Blair Lundeen V

## 2014-11-17 NOTE — Anesthesia Procedure Notes (Signed)
Procedure Name: Intubation Date/Time: 11/17/2014 7:56 AM Performed by: Carolyne FiscalINMAN, [name] Pre-anesthesia Checklist: Patient identified, Emergency Drugs available, Suction available, Patient being monitored and Timeout performed Patient Re-evaluated:Patient Re-evaluated prior to inductionOxygen Delivery Method: Circle system utilized Preoxygenation: Pre-oxygenation with 100% oxygen Intubation Type: IV induction Ventilation: Mask ventilation without difficulty Laryngoscope Size: Miller and 2 Grade View: Grade I Tube type: Oral Tube size: 7.0 mm Number of attempts: 1 Airway Equipment and Method: Stylet Placement Confirmation: ETT inserted through vocal cords under direct vision,  positive ETCO2 and breath sounds checked- equal and bilateral Secured at: 20 cm Tube secured with: Tape Dental Injury: Teeth and Oropharynx as per pre-operative assessment

## 2014-11-17 NOTE — Anesthesia Postprocedure Evaluation (Signed)
  Anesthesia Post-op Note  Patient: [name]  Procedure(s) Performed: Procedure(s) (LRB): IRRIGATION AND DEBRIDEMENT AND REIMPLANTATION OF CEMENT SPACER HIP (Left)  Patient Location: PACU  Anesthesia Type: General  Level of Consciousness: awake and alert   Airway and Oxygen Therapy: Patient Spontanous Breathing  Post-op Pain: mild  Post-op Assessment: Post-op Vital signs reviewed, Patient's Cardiovascular Status Stable, Respiratory Function Stable, Patent Airway and No signs of Nausea or vomiting  Last Vitals:  Filed Vitals:   11/17/14 1100  BP: 165/75  Pulse: 83  Temp: 36.4 C  Resp: 12    Post-op Vital Signs: stable   Complications: No apparent anesthesia complications

## 2014-11-17 NOTE — Transfer of Care (Signed)
Immediate Anesthesia Transfer of Care Note  Patient: [name]  Procedure(s) Performed: Procedure(s): IRRIGATION AND DEBRIDEMENT AND REIMPLANTATION OF CEMENT SPACER HIP (Left)  Patient Location: PACU  Anesthesia Type:General  Level of Consciousness: awake, alert  and oriented  Airway & Oxygen Therapy: Patient Spontanous Breathing and Patient connected to face mask oxygen  Post-op Assessment: Report given to PACU RN and Post -op Vital signs reviewed and stable  Post vital signs: Reviewed and stable  Complications: No apparent anesthesia complications

## 2014-11-18 LAB — TYPE AND SCREEN
ABO/RH(D): O POS
Antibody Screen: POSITIVE
DAT, IgG: NEGATIVE
Donor AG Type: NEGATIVE
Donor AG Type: NEGATIVE
UNIT DIVISION: 0
Unit division: 0

## 2014-11-18 LAB — CBC
HEMATOCRIT: 29.2 % — AB (ref 36.0–46.0)
HEMOGLOBIN: 9.4 g/dL — AB (ref 12.0–15.0)
MCH: 29.7 pg (ref 26.0–34.0)
MCHC: 32.2 g/dL (ref 30.0–36.0)
MCV: 92.1 fL (ref 78.0–100.0)
Platelets: 163 10*3/uL (ref 150–400)
RBC: 3.17 MIL/uL — AB (ref 3.87–5.11)
RDW: 15.5 % (ref 11.5–15.5)
WBC: 3.9 10*3/uL — AB (ref 4.0–10.5)

## 2014-11-18 LAB — BASIC METABOLIC PANEL
ANION GAP: 5 (ref 5–15)
BUN: 13 mg/dL (ref 6–23)
CO2: 25 mmol/L (ref 19–32)
Calcium: 8.1 mg/dL — ABNORMAL LOW (ref 8.4–10.5)
Chloride: 103 mmol/L (ref 96–112)
Creatinine, Ser: 0.62 mg/dL (ref 0.50–1.10)
GFR calc Af Amer: >90 mL/min (ref >90)
GFR calc non Af Amer: >90 mL/min (ref >90)
GLUCOSE: 113 mg/dL — AB (ref 70–99)
Potassium: 4.4 mmol/L (ref 3.5–5.1)
SODIUM: 133 mmol/L — AB (ref 135–145)

## 2014-11-18 MED ORDER — OXYMORPHONE HCL ER 20 MG PO TB12
20.0000 mg | ORAL_TABLET | Freq: Three times a day (TID) | ORAL | Status: DC
  Administered 2014-11-18 – 2014-11-19 (×4): 20 mg via ORAL
  Filled 2014-11-18 (×4): qty 1

## 2014-11-18 NOTE — Evaluation (Signed)
Physical Therapy Evaluation Patient Details Name: [name] MRN: [id number] DOB: July 07, 1956 Today's Date: 11/18/2014   History of Present Illness  Antibiotic spacer revision 11/17/14 . H/O dislocation.  I&D and replacement of spacer 10/14/14. Antibiotic spacer L hip and ORIF L femur s/p spiral fx 08/24/14;    Clinical Impression  Patient mobilizing t bed to recliner with min assist. Patient expresses concern for transport home in her [name]. Referred   Patient to discuss with Dr. Despina HickAlusio, preference for transportation. Patient will benefit from PT to address problems listed in note below.    Follow Up Recommendations Home health PT;Supervision/Assistance - 24 hour    Equipment Recommendations  None recommended by PT    Recommendations for Other Services       Precautions / Restrictions Precautions Precautions: Fall;Posterior Hip Precaution Comments: transfetrs only, no ambulation, frequent cues for  hip precautions Restrictions Weight Bearing Restrictions: No Other Position/Activity Restrictions: WBAT      Mobility  Bed Mobility Overal bed mobility: Needs Assistance Bed Mobility: Supine to Sit     Supine to sit: HOB elevated;Mod assist     General bed mobility comments: support LLE , assist to lowere to floor.  Transfers Overall transfer level: Needs assistance Equipment used: Rolling walker (2 wheeled) Transfers: Sit to/from UGI CorporationStand;Stand Pivot Transfers Sit to Stand: Min assist;From elevated surface Stand pivot transfers: Min assist;From elevated surface       General transfer comment:  raised bed  to assist standing, able to turn to recliner, support LLE as pt sits down.  Ambulation/Gait                Stairs            Wheelchair Mobility    Modified Rankin (Stroke Patients Only)       Balance                                             Pertinent Vitals/Pain Pain Assessment: 0-10 Pain Score: 6  Pain Location: L  hip Pain Descriptors / Indicators: Aching Pain Intervention(s): Premedicated before session;Repositioned;Monitored during session    Home Living Family/patient expects to be discharged to:: Private residence Living Arrangements: Spouse/significant other Available Help at Discharge: Available PRN/intermittently Type of Home: House Home Access: Ramped entrance     Home Layout: One level Home Equipment: Environmental consultantWalker - 2 wheels;Hospital bed;Wheelchair - power Additional Comments: Pt has a reacher, sock aide, and leg lifter.adapted [name] with ramp.    Prior Function Level of Independence: Needs assistance   Gait / Transfers Assistance Needed: used power w/c for most mobility  ADL's / Homemaking Assistance Needed: Pt primarily used a female urinal, sponge bathed, wore dresses primarily and slip on shoes.  Sister washed her hair in the sink.  Comments: has had multiple falls from power chair     Hand Dominance        Extremity/Trunk Assessment   Upper Extremity Assessment: Overall WFL for tasks assessed           Lower Extremity Assessment: LLE deficits/detail   LLE Deficits / Details: in KI  Cervical / Trunk Assessment: Normal  Communication   Communication: No difficulties  Cognition Arousal/Alertness: Awake/alert Behavior During Therapy: WFL for tasks assessed/performed                        General Comments  Exercises        Assessment/Plan    PT Assessment Patient needs continued PT services  PT Diagnosis Acute pain;Difficulty walking   PT Problem List Decreased knowledge of precautions;Decreased safety awareness;Decreased knowledge of use of DME;Decreased skin integrity;Pain  PT Treatment Interventions DME instruction;Functional mobility training;Therapeutic activities;Patient/family education   PT Goals (Current goals can be found in the Care Plan section) Acute Rehab PT Goals Patient Stated Goal: to go home PT Goal Formulation: With  patient Time For Goal Achievement: 11/25/14 Potential to Achieve Goals: Good    Frequency 7X/week   Barriers to discharge        Co-evaluation               End of Session Equipment Utilized During Treatment: Gait belt Activity Tolerance: Patient tolerated treatment well Patient left: in chair;with call bell/phone within reach Nurse Communication: Mobility status         Time: 1441-1520 PT Time Calculation (min) (ACUTE ONLY): 39 min   Charges:   PT Evaluation $Initial PT Evaluation Tier I: 1 Procedure PT Treatments $Therapeutic Activity: 38-52 mins   PT G Codes:        Rada Hay 11/18/2014, 3:33 PM Blanchard Kelch PT [phone]

## 2014-11-18 NOTE — Progress Notes (Signed)
During bedside reporting pt requested writer give her a straw from bedside table. Writer noticed 2 small white pills in medicine cup in top bedside drawer. When asked about these medicines Pt stated she did not want to take these medications at the time the previous RN brought them to her so she kept them to take "just prior to Therapy".  Writer reviewed pain medication policy that all meds be taken at the time they are brought to the room and NO meds be kept at the bedside, whether they belong to hospital or pt. Pt verbalized understanding and talked on and on about need for additional pain meds when therapy begins. Encouraged pt to be honest about pain so that medical/nursing staff could administer meds as needed.

## 2014-11-18 NOTE — Progress Notes (Signed)
Notified by nurse tech, pt was overheard talking on the phone stating she " had her medication she can take but she can't let staff see it." Pt  requested medication for anxiety. Upon assessment of pt., questioned  about having home medications in room or taking own medications while in the hospital.  Pt denies having any medications in the room at this time.  Reinforced the dangers of  pt overtaking pain medications along with medications given by the nurse. Pt verbalized understanding of RN concern and stated she was not trying to kill herself with Medications.  states she does not have any medications with her at this time. Pt had caller on the phone speak with writer to clarify that she was talking about medications that are currently at home and not meds that are currently in the hospital. Will pass this concern and this interaction along to next shift.

## 2014-11-18 NOTE — Progress Notes (Signed)
Subjective: 1 Day Post-Op Procedure(s) (LRB): IRRIGATION AND DEBRIDEMENT AND REIMPLANTATION OF CEMENT SPACER HIP (Left) Patient reports pain as mild and moderate.   Patient seen in rounds with Dr. Lequita HaltAluisio.  Briefly discussed surgical findings. Patient is well, but has had some minor complaints of pain in the hip, requiring pain medications We will start therapy today.  Plan is to go Home after hospital stay.  Objective: Vital signs in last 24 hours: Temp:  [96.2 F (35.7 C)-99.5 F (37.5 C)] 99.5 F (37.5 C) (01/24 0658) Pulse Rate:  [68-104] 85 (01/24 0658) Resp:  [12-22] 16 (01/24 0658) BP: (100-171)/(45-79) 131/55 mmHg (01/24 0658) SpO2:  [91 %-100 %] 99 % (01/24 0658)  Intake/Output from previous day:  Intake/Output Summary (Last 24 hours) at 11/18/14 0727 Last data filed at 11/18/14 0659  Gross per 24 hour  Intake   5280 ml  Output   3994 ml  Net   1286 ml    Labs:  Recent Labs  11/15/14 1913 11/16/14 0520 11/18/14 0510  HGB 9.9* 8.5* 9.4*    Recent Labs  11/16/14 0520 11/18/14 0510  WBC 2.1* 3.9*  RBC 2.88* 3.17*  HCT 26.9* 29.2*  PLT 139* 163    Recent Labs  11/16/14 0520 11/18/14 0510  NA 136 133*  K 4.3 4.4  CL 102 103  CO2 28 25  BUN 10 13  CREATININE 0.56 0.62  GLUCOSE 111* 113*  CALCIUM 8.5 8.1*    Recent Labs  11/15/14 1913  INR 0.95    EXAM General - Patient is Alert and Appropriate Extremity - Neurovascular intact Sensation intact distally Dressing - dressing C/D/I Motor Function - intact, moving foot and toes well on exam.  Hemovac drains left in place  Past Medical History  Diagnosis Date  . Tenosynovitis of fingers     RIGHT RING AND [facility]AD [facility]  . Bronchitis     dx  07-04-2014 (approx) will finished antibiotic 07-14-2014  . History of concussion     x4  --  no residual  . Migraines   . Depression   . Inability to ambulate due to hip     hx  left total hip  infection and removal arthroplasty /  uses w/c or crutches  . GERD (gastroesophageal reflux disease)   . H/O hiatal hernia   . Chronic constipation   . Chronic narcotic use   . Nocturia   . SUI (stress urinary incontinence, female)   . OA (osteoarthritis)     hips, knees, hips  . Infection of left prosthetic hip joint     left total arthroplasty done 2012/  jan 2015 removal of prosthesis  . Full dentures   . History of cellulitis     lower extremities  . Thinning of skin     easily tears  . Venous stasis dermatitis     BILATERAL LEGS  . Productive cough     RESOLVED - QUIT SMOKING  . Memory problem   . OSA on CPAP     study x3   last one few yrs ago  cpap recommended -- DOES NOT USE OR HAVE CPAP    Assessment/Plan: 1 Day Post-Op Procedure(s) (LRB): IRRIGATION AND DEBRIDEMENT AND REIMPLANTATION OF CEMENT SPACER HIP (Left) Active Problems:   Left hip prosthetic joint infection  Estimated body mass index is 34.29 kg/(m^2) as calculated from the following:   Height as of this encounter: 5\' 10"  (1.778 m).  Weight as of this encounter: 108.41 kg (239 lb). Up with therapy to the chair only.  Bed to chair only, no ambulation.  DVT Prophylaxis - Aspirin Weigh Bearing as tolerated D/C Knee Immobilizer Hemovacs left in place for now, probably pull tomorrow Begin Therapy - Bed to chair transfers only. Hip Preacutions  Avel Peace, PA-C Orthopaedic Surgery 11/18/2014, 7:27 AM

## 2014-11-18 NOTE — Progress Notes (Signed)
11/18/14  Nursing 1320   Dr Ninetta LightsHatcher called regarding patient inquiry regarding po Doxycycline and Clindamycin starting post surgery. Dr hatcher stated Dr. Orvan Falconerampbell would be in Monday 11/19/14 to discuss this with patient. Patient informed

## 2014-11-18 NOTE — Progress Notes (Signed)
11/18/14 Nursing Avel Peacerew Perkins PA called reg patient inquiry regarding restarting po Doxycycline and Clindamycin. Dr. Orvan Falconerampbell to follow up on Monday regarding po antibiotic meds. Patient informed.

## 2014-11-19 ENCOUNTER — Encounter (HOSPITAL_COMMUNITY): Payer: Self-pay | Admitting: Orthopedic Surgery

## 2014-11-19 DIAGNOSIS — B957 Other staphylococcus as the cause of diseases classified elsewhere: Secondary | ICD-10-CM

## 2014-11-19 DIAGNOSIS — S7002XA Contusion of left hip, initial encounter: Secondary | ICD-10-CM

## 2014-11-19 DIAGNOSIS — R296 Repeated falls: Secondary | ICD-10-CM

## 2014-11-19 LAB — BASIC METABOLIC PANEL
Anion gap: 6 (ref 5–15)
BUN: 15 mg/dL (ref 6–23)
CALCIUM: 8.5 mg/dL (ref 8.4–10.5)
CHLORIDE: 102 mmol/L (ref 96–112)
CO2: 28 mmol/L (ref 19–32)
Creatinine, Ser: 0.56 mg/dL (ref 0.50–1.10)
GFR calc Af Amer: >90 mL/min (ref >90)
GFR calc non Af Amer: >90 mL/min (ref >90)
Glucose, Bld: 108 mg/dL — ABNORMAL HIGH (ref 70–99)
Potassium: 4.5 mmol/L (ref 3.5–5.1)
SODIUM: 136 mmol/L (ref 135–145)

## 2014-11-19 LAB — CBC
HCT: 29.1 % — ABNORMAL LOW (ref 36.0–46.0)
Hemoglobin: 9.3 g/dL — ABNORMAL LOW (ref 12.0–15.0)
MCH: 29.4 pg (ref 26.0–34.0)
MCHC: 32 g/dL (ref 30.0–36.0)
MCV: 92.1 fL (ref 78.0–100.0)
Platelets: 155 10*3/uL (ref 150–400)
RBC: 3.16 MIL/uL — AB (ref 3.87–5.11)
RDW: 15 % (ref 11.5–15.5)
WBC: 2.7 10*3/uL — ABNORMAL LOW (ref 4.0–10.5)

## 2014-11-19 MED ORDER — ONDANSETRON HCL 4 MG PO TABS: 4.0000 mg | ORAL_TABLET | Freq: Four times a day (QID) | ORAL | Status: AC | PRN

## 2014-11-19 MED ORDER — OXYCODONE HCL 10 MG PO TABS: 10.0000 mg | ORAL_TABLET | ORAL | Status: DC | PRN

## 2014-11-19 MED ORDER — OXYMORPHONE HCL ER 20 MG PO TB12: 20.0000 mg | ORAL_TABLET | Freq: Three times a day (TID) | ORAL | Status: DC

## 2014-11-19 MED ORDER — METOCLOPRAMIDE HCL 5 MG PO TABS: 5.0000 mg | ORAL_TABLET | Freq: Three times a day (TID) | ORAL | Status: AC | PRN

## 2014-11-19 NOTE — Progress Notes (Signed)
Patient ID: [name], female   DOB: 1956/02/20, 59 y.o.   MRN: [id number]         [facility]    Date of Admission:  11/15/2014   Total days of antibiotics 83        Day 25 doxycycline        Day 25 rifampin          Active Problems:   Left hip prosthetic joint infection   . aspirin EC  325 mg Oral Q breakfast  . darifenacin  15 mg Oral Daily  . dexamethasone  10 mg Intravenous Once  . docusate sodium  100 mg Oral BID  . DULoxetine  60 mg Oral Daily  . feeding supplement (PRO-STAT SUGAR FREE 64)  30 mL Oral TID WC  . ferrous sulfate  325 mg Oral QAC breakfast  . gabapentin  1,200 mg Oral BID  . Naloxegol Oxalate  25 mg Oral QHS  . oxymorphone  20 mg Oral TID  . pantoprazole  40 mg Oral Daily  . rifampin  300 mg Oral BID  . traZODone  150-300 mg Oral QHS  . triamcinolone cream  1 application Topical BID    Subjective: She underwent repeat incision and drainage of her left prosthetic hip 2 days ago. Hematoma was encountered both subcutaneously and deep. Operative Gram stain were negative but operative cultures are growing coagulase-negative staph. She is scheduled for discharged home today.  Review of Systems: Constitutional: positive for malaise, negative for anorexia, chills, fevers, sweats and weight loss Eyes: negative Ears, nose, mouth, throat, and face: negative Respiratory: negative Cardiovascular: negative Gastrointestinal: positive for constipation, negative for abdominal pain and diarrhea Genitourinary:negative  Past Medical History  Diagnosis Date  . Tenosynovitis of fingers     RIGHT RING AND SMALL-HAD SURGERY 07/12/14 [facility]  . Bronchitis     dx  07-04-2014 (approx) will finished antibiotic 07-14-2014  . History of concussion     x4  --  no residual  . Migraines   . Depression   . Inability to ambulate due to hip     hx  left total hip infection and removal arthroplasty /  uses w/c or crutches    . GERD (gastroesophageal reflux disease)   . H/O hiatal hernia   . Chronic constipation   . Chronic narcotic use   . Nocturia   . SUI (stress urinary incontinence, female)   . OA (osteoarthritis)     hips, knees, hips  . Infection of left prosthetic hip joint     left total arthroplasty done 2012/  jan 2015 removal of prosthesis  . Full dentures   . History of cellulitis     lower extremities  . Thinning of skin     easily tears  . Venous stasis dermatitis     BILATERAL LEGS  . Productive cough     RESOLVED - QUIT SMOKING  . Memory problem   . OSA on CPAP     study x3   last one few yrs ago  cpap recommended -- DOES NOT USE OR HAVE CPAP    History  Substance Use Topics  . Smoking status: Former Smoker -- 1.00 packs/day for 30 years    Types: Cigarettes    Quit date: 07/09/2014  . Smokeless tobacco: Never Used     Comment: PT STATES ATTEMPTING TO QUIT SMOKING 07-09-2014--  USING NICOTIN LOZENGERS  . Alcohol Use: No  Family History  Problem Relation Age of Onset  . COPD Mother   . Hypertension Father   . Pancreatic cancer Father   . COPD Brother    Allergies  Allergen Reactions  . Vancomycin Other (See Comments)    LEUKOPENIA    OBJECTIVE: Blood pressure 113/50, pulse 84, temperature 98.3 F (36.8 C), temperature source Oral, resp. rate 16, height  (1.778 m), weight 239 lb (108.41 kg), SpO2 98 %. General: She is in no distress In a chair  Skin: No rash  Lungs: Clear  Cor: Regular S1 and S2 with no murmur  Abdomen: Obese, soft and nontender   Lab Results Lab Results  Component Value Date   WBC 2.7* 11/19/2014   HGB 9.3* 11/19/2014   HCT 29.1* 11/19/2014   MCV 92.1 11/19/2014   PLT 155 11/19/2014    Lab Results  Component Value Date   CREATININE 0.56 11/19/2014   BUN 15 11/19/2014   NA 136 11/19/2014   K 4.5 11/19/2014   CL 102 11/19/2014   CO2 28 11/19/2014    Lab Results  Component Value Date   ALT 10 11/16/2014   AST 22 11/16/2014    ALKPHOS 88 11/16/2014   BILITOT 0.3 11/16/2014     Microbiology: Recent Results (from the past 240 hour(s))  Surgical pcr screen     Status: None   Collection Time: 11/17/14  2:36 AM  Result Value Ref Range Status   MRSA, PCR NEGATIVE NEGATIVE Final   Staphylococcus aureus NEGATIVE NEGATIVE Final    Comment:        The Xpert SA Assay (FDA approved for NASAL specimens in patients over 66 years of age), is one component of a comprehensive surveillance program.  Test performance has been validated by [facility] for patients greater than or equal to 23 year old. It is not intended to diagnose infection nor to guide or monitor treatment.   Anaerobic culture     Status: None (Preliminary result)   Collection Time: 11/17/14  8:31 AM  Result Value Ref Range Status   Specimen Description ABSCESS LEFT HIP  Final   Special Requests NONE  Final   Gram Stain   Final    FEW WBC PRESENT, PREDOMINANTLY PMN NO SQUAMOUS EPITHELIAL CELLS SEEN NO ORGANISMS SEEN Performed at [facility]    Culture PENDING  Incomplete   Report Status PENDING  Incomplete  Culture, routine-abscess     Status: None (Preliminary result)   Collection Time: 11/17/14  8:31 AM  Result Value Ref Range Status   Specimen Description ABSCESS LEFT HIP  Final   Special Requests NONE  Final   Gram Stain   Final    FEW WBC PRESENT, PREDOMINANTLY PMN NO SQUAMOUS EPITHELIAL CELLS SEEN NO ORGANISMS SEEN Performed at [facility]    Culture   Final    FEW STAPHYLOCOCCUS SPECIES (COAGULASE NEGATIVE) Performed at [facility]    Report Status PENDING  Incomplete    Assessment: It appears that she has a coagulase-negative staph infection complicating recent falls and soft tissue hematoma around her left hip prosthetic spacer. She can be discharged on doxycycline and rifampin pending the antibiotic susceptibilities. I will call her if any changes need to be made in her antibiotic  regimen.  Plan: 1. Continue doxycycline and rifampin for now  2. I will arrange follow-up in my clinic   Cliffton Asters, MD [facility] [phone] pager  [phone] cell 11/19/2014, 12:35 PM

## 2014-11-19 NOTE — Progress Notes (Signed)
CARE MANAGEMENT NOTE 11/19/2014  [facility]   Account Number:  [id number]  Date Initiated:  11/19/2014  Documentation initiated by:  DAVIS,RHONDA  Subjective/Objective Assessment:   pt is now post op and being dcd to home is aware of limitations and restriction aqs toward no weight bearing.     Action/Plan:   [facility] who is aware of this admission is following the patient   Anticipated DC Date:  11/19/2014   Anticipated DC Plan:  HOME W HOME HEALTH SERVICES  In-house referral  NA      DC Planning Services  CM consult      [facility]  Resumption Of Svcs/PTA Provider  HOME HEALTH  DURABLE MEDICAL EQUIPMENT   Choice offered to / List presented to:  [name]   DME arranged  BEDSIDE COMMODE      DME agency  [facility]     HH arranged  HH-2 PT      [facility]   Status of service:  Completed, signed off Medicare Important Message given?  YES (If response is "NO", the following Medicare IM given date fields will be blank) Date Medicare IM given:  11/19/2014 Medicare IM given by:   Date Additional Medicare IM given:   Additional Medicare IM given by:    Discharge Disposition:  HOME W HOME HEALTH SERVICES  Per UR Regulation:  Reviewed for med. necessity/level of care/duration of stay  If discussed at Long Length of Stay Meetings, dates discussed:    Comments:  [id number]/Rhonda Davis,RN,BSN,CCM

## 2014-11-19 NOTE — Discharge Summary (Signed)
Physician Discharge Summary   Patient ID: [name] MRN: [id number] DOB/AGE: 58-11-1955 58 y.o.  Admit date: 11/15/2014 Discharge date: 11/19/2014  Primary Diagnosis:  Infected left hip. Admission Diagnoses:  Past Medical History  Diagnosis Date  . Tenosynovitis of fingers     RIGHT RING AND SMALL-HAD SURGERY 07/12/14 [facility]  . Bronchitis     dx  07-04-2014 (approx) will finished antibiotic 07-14-2014  . History of concussion     x4  --  no residual  . Migraines   . Depression   . 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 GERD (gastroesophageal reflux disease)   . H/O hiatal hernia   . Chronic constipation   . Chronic narcotic use   . Nocturia   . SUI (stress urinary incontinence, female)   . OA (osteoarthritis)     hips, knees, hips  . Infection of left prosthetic hip joint     left total arthroplasty done 2012/  jan 2015 removal of prosthesis  . Full dentures   . History of cellulitis     lower extremities  . Thinning of skin     easily tears  . Venous stasis dermatitis     BILATERAL LEGS  . Productive cough     RESOLVED - QUIT SMOKING  . Memory problem   . OSA on CPAP     study x3   last one few yrs ago  cpap recommended -- DOES NOT USE OR HAVE CPAP   Discharge Diagnoses:   Active Problems:   Left hip prosthetic joint infection  Estimated body mass index is 34.29 kg/(m^2) as calculated from the following:   Height as of this encounter: _0  (1.778 m).   Weight as of this encounter: 108.41 kg (239 lb).  Procedure(s) (LRB): IRRIGATION AND DEBRIDEMENT AND REIMPLANTATION OF CEMENT SPACER HIP (Left)   Consults: ID - Dr. Megan Salon  HPI: [name] is a 59 year old female, long complex history in regards to her left hip. She has had a previous hip infection with resection arthroplasty and had 2 other irrigation, debridements, and spacers placed. She  recently had a fall and dislodged the acetabular component of the spacer. She presents now for revision spacer, irrigation and debridement of the hip.  Laboratory Data: Admission on 11/15/2014, Discharged on 11/19/2014  Component Date Value Ref Range Status  . aPTT 11/15/2014 36  24 - 37 seconds Final  . WBC 11/16/2014 2.1* 4.0 - 10.5 K/uL Final  . RBC 11/16/2014 2.88* 3.87 - 5.11 MIL/uL Final  . Hemoglobin 11/16/2014 8.5* 12.0 - 15.0 g/dL Final  . HCT 11/16/2014 26.9* 36.0 - 46.0 % Final  . MCV 11/16/2014 93.4  78.0 - 100.0 fL Final  . MCH 11/16/2014 29.5  26.0 - 34.0 pg Final  . MCHC 11/16/2014 31.6  30.0 - 36.0 g/dL Final  . RDW 11/16/2014 14.7  11.5 - 15.5 % Final  . Platelets 11/16/2014 139* 150 - 400 K/uL Final  . WBC 11/15/2014 2.3* 4.0 - 10.5 K/uL Final  . RBC 11/15/2014 3.29* 3.87 - 5.11 MIL/uL Final  . Hemoglobin 11/15/2014 9.9* 12.0 - 15.0 g/dL Final  . HCT 11/15/2014 30.2* 36.0 - 46.0 % Final  . MCV 11/15/2014 91.8  78.0 - 100.0 fL Final  . MCH 11/15/2014 30.1  26.0 - 34.0 pg Final  . MCHC 11/15/2014 32.8  30.0 - 36.0 g/dL  Final  . RDW 11/15/2014 14.5  11.5 - 15.5 % Final  . Platelets 11/15/2014 138* 150 - 400 K/uL Final  . Neutrophils Relative % 11/15/2014 67  43 - 77 % Final  . Neutro Abs 11/15/2014 1.6* 1.7 - 7.7 K/uL Final  . Lymphocytes Relative 11/15/2014 17  12 - 46 % Final  . Lymphs Abs 11/15/2014 0.4* 0.7 - 4.0 K/uL Final  . Monocytes Relative 11/15/2014 11  3 - 12 % Final  . Monocytes Absolute 11/15/2014 0.3  0.1 - 1.0 K/uL Final  . Eosinophils Relative 11/15/2014 4  0 - 5 % Final  . Eosinophils Absolute 11/15/2014 0.1  0.0 - 0.7 K/uL Final  . Basophils Relative 11/15/2014 1  0 - 1 % Final  . Basophils Absolute 11/15/2014 0.0  0.0 - 0.1 K/uL Final  . Sodium 11/15/2014 134* 135 - 145 mmol/L Final   Please note change in reference range.  . Potassium 11/15/2014 3.8  3.5 - 5.1 mmol/L Final   Please note change in reference range.  . Chloride 11/15/2014 98   96 - 112 mEq/L Final  . CO2 11/15/2014 31  19 - 32 mmol/L Final  . Glucose, Bld 11/15/2014 107* 70 - 99 mg/dL Final  . BUN 11/15/2014 9  6 - 23 mg/dL Final  . Creatinine, Ser 11/15/2014 0.68  0.50 - 1.10 mg/dL Final  . Calcium 11/15/2014 8.7  8.4 - 10.5 mg/dL Final  . Total Protein 11/15/2014 7.6  6.0 - 8.3 g/dL Final  . Albumin 11/15/2014 2.8* 3.5 - 5.2 g/dL Final  . AST 11/15/2014 24  0 - 37 U/L Final  . ALT 11/15/2014 11  0 - 35 U/L Final  . Alkaline Phosphatase 11/15/2014 110  39 - 117 U/L Final  . Total Bilirubin 11/15/2014 0.2* 0.3 - 1.2 mg/dL Final  . GFR calc non Af Amer 11/15/2014 >90  >90 mL/min Final  . GFR calc Af Amer 11/15/2014 >90  >90 mL/min Final   Comment: (NOTE) The eGFR has been calculated using the CKD EPI equation. This calculation has not been validated in all clinical situations. eGFR's persistently <90 mL/min signify possible Chronic Kidney Disease.   . Anion gap 11/15/2014 5  5 - 15 Final  . Sodium 11/16/2014 136  135 - 145 mmol/L Final   Please note change in reference range.  . Potassium 11/16/2014 4.3  3.5 - 5.1 mmol/L Final   Please note change in reference range.  . Chloride 11/16/2014 102  96 - 112 mEq/L Final  . CO2 11/16/2014 28  19 - 32 mmol/L Final  . Glucose, Bld 11/16/2014 111* 70 - 99 mg/dL Final  . BUN 11/16/2014 10  6 - 23 mg/dL Final  . Creatinine, Ser 11/16/2014 0.56  0.50 - 1.10 mg/dL Final  . Calcium 11/16/2014 8.5  8.4 - 10.5 mg/dL Final  . Total Protein 11/16/2014 6.3  6.0 - 8.3 g/dL Final  . Albumin 11/16/2014 2.4* 3.5 - 5.2 g/dL Final  . AST 11/16/2014 22  0 - 37 U/L Final  . ALT 11/16/2014 10  0 - 35 U/L Final  . Alkaline Phosphatase 11/16/2014 88  39 - 117 U/L Final  . Total Bilirubin 11/16/2014 0.3  0.3 - 1.2 mg/dL Final  . GFR calc non Af Amer 11/16/2014 >90  >90 mL/min Final  . GFR calc Af Amer 11/16/2014 >90  >90 mL/min Final   Comment: (NOTE) The eGFR has been calculated using the CKD EPI equation. This calculation has  not  been validated in all clinical situations. eGFR's persistently <90 mL/min signify possible Chronic Kidney Disease.   . Anion gap 11/16/2014 6  5 - 15 Final  . Prothrombin Time 11/15/2014 12.8  11.6 - 15.2 seconds Final  . INR 11/15/2014 0.95  0.00 - 1.49 Final  . Color, Urine 11/15/2014 YELLOW  YELLOW Final  . APPearance 11/15/2014 CLOUDY* CLEAR Final  . Specific Gravity, Urine 11/15/2014 1.011  1.005 - 1.030 Final  . pH 11/15/2014 7.0  5.0 - 8.0 Final  . Glucose, UA 11/15/2014 NEGATIVE  NEGATIVE mg/dL Final  . Hgb urine dipstick 11/15/2014 NEGATIVE  NEGATIVE Final  . Bilirubin Urine 11/15/2014 NEGATIVE  NEGATIVE Final  . Ketones, ur 11/15/2014 NEGATIVE  NEGATIVE mg/dL Final  . Protein, ur 11/15/2014 NEGATIVE  NEGATIVE mg/dL Final  . Urobilinogen, UA 11/15/2014 0.2  0.0 - 1.0 mg/dL Final  . Nitrite 11/15/2014 NEGATIVE  NEGATIVE Final  . Leukocytes, UA 11/15/2014 NEGATIVE  NEGATIVE Final   MICROSCOPIC NOT DONE ON URINES WITH NEGATIVE PROTEIN, BLOOD, LEUKOCYTES, NITRITE, OR GLUCOSE <1000 mg/dL.  . Order Confirmation 11/16/2014 ORDER PROCESSED BY BLOOD BANK   Final  . ABO/RH(D) 11/16/2014 O POS   Final  . Antibody Screen 11/16/2014 POS   Final  . Sample Expiration 11/16/2014 11/19/2014   Final  . Antibody Identification 81/[id number] ANTI E ANTI FYA (Duffy a)   Final  . DAT, IgG 11/16/2014 NEG   Final  . Unit Number 11/16/2014 [id number]   Final  . Blood Component Type 11/16/2014 RBC LR PHER1   Final  . Unit division 11/16/2014 00   Final  . Status of Unit 11/16/2014 ISSUED,FINAL   Final  . Donor AG Type 11/16/2014 NEGATIVE FOR E ANTIGEN NEGATIVE FOR DUFFY A ANTIGEN   Final  . Transfusion Status 11/16/2014 OK TO TRANSFUSE   Final  . Crossmatch Result 11/16/2014 COMPATIBLE   Final  . Unit Number 11/16/2014 [id number]   Final  . Blood Component Type 11/16/2014 RED CELLS,LR   Final  . Unit division 11/16/2014 00   Final  . Status of Unit 11/16/2014 ISSUED,FINAL   Final  . Donor  AG Type 11/16/2014 NEGATIVE FOR E ANTIGEN NEGATIVE FOR DUFFY A ANTIGEN   Final  . Transfusion Status 11/16/2014 OK TO TRANSFUSE   Final  . Crossmatch Result 11/16/2014 COMPATIBLE   Final  . MRSA, PCR 11/17/2014 NEGATIVE  NEGATIVE Final  . Staphylococcus aureus 11/17/2014 NEGATIVE  NEGATIVE Final   Comment:        The Xpert SA Assay (FDA approved for NASAL specimens in patients over 56 years of age), is one component of a comprehensive surveillance program.  Test performance has been validated by [facility] for patients greater than or equal to 73 year old. It is not intended to diagnose infection nor to guide or monitor treatment.   Marland Kitchen Specimen Description 11/17/2014 ABSCESS LEFT HIP   Final  . Special Requests 11/17/2014 NONE   Final  . Gram Stain 11/17/2014    Final                   Value:FEW WBC PRESENT, PREDOMINANTLY PMN NO SQUAMOUS EPITHELIAL CELLS SEEN NO ORGANISMS SEEN Performed at [facility]   . Culture 11/17/2014    Final                   Value:NO ANAEROBES ISOLATED Performed at [facility]   . Report Status 11/17/2014 11/22/2014 FINAL   Final  . Specimen  Description 11/17/2014 ABSCESS LEFT HIP   Final  . Special Requests 11/17/2014 NONE   Final  . Gram Stain 11/17/2014    Final                   Value:FEW WBC PRESENT, PREDOMINANTLY PMN NO SQUAMOUS EPITHELIAL CELLS SEEN NO ORGANISMS SEEN Performed at [facility]   . Culture 11/17/2014    Final                   Value:FEW STAPHYLOCOCCUS SPECIES (COAGULASE NEGATIVE) Performed at [facility]   . Report Status 11/17/2014 11/20/2014 FINAL   Final  . WBC 11/18/2014 3.9* 4.0 - 10.5 K/uL Final  . RBC 11/18/2014 3.17* 3.87 - 5.11 MIL/uL Final  . Hemoglobin 11/18/2014 9.4* 12.0 - 15.0 g/dL Final  . HCT 11/18/2014 29.2* 36.0 - 46.0 % Final  . MCV 11/18/2014 92.1  78.0 - 100.0 fL Final  . MCH 11/18/2014 29.7  26.0 - 34.0 pg Final  . MCHC 11/18/2014 32.2  30.0 - 36.0 g/dL Final  .  RDW 11/18/2014 15.5  11.5 - 15.5 % Final  . Platelets 11/18/2014 163  150 - 400 K/uL Final  . Sodium 11/18/2014 133* 135 - 145 mmol/L Final  . Potassium 11/18/2014 4.4  3.5 - 5.1 mmol/L Final  . Chloride 11/18/2014 103  96 - 112 mmol/L Final  . CO2 11/18/2014 25  19 - 32 mmol/L Final  . Glucose, Bld 11/18/2014 113* 70 - 99 mg/dL Final  . BUN 11/18/2014 13  6 - 23 mg/dL Final  . Creatinine, Ser 11/18/2014 0.62  0.50 - 1.10 mg/dL Final  . Calcium 11/18/2014 8.1* 8.4 - 10.5 mg/dL Final  . GFR calc non Af Amer 11/18/2014 >90  >90 mL/min Final  . GFR calc Af Amer 11/18/2014 >90  >90 mL/min Final   Comment: (NOTE) The eGFR has been calculated using the CKD EPI equation. This calculation has not been validated in all clinical situations. eGFR's persistently <90 mL/min signify possible Chronic Kidney Disease.   . Anion gap 11/18/2014 5  5 - 15 Final  . WBC 11/19/2014 2.7* 4.0 - 10.5 K/uL Final  . RBC 11/19/2014 3.16* 3.87 - 5.11 MIL/uL Final  . Hemoglobin 11/19/2014 9.3* 12.0 - 15.0 g/dL Final  . HCT 11/19/2014 29.1* 36.0 - 46.0 % Final  . MCV 11/19/2014 92.1  78.0 - 100.0 fL Final  . MCH 11/19/2014 29.4  26.0 - 34.0 pg Final  . MCHC 11/19/2014 32.0  30.0 - 36.0 g/dL Final  . RDW 11/19/2014 15.0  11.5 - 15.5 % Final  . Platelets 11/19/2014 155  150 - 400 K/uL Final  . Sodium 11/19/2014 136  135 - 145 mmol/L Final  . Potassium 11/19/2014 4.5  3.5 - 5.1 mmol/L Final  . Chloride 11/19/2014 102  96 - 112 mmol/L Final  . CO2 11/19/2014 28  19 - 32 mmol/L Final  . Glucose, Bld 11/19/2014 108* 70 - 99 mg/dL Final  . BUN 11/19/2014 15  6 - 23 mg/dL Final  . Creatinine, Ser 11/19/2014 0.56  0.50 - 1.10 mg/dL Final  . Calcium 11/19/2014 8.5  8.4 - 10.5 mg/dL Final  . GFR calc non Af Amer 11/19/2014 >90  >90 mL/min Final  . GFR calc Af Amer 11/19/2014 >90  >90 mL/min Final   Comment: (NOTE) The eGFR has been calculated using the CKD EPI equation. This calculation has not been validated in all  clinical situations. eGFR's persistently <90  mL/min signify possible Chronic Kidney Disease.   Georgiann Hahn gap 11/19/2014 6  5 - 15 Final  Admission on 10/11/2014, Discharged on 10/16/2014  Component Date Value Ref Range Status  . WBC 10/11/2014 2.1* 4.0 - 10.5 K/uL Final  . RBC 10/11/2014 3.59* 3.87 - 5.11 MIL/uL Final  . Hemoglobin 10/11/2014 10.7* 12.0 - 15.0 g/dL Final  . HCT 10/11/2014 32.5* 36.0 - 46.0 % Final  . MCV 10/11/2014 90.5  78.0 - 100.0 fL Final  . MCH 10/11/2014 29.8  26.0 - 34.0 pg Final  . MCHC 10/11/2014 32.9  30.0 - 36.0 g/dL Final  . RDW 10/11/2014 13.3  11.5 - 15.5 % Final  . Platelets 10/11/2014 97* 150 - 400 K/uL Final   Comment: SPECIMEN CHECKED FOR CLOTS REPEATED TO VERIFY PLATELET COUNT CONFIRMED BY SMEAR   . Sodium 10/11/2014 137  137 - 147 mEq/L Final  . Potassium 10/11/2014 4.5  3.7 - 5.3 mEq/L Final  . Chloride 10/11/2014 100  96 - 112 mEq/L Final  . CO2 10/11/2014 26  19 - 32 mEq/L Final  . Glucose, Bld 10/11/2014 100* 70 - 99 mg/dL Final  . BUN 10/11/2014 11  6 - 23 mg/dL Final  . Creatinine, Ser 10/11/2014 0.65  0.50 - 1.10 mg/dL Final  . Calcium 10/11/2014 9.4  8.4 - 10.5 mg/dL Final  . GFR calc non Af Amer 10/11/2014 >90  >90 mL/min Final  . GFR calc Af Amer 10/11/2014 >90  >90 mL/min Final   Comment: (NOTE) The eGFR has been calculated using the CKD EPI equation. This calculation has not been validated in all clinical situations. eGFR's persistently <90 mL/min signify possible Chronic Kidney Disease.   . Anion gap 10/11/2014 11  5 - 15 Final  . Specimen Description 10/12/2014 BLOOD LEFT HAND   Final  . Special Requests 10/12/2014 BOTTLES DRAWN AEROBIC ONLY 4ML   Final  . Culture  Setup Time 10/12/2014    Final                   Value:10/12/2014 08:48 Performed at [facility]   . Culture 10/12/2014    Final                   Value:NO GROWTH 5 DAYS Performed at [facility]   . Report Status 10/12/2014 10/18/2014 FINAL    Final  . Specimen Description 10/12/2014 BLOOD LEFT ANTECUBITAL   Final  . Special Requests 10/12/2014 BOTTLES DRAWN AEROBIC ONLY 5ML   Final  . Culture  Setup Time 10/12/2014    Final                   Value:10/12/2014 08:49 Performed at [facility]   . Culture 10/12/2014    Final                   Value:NO GROWTH 5 DAYS Performed at [facility]   . Report Status 10/12/2014 10/18/2014 FINAL   Final  . Prothrombin Time 10/12/2014 13.6  11.6 - 15.2 seconds Final  . INR 10/12/2014 1.03  0.00 - 1.49 Final  . WBC 10/12/2014 2.3* 4.0 - 10.5 K/uL Final  . RBC 10/12/2014 3.58* 3.87 - 5.11 MIL/uL Final  . Hemoglobin 10/12/2014 10.7* 12.0 - 15.0 g/dL Final  . HCT 10/12/2014 32.1* 36.0 - 46.0 % Final  . MCV 10/12/2014 89.7  78.0 - 100.0 fL Final  . MCH 10/12/2014 29.9  26.0 - 34.0 pg Final  .  MCHC 10/12/2014 33.3  30.0 - 36.0 g/dL Final  . RDW 10/12/2014 13.2  11.5 - 15.5 % Final  . Platelets 10/12/2014 90* 150 - 400 K/uL Final   CONSISTENT WITH PREVIOUS RESULT  . Sodium 10/12/2014 137  137 - 147 mEq/L Final  . Potassium 10/12/2014 4.3  3.7 - 5.3 mEq/L Final  . Chloride 10/12/2014 99  96 - 112 mEq/L Final  . CO2 10/12/2014 26  19 - 32 mEq/L Final  . Glucose, Bld 10/12/2014 99  70 - 99 mg/dL Final  . BUN 10/12/2014 10  6 - 23 mg/dL Final  . Creatinine, Ser 10/12/2014 0.65  0.50 - 1.10 mg/dL Final  . Calcium 10/12/2014 9.5  8.4 - 10.5 mg/dL Final  . Total Protein 10/12/2014 7.3  6.0 - 8.3 g/dL Final  . Albumin 10/12/2014 3.4* 3.5 - 5.2 g/dL Final  . AST 10/12/2014 25  0 - 37 U/L Final  . ALT 10/12/2014 11  0 - 35 U/L Final  . Alkaline Phosphatase 10/12/2014 104  39 - 117 U/L Final  . Total Bilirubin 10/12/2014 0.3  0.3 - 1.2 mg/dL Final  . GFR calc non Af Amer 10/12/2014 >90  >90 mL/min Final  . GFR calc Af Amer 10/12/2014 >90  >90 mL/min Final   Comment: (NOTE) The eGFR has been calculated using the CKD EPI equation. This calculation has not been validated in all  clinical situations. eGFR's persistently <90 mL/min signify possible Chronic Kidney Disease.   . Anion gap 10/12/2014 12  5 - 15 Final  . Glucose-Capillary 10/13/2014 136* 70 - 99 mg/dL Final  . WBC 10/14/2014 2.1* 4.0 - 10.5 K/uL Final  . RBC 10/14/2014 3.51* 3.87 - 5.11 MIL/uL Final  . Hemoglobin 10/14/2014 10.5* 12.0 - 15.0 g/dL Final  . HCT 10/14/2014 32.1* 36.0 - 46.0 % Final  . MCV 10/14/2014 91.5  78.0 - 100.0 fL Final  . MCH 10/14/2014 29.9  26.0 - 34.0 pg Final  . MCHC 10/14/2014 32.7  30.0 - 36.0 g/dL Final  . RDW 10/14/2014 13.3  11.5 - 15.5 % Final  . Platelets 10/14/2014 88* 150 - 400 K/uL Final   CONSISTENT WITH PREVIOUS RESULT  . Sodium 10/14/2014 136* 137 - 147 mEq/L Final  . Potassium 10/14/2014 4.4  3.7 - 5.3 mEq/L Final  . Chloride 10/14/2014 99  96 - 112 mEq/L Final  . CO2 10/14/2014 27  19 - 32 mEq/L Final  . Glucose, Bld 10/14/2014 109* 70 - 99 mg/dL Final  . BUN 10/14/2014 14  6 - 23 mg/dL Final  . Creatinine, Ser 10/14/2014 0.65  0.50 - 1.10 mg/dL Final  . Calcium 10/14/2014 9.3  8.4 - 10.5 mg/dL Final  . GFR calc non Af Amer 10/14/2014 >90  >90 mL/min Final  . GFR calc Af Amer 10/14/2014 >90  >90 mL/min Final   Comment: (NOTE) The eGFR has been calculated using the CKD EPI equation. This calculation has not been validated in all clinical situations. eGFR's persistently <90 mL/min signify possible Chronic Kidney Disease.   . Anion gap 10/14/2014 10  5 - 15 Final  . Vit D, 25-Hydroxy 10/14/2014 23* 30 - 100 ng/mL Final   Comment: (NOTE) ** Please note change in reference range(s). ** Vitamin D Status           25-OH Vitamin D       Deficiency                <20 ng/mL  Insufficiency         20 - 29 ng/mL       Optimal             > or = 30 ng/mL For 25-OH Vitamin D testing on patients on D2-supplementation and patients for whom quantitation of D2 and D3 fractions is required, the QuestAssureD 25-OH VIT D, (D2,D3), LC/MS/MS is recommended: order  code [id number] (patients > 2 yrs). Performed at [facility]   . MRSA, PCR 10/13/2014 INVALID RESULTS, SPECIMEN SENT FOR CULTURE* NEGATIVE Final   CRUTCHFIELD,J RN [phone] [id number] COVINGTON,N  . Staphylococcus aureus 10/13/2014 INVALID RESULTS, SPECIMEN SENT FOR CULTURE* NEGATIVE Final   Comment: SPOKE WITH CRUTHFIEND,J RN [phone] COVINGTON,N        The Xpert SA Assay (FDA approved for NASAL specimens in patients over 73 years of age), is one component of a comprehensive surveillance program.  Test performance has been validated by [employer] for patients greater than or equal to 60 year old. It is not intended to diagnose infection nor to guide or monitor treatment.   Marland Kitchen Specimen Description 10/14/2014 NASOPHARYNGEAL   Final  . Special Requests 10/14/2014 NONE   Final  . Culture 10/14/2014    Final                   Value:NO GROWTH 2 DAYS Note: NOMRSA Performed at [facility]   . Report Status 10/14/2014 10/16/2014 FINAL   Final  . ABO/RH(D) 10/14/2014 O POS   Final  . Antibody Screen 10/14/2014 POS   Final  . Sample Expiration 10/14/2014 10/17/2014   Final  . Antibody Identification 56/81/2751 ANTI E ANTI FYA (Duffy a)   Final  . PT AG Type 10/14/2014 NEGATIVE FOR DUFFY A ANTIGEN   Final  . DAT, IgG 10/14/2014 NEG   Final  . Unit Number 10/14/2014 [id number]   Final  . Blood Component Type 10/14/2014 RED CELLS,LR   Final  . Unit division 10/14/2014 00   Final  . Status of Unit 10/14/2014 REL FROM [facility]   Final  . Donor AG Type 10/14/2014 NEGATIVE FOR E ANTIGEN   Final  . Transfusion Status 10/14/2014 DO NOT ISSUE FOR TRANSFUSION   Final  . Crossmatch Result 10/14/2014 INCOMPATIBLE   Final  . Unit Number 10/14/2014 [id number]   Final  . Blood Component Type 10/14/2014 RED CELLS,LR   Final  . Unit division 10/14/2014 00   Final  . Status of Unit 10/14/2014 ISSUED,FINAL   Final  . Donor AG Type 10/14/2014 NEGATIVE FOR E ANTIGEN NEGATIVE FOR DUFFY A  ANTIGEN   Final  . Transfusion Status 10/14/2014 OK TO TRANSFUSE   Final  . Crossmatch Result 10/14/2014 COMPATIBLE   Final  . Unit Number 10/14/2014 [id number]   Final  . Blood Component Type 10/14/2014 RED CELLS,LR   Final  . Unit division 10/14/2014 00   Final  . Status of Unit 10/14/2014 ISSUED,FINAL   Final  . Donor AG Type 10/14/2014 NEGATIVE FOR E ANTIGEN NEGATIVE FOR DUFFY A ANTIGEN   Final  . Transfusion Status 10/14/2014 OK TO TRANSFUSE   Final  . Crossmatch Result 10/14/2014 COMPATIBLE   Final  . Order Confirmation 10/14/2014 ORDER PROCESSED BY BLOOD BANK   Final  . Specimen Description 10/14/2014 WOUND LEFT HIP FLUID   Final  . Special Requests 10/14/2014 PATIENT ON FOLLOWING ANCEF, DAPTOMYCIN, RIFAMPIN   Final  . Gram Stain 10/14/2014    Final  Value:RARE WBC PRESENT, PREDOMINANTLY PMN NO SQUAMOUS EPITHELIAL CELLS SEEN NO ORGANISMS SEEN Performed at [facility]   . Culture 10/14/2014    Final                   Value:NO ANAEROBES ISOLATED Performed at [facility]   . Report Status 10/14/2014 10/19/2014 FINAL   Final  . Specimen Description 10/14/2014 WOUND LEFT HIP FLUID   Final  . Special Requests 10/14/2014 PATIENT ON FOLLOWING ANCEF,DAPTOMYCIN, RIFAMPIN   Final  . Gram Stain 10/14/2014    Final                   Value:FEW WBC PRESENT, PREDOMINANTLY PMN NO ORGANISMS SEEN Performed at [facility]   . Culture 10/14/2014    Final                   Value:NO GROWTH 3 DAYS Performed at [facility]   . Report Status 10/14/2014 10/17/2014 FINAL   Final  . Total CK 10/15/2014 112  7 - 177 U/L Final  . Order Confirmation 10/14/2014 ORDER PROCESSED BY BLOOD BANK   Final  . Sodium 10/15/2014 137  137 - 147 mEq/L Final  . Potassium 10/15/2014 4.6  3.7 - 5.3 mEq/L Final  . Chloride 10/15/2014 102  96 - 112 mEq/L Final  . CO2 10/15/2014 26  19 - 32 mEq/L Final  . Glucose, Bld 10/15/2014 118* 70 - 99 mg/dL Final  . BUN  10/15/2014 13  6 - 23 mg/dL Final  . Creatinine, Ser 10/15/2014 0.60  0.50 - 1.10 mg/dL Final  . Calcium 10/15/2014 8.6  8.4 - 10.5 mg/dL Final  . Total Protein 10/15/2014 5.7* 6.0 - 8.3 g/dL Final  . Albumin 10/15/2014 2.6* 3.5 - 5.2 g/dL Final  . AST 10/15/2014 24  0 - 37 U/L Final  . ALT 10/15/2014 11  0 - 35 U/L Final  . Alkaline Phosphatase 10/15/2014 74  39 - 117 U/L Final  . Total Bilirubin 10/15/2014 0.7  0.3 - 1.2 mg/dL Final  . GFR calc non Af Amer 10/15/2014 >90  >90 mL/min Final  . GFR calc Af Amer 10/15/2014 >90  >90 mL/min Final   Comment: (NOTE) The eGFR has been calculated using the CKD EPI equation. This calculation has not been validated in all clinical situations. eGFR's persistently <90 mL/min signify possible Chronic Kidney Disease.   . Anion gap 10/15/2014 9  5 - 15 Final  . Prealbumin 10/15/2014 10.3* 17.0 - 34.0 mg/dL Final   Performed at [facility]  . WBC 10/15/2014 4.8  4.0 - 10.5 K/uL Final  . RBC 10/15/2014 3.31* 3.87 - 5.11 MIL/uL Final  . Hemoglobin 10/15/2014 9.9* 12.0 - 15.0 g/dL Final  . HCT 10/15/2014 29.9* 36.0 - 46.0 % Final  . MCV 10/15/2014 90.3  78.0 - 100.0 fL Final  . MCH 10/15/2014 29.9  26.0 - 34.0 pg Final  . MCHC 10/15/2014 33.1  30.0 - 36.0 g/dL Final  . RDW 10/15/2014 15.3  11.5 - 15.5 % Final  . Platelets 10/15/2014 119* 150 - 400 K/uL Final   Comment: DELTA CHECK NOTED REPEATED TO VERIFY SPECIMEN CHECKED FOR CLOTS CONSISTENT WITH PREVIOUS RESULT   . Glucose-Capillary 10/15/2014 129* 70 - 99 mg/dL Final  . WBC 10/16/2014 3.0* 4.0 - 10.5 K/uL Final  . RBC 10/16/2014 2.65* 3.87 - 5.11 MIL/uL Final  . Hemoglobin 10/16/2014 7.9* 12.0 - 15.0 g/dL Final  Comment: DELTA CHECK NOTED REPEATED TO VERIFY   . HCT 10/16/2014 23.2* 36.0 - 46.0 % Final  . MCV 10/16/2014 87.5  78.0 - 100.0 fL Final  . MCH 10/16/2014 29.8  26.0 - 34.0 pg Final  . MCHC 10/16/2014 34.1  30.0 - 36.0 g/dL Final  . RDW 10/16/2014 15.1  11.5 - 15.5 %  Final  . Platelets 10/16/2014 83* 150 - 400 K/uL Final   Comment: DELTA CHECK NOTED REPEATED TO VERIFY SPECIMEN CHECKED FOR CLOTS PLATELET COUNT CONFIRMED BY SMEAR   . Sed Rate 10/16/2014 74* 0 - 22 mm/hr Final  . CRP 10/16/2014 5.7* <0.60 mg/dL Final   Performed at [facility]     X-Rays:Dg Shoulder Right  10/29/2014   CLINICAL DATA:  Status post fall out of a wheelchair this morning. Right shoulder pain. Initial encounter.  EXAM: RIGHT SHOULDER - 2+ VIEW  COMPARISON:  None.  FINDINGS: No acute bony or joint abnormality is identified. 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 Imaged right lung and ribs are unremarkable.  IMPRESSION: No acute finding.   Electronically Signed   By: Inge Rise M.D.   On: 10/29/2014 12:52   Dg Tibia/fibula Right  10/29/2014   CLINICAL DATA:  [place] out of wheelchair.  Laceration.  EXAM: RIGHT TIBIA AND FIBULA - 2 VIEW  COMPARISON:  None.  FINDINGS: The patient is status post RIGHT total knee replacement. There is a long stem in the tibial component with evidence for previous tibial fracture and healing. No dislocation is evident. There is marked soft tissue swelling of the lower leg. A bandage overlies the anterior and lateral aspect of the lower leg corresponding to the laceration. No radiopaque foreign body.  IMPRESSION: Negative for fracture.  Soft tissue swelling.   Electronically Signed   By: Rolla Flatten M.D.   On: 10/29/2014 12:44   Ct Head Wo Contrast  10/29/2014   CLINICAL DATA:  [place] from scooter.  Head pain.  EXAM: CT HEAD WITHOUT CONTRAST  TECHNIQUE: Contiguous axial images were obtained from the base of the skull through the vertex without intravenous contrast.  COMPARISON:  CT head 06/13/2010.  FINDINGS: RIGHT frontal scalp soft tissue swelling. No skull fracture. No evidence for acute infarction, hemorrhage, mass lesion, hydrocephalus, or extra-axial fluid. Premature for age cerebral and cerebellar atrophy. No  appreciable white matter disease. Hyperostosis. No sinus air-fluid level. Negative mastoids. Other than the scalp hematoma, there is no change from priors.  IMPRESSION: RIGHT frontal scalp hematoma. No skull fracture or intracranial hemorrhage. Premature for age atrophy.   Electronically Signed   By: Rolla Flatten M.D.   On: 10/29/2014 13:09   Dg Hips Bilat With Pelvis Min 5 Views  10/29/2014   CLINICAL DATA:  Pain secondary to a fall out of a wheelchair of this morning. Large laceration of the right lower leg. Right hip and right shoulder pain.  EXAM: BILATERAL HIPS WITH AP PELVIS  TECHNIQUE: AP pelvis with AP and lateral views of both hips  COMPARISON:  Radiographs dated 10/11/2014 and 08/29/2014  FINDINGS: The right hip appears normal. There is an old fracture of the left acetabulum with secondary deformity. Since the prior radiograph of 10/11/2014 the femoral head component of the left hip prosthesis has been relocated into the distorted left acetabulum. The methylmethacrylate remains dislocated from the acetabulum.  There are no acute fractures of the left femur. Left total knee prosthesis, side plate, screws, cerclage wires, and the femoral stem of the left  hip prosthesis appear unchanged.  IMPRESSION: No acute abnormality. Femoral head of the left hip prosthesis is now relocated into the distorted acetabulum. Methylmethacrylate remains dislocated from the acetabulum.   Electronically Signed   By: Rozetta Nunnery M.D.   On: 10/29/2014 12:51    EKG:No orders found for this or any previous visit.   Hospital Course: Patient was admitted to [facility] on 11/16/2014 from the office after been seen by Dr. Wynelle Link for continued issues surrounding her left hip and continued problems.  She was admitted and placed at bedrest with [facility] privileges.  She was started on IV ABX and preoped for surgery.  On the third hospital day, 11/17/2014, she was taken to the OR and underwent the above state procedure without  complications.  Patient tolerated the procedure well and was later transferred to the recovery room and then to the orthopaedic floor for postoperative care.  They were given PO and IV analgesics for pain control following their surgery.  They were given 24 hours of postoperative antibiotics of  Anti-infectives    Start     Dose/Rate Route Frequency Ordered Stop   11/17/14 1400  ceFAZolin (ANCEF) IVPB 2 g/50 mL premix     2 g100 mL/hr over 30 Minutes Intravenous Every 6 hours 11/17/14 1113 11/17/14 2112   11/15/14 2200  [MAR Hold]  doxycycline (VIBRA-TABS) tablet 100 mg  Status:  Discontinued     (MAR Hold since 11/17/14 0704)   100 mg Oral 2 times daily 11/15/14 1814 11/17/14 1113   11/15/14 2200  rifampin (RIFADIN) capsule 300 mg  Status:  Discontinued     300 mg Oral 2 times daily 11/15/14 1814 11/19/14 1714     and started on DVT prophylaxis in the form of Aspirin.   PT and OT were ordered for total hip protocol.  The patient was allowed to be WBAT with therapy. Discharge planning was consulted to help with postop disposition and equipment needs.  Patient had a fair night on the evening of surgery but did have some pain.  They started to get up OOB with therapy on day one.  She was only allowed bed to chair transfers only.  The knee immobilizer was removed and discontinued.  Continued to work with therapy into day two.  Dressing was changed on day two and the incision was healing well.  By hospital day four, POD 2, the patient was stable following surgery.  Incision was healing well.  Patient was seen in rounds and was ready to go home.   Diet: Regular diet Activity:WBAT No bending hip over 90 degrees- A "L" Angle Do not cross legs Do not let foot roll inward When turning these patients a pillow should be placed between the patient's legs to prevent crossing. Patients should have the affected knee fully extended when trying to sit or stand from all surfaces to prevent excessive hip  flexion. When ambulating and turning toward the affected side the affected leg should have the toes turned out prior to moving the walker and the rest of patient's body as to prevent internal rotation/ turning in of the leg. Abduction pillows are the most effective way to prevent a patient from not crossing legs or turning toes in at rest. If an abduction pillow is not ordered placing a regular pillow length wise between the patient's legs is also an effective reminder. It is imperative that these precautions be maintained so that the surgical hip does not dislocate. Follow-up:in 2 weeks  Disposition - Home Discharged Condition: good DVT - Aspirin       Discharge Instructions    Bed to Chair Transfer    Complete by:  As directed      Call MD / Call 911    Complete by:  As directed   If you experience chest pain or shortness of breath, CALL 911 and be transported to the hospital emergency room.  If you develope a fever above 101 F, pus (white drainage) or increased drainage or redness at the wound, or calf pain, call your surgeon's office.     Change dressing    Complete by:  As directed   You may change your dressing dressing daily with sterile 4 x 4 inch gauze dressing and paper tape.  Do not submerge the incision under water.     Constipation Prevention    Complete by:  As directed   Drink plenty of fluids.  Prune juice may be helpful.  You may use a stool softener, such as Colace (over the counter) 100 mg twice a day.  Use MiraLax (over the counter) for constipation as needed.     Diet general    Complete by:  As directed      Discharge instructions    Complete by:  As directed   Pick up stool softner and laxative for home use following surgery while on pain medications. Do not submerge incision under water. Please use good hand washing techniques while changing dressing each day. May shower starting three days after surgery. Please use a clean towel to pat the incision dry following  showers. Continue to use ice for pain and swelling after surgery. Do not use any lotions or creams on the incision until instructed by your surgeon. Hip precautions.  Total Hip Protocol.  Resume Aspirin 325 mg daily at home  Weight bearing as tolerated to left leg.   Bed to chair transfers only. No ambulation at this time.  Postoperative Constipation Protocol  Constipation - defined medically as fewer than three stools per week and severe constipation as less than one stool per week.  One of the most common issues patients have following surgery is constipation.  Even if you have a regular bowel pattern at home, your normal regimen is likely to be disrupted due to multiple reasons following surgery.  Combination of anesthesia, postoperative narcotics, change in appetite and fluid intake all can affect your bowels.  In order to avoid complications following surgery, here are some recommendations in order to help you during your recovery period.  Colace (docusate) - Pick up an over-the-counter form of Colace or another stool softener and take twice a day as long as you are requiring postoperative pain medications.  Take with a full glass of water daily.  If you experience loose stools or diarrhea, hold the colace until you stool forms back up.  If your symptoms do not get better within 1 week or if they get worse, check with your doctor.  Dulcolax (bisacodyl) - Pick up over-the-counter and take as directed by the product packaging as needed to assist with the movement of your bowels.  Take with a full glass of water.  Use this product as needed if not relieved by Colace only.   MiraLax (polyethylene glycol) - Pick up over-the-counter to have on hand.  MiraLax is a solution that will increase the amount of water in your bowels to assist with bowel movements.  Take as directed and can mix with a  glass of water, juice, soda, coffee, or tea.  Take if you go more than two days without a movement. Do  not use MiraLax more than once per day. Call your doctor if you are still constipated or irregular after using this medication for 7 days in a row.  If you continue to have problems with postoperative constipation, please contact the office for further assistance and recommendations.  If you experience "the worst abdominal pain ever" or develop nausea or vomiting, please contact the office immediatly for further recommendations for treatment.     Do not sit on low chairs, stoools or toilet seats, as it may be difficult to get up from low surfaces    Complete by:  As directed      Driving restrictions    Complete by:  As directed   No driving until released by the physician.     Follow the hip precautions as taught in Physical Therapy    Complete by:  As directed      Lifting restrictions    Complete by:  As directed   No lifting until released by the physician.     Patient may shower    Complete by:  As directed   You may shower without a dressing once there is no drainage.  Do not wash over the wound.  If drainage remains, do not shower until drainage stops.     TED hose    Complete by:  As directed   Use stockings (TED hose) for 3 weeks on both leg(s).  You may remove them at night for sleeping.     Weight bearing as tolerated    Complete by:  As directed   Laterality:  left  Extremity:  Lower            Medication List    STOP taking these medications        clindamycin 150 MG capsule  Commonly known as:  CLEOCIN      TAKE these medications        acetaminophen 325 MG tablet  Commonly known as:  TYLENOL  Take 2 tablets (650 mg total) by mouth every 6 (six) hours as needed for mild pain (or Fever >/= 101).  Notes to Patient:  Blood thinnner     aspirin 325 MG tablet  Take 325 mg by mouth daily.     b complex vitamins tablet  Take 1 tablet by mouth daily.     clotrimazole-betamethasone cream  Commonly known as:  LOTRISONE  Apply 1 application topically 2 (two)  times daily as needed (rash in skin folds).     CYMBALTA 60 MG capsule  Generic drug:  DULoxetine  Take 60 mg by mouth daily.     doxycycline 100 MG tablet  Commonly known as:  VIBRA-TABS  Take 1 tablet (100 mg total) by mouth 2 (two) times daily.     feeding supplement (PRO-STAT SUGAR FREE 64) Liqd  Take 30 mLs by mouth 3 (three) times daily with meals.     ferrous sulfate 325 (65 FE) MG EC tablet  Take 325 mg by mouth daily.     Fish Oil 1000 MG Caps  Take 1 capsule by mouth daily.     furosemide 40 MG tablet  Commonly known as:  LASIX  Take 40 mg by mouth 2 (two) times daily as needed (fluid retention).     gabapentin 400 MG capsule  Commonly known as:  NEURONTIN  Take 2 capsules (  800 mg total) by mouth 2 (two) times daily.     hydrOXYzine 25 MG tablet  Commonly known as:  ATARAX/VISTARIL  Take 25 mg by mouth every 6 (six) hours as needed for anxiety (anxiety).     methocarbamol 500 MG tablet  Commonly known as:  ROBAXIN  Take 1 tablet (500 mg total) by mouth every 6 (six) hours as needed for muscle spasms.  Notes to Patient:  Muscle relaxer     metoCLOPramide 5 MG tablet  Commonly known as:  REGLAN  Take 1-2 tablets (5-10 mg total) by mouth every 8 (eight) hours as needed for nausea (if ondansetron (ZOFRAN) ineffective.).     MOVANTIK PO  Take 25 mg by mouth at bedtime.     multivitamin tablet  Take 1 tablet by mouth at bedtime.     nicotine polacrilex 4 MG lozenge  Commonly known as:  COMMIT  Take 4 mg by mouth as needed for smoking cessation (smoking cessation).     omeprazole 40 MG capsule  Commonly known as:  PRILOSEC  Take 40 mg by mouth daily.     ondansetron 4 MG tablet  Commonly known as:  ZOFRAN  Take 1 tablet (4 mg total) by mouth every 6 (six) hours as needed for nausea.     Oxycodone HCl 10 MG Tabs  Take 1-2 tablets (10-20 mg total) by mouth every 3 (three) hours as needed (pain).     oxymorphone 20 MG 12 hr tablet  Commonly known as:   OPANA ER  Take 1 tablet (20 mg total) by mouth 3 (three) times daily.     promethazine 12.5 MG tablet  Commonly known as:  PHENERGAN  Take 1 tablet (12.5 mg total) by mouth every 6 (six) hours as needed for nausea or vomiting.     rifampin 300 MG capsule  Commonly known as:  RIFADIN  Take 1 capsule (300 mg total) by mouth 2 (two) times daily. DO NOT START UNTIL Tuesday  09/18/2014 WHEN THE PATIENT COMES OFF THE XARELTO.     sodium chloride 0.65 % Soln nasal spray  Commonly known as:  OCEAN  Place 2 sprays into both nostrils daily as needed for congestion.     solifenacin 10 MG tablet  Commonly known as:  VESICARE  Take 10 mg by mouth at bedtime.     SUMAtriptan 100 MG tablet  Commonly known as:  IMITREX  Take 100 mg by mouth once. May repeat in 2 hours if headache persists or recurs. NO MORE THAN 2 IN A 24 HOUR PERIOD     traZODone 150 MG tablet  Commonly known as:  DESYREL  Take 150-300 mg by mouth at bedtime.     triamcinolone cream 0.1 %  Commonly known as:  KENALOG  Apply 1 application topically 2 (two) times daily. CALF OF BOTH LEGS - BECAUSE OF SKIN IRRITATION THOUGHT TO BE DUE TO SWELLING OF LEGS     vitamin C 100 MG tablet  Take 100 mg by mouth daily.     vitamin D (CHOLECALCIFEROL) 400 UNITS tablet  Take 2 tablets (800 Units total) by mouth daily.     zinc gluconate 50 MG tablet  Take 50 mg by mouth daily.       Follow-up Information    Follow up with Gearlean Alf, MD. Schedule an appointment as soon as possible for a visit in 2 weeks.   Specialty:  Orthopedic Surgery   Why:  Call office at [phone] to  set up appointment with Dr. Wynelle Link.   Contact information:   [address] [place] [address] [phone]       Signed: Arlee Muslim, PA-C Orthopaedic Surgery 11/27/2014, 10:47 AM

## 2014-11-19 NOTE — Progress Notes (Signed)
[facility] since [facility] at 2030. HVC #2 Unable to maintain suction with minimal drainage. Will pass along to am shift

## 2014-11-19 NOTE — Discharge Instructions (Signed)
Dr. Ollen GrossFrank Aluisio Total Joint Specialist [facility] [address] [place], [place] [id number] [phone]   HIP REPLACEMENT POSTOPERATIVE DIRECTIONS    Hip Rehabilitation, Guidelines Following Surgery  Remove items at home which could result in a fall. This includes throw rugs or furniture in walking pathways.  Continue medications as instructed at time of discharge.  You may have some home medications which will be placed on hold until you complete the course of blood thinner medication.  You may start showering once you are discharged home but do not submerge the incision under water. Just pat the incision dry and apply a dry gauze dressing on daily. Do not put on socks or shoes without following the instructions of your caregivers.  Sit on high chairs so your hips are not bent more than 90 degrees.  Sit on chairs with arms. Use the chair arms to help push yourself up when arising.  Keep your leg on the side of the operation out in front of you when standing up.  Arrange for the use of a toilet seat elevator so you are not sitting low.  Do not do any exercises or get in any positions that cause your toes to point in (pigeon toed).  Always sleep with a pillow between your legs. Do not lie on your side in sleep with both knees touching the bed.  Use walker as long as suggested by your caregivers.  You may put full weight on your legs and walk as much as is comfortable. Avoid periods of inactivity such as sitting longer than an hour when not asleep. This helps prevent blood clots.  You may return to work once you are cleared by Designer, industrial/productyour surgeon.  Do not drive a car for 6 weeks or until released by your surgeon.  Do not drive while taking narcotics.  Wear elastic stockings for three weeks following surgery during the day but you may remove then at night.  Make sure you keep all of your appointments after your operation with all of your doctors and caregivers. You  should call the office at the above phone number and make an appointment for approximately two weeks after the date of your surgery. Change the dressing daily and reapply a dry dressing each time. Please pick up a stool softener and laxative for home use as long as you are requiring pain medications.  ICE to the affected hip every three hours for 30 minutes at a time and then as needed for pain and swelling. Continue to use ice on the hip for pain and swelling from surgery. You may notice swelling that will progress down to the foot and ankle.  This is normal after  surgery.  Elevate the leg when you are not up walking on it.   It is important for you to complete the blood thinner medication as prescribed by your doctor.  Continue to use the breathing machine which will help keep your temperature down.  It is common for your temperature to cycle up and down following surgery, especially at night when you are not up moving around and exerting yourself.  The breathing machine keeps your lungs expanded and your temperature down.  RANGE OF MOTION AND STRENGTHENING EXERCISES  These exercises are designed to help you keep full movement of your hip joint. Follow your caregiver's or physical therapist's instructions. Perform all exercises about fifteen times, three times per day or as directed. Exercise both hips, even if you have had only  one joint replacement. These exercises can be done on a training (exercise) mat, on the floor, on a table or on a bed. Use whatever works the best and is most comfortable for you. Use music or television while you are exercising so that the exercises are a pleasant break in your day. This will make your life better with the exercises acting as a break in routine you can look forward to.  Lying on your back, slowly slide your foot toward your buttocks, raising your knee up off the floor. Then slowly slide your foot back down until your leg is straight again.  Lying on your back  spread your legs as far apart as you can without causing discomfort.  Lying on your side, raise your upper leg and foot straight up from the floor as far as is comfortable. Slowly lower the leg and repeat.  Lying on your back, tighten up the muscle in the front of your thigh (quadriceps muscles). You can do this by keeping your leg straight and trying to raise your heel off the floor. This helps strengthen the largest muscle supporting your knee.  Lying on your back, tighten up the muscles of your buttocks both with the legs straight and with the knee bent at a comfortable angle while keeping your heel on the floor.   SKILLED REHAB INSTRUCTIONS: If the patient is transferred to a skilled rehab facility following release from the hospital, a list of the current medications will be sent to the facility for the patient to continue.  When discharged from the skilled rehab facility, please have the facility set up the patient's Home Health Physical Therapy prior to being released. Also, the skilled facility will be responsible for providing the patient with their medications at time of release from the facility to include their pain medication, the muscle relaxants, and their blood thinner medication. If the patient is still at the rehab facility at time of the two week follow up appointment, the skilled rehab facility will also need to assist the patient in arranging follow up appointment in our office and any transportation needs.  MAKE SURE YOU:  Understand these instructions.  Will watch your condition.  Will get help right away if you are not doing well or get worse.  Pick up stool softner and laxative for home use following surgery while on pain medications. Do not submerge incision under water. Please use good hand washing techniques while changing dressing each day. May shower starting three days after surgery. Please use a clean towel to pat the incision dry following showers. Continue to use  ice for pain and swelling after surgery. Do not use any lotions or creams on the incision until instructed by your surgeon. Hip precautions.  Total Hip Protocol.  Resume Aspirin at home. May be weight bearing as tolerated to the left leg. Bed to chair transfers only.  No ambulation.  Postoperative Constipation Protocol  Constipation - defined medically as fewer than three stools per week and severe constipation as less than one stool per week.  One of the most common issues patients have following surgery is constipation.  Even if you have a regular bowel pattern at home, your normal regimen is likely to be disrupted due to multiple reasons following surgery.  Combination of anesthesia, postoperative narcotics, change in appetite and fluid intake all can affect your bowels.  In order to avoid complications following surgery, here are some recommendations in order to help you during your recovery period.  Colace (docusate) - Pick up an over-the-counter form of Colace or another stool softener and take twice a day as long as you are requiring postoperative pain medications.  Take with a full glass of water daily.  If you experience loose stools or diarrhea, hold the colace until you stool forms back up.  If your symptoms do not get better within 1 week or if they get worse, check with your doctor.  Dulcolax (bisacodyl) - Pick up over-the-counter and take as directed by the product packaging as needed to assist with the movement of your bowels.  Take with a full glass of water.  Use this product as needed if not relieved by Colace only.   MiraLax (polyethylene glycol) - Pick up over-the-counter to have on hand.  MiraLax is a solution that will increase the amount of water in your bowels to assist with bowel movements.  Take as directed and can mix with a glass of water, juice, soda, coffee, or tea.  Take if you go more than two days without a movement. Do not use MiraLax more than once per day. Call  your doctor if you are still constipated or irregular after using this medication for 7 days in a row.  If you continue to have problems with postoperative constipation, please contact the office for further assistance and recommendations.  If you experience "the worst abdominal pain ever" or develop nausea or vomiting, please contact the office immediatly for further recommendations for treatment.

## 2014-11-19 NOTE — Progress Notes (Signed)
Subjective: 2 Days Post-Op Procedure(s) (LRB): IRRIGATION AND DEBRIDEMENT AND REIMPLANTATION OF CEMENT SPACER HIP (Left) Patient reports pain as mild.   Patient seen in rounds by Dr. Lequita HaltAluisio. Patient is well, but has had some minor complaints of pain in the hip, requiring pain medications Patient is ready to go home today.  Objective: Vital signs in last 24 hours: Temp:  [97.9 F (36.6 C)-99.5 F (37.5 C)] 98.3 F (36.8 C) (01/25 0605) Pulse Rate:  [84-97] 84 (01/25 0605) Resp:  [16-20] 19 (01/25 0605) BP: (107-133)/(45-54) 113/50 mmHg (01/25 0605) SpO2:  [93 %-97 %] 97 % (01/25 0605)  Intake/Output from previous day:  Intake/Output Summary (Last 24 hours) at 11/19/14 0710 Last data filed at 11/19/14 0606  Gross per 24 hour  Intake    840 ml  Output   1811 ml  Net   -971 ml    Labs:  Recent Labs  11/18/14 0510 11/19/14 0523  HGB 9.4* 9.3*    Recent Labs  11/18/14 0510 11/19/14 0523  WBC 3.9* 2.7*  RBC 3.17* 3.16*  HCT 29.2* 29.1*  PLT 163 155    Recent Labs  11/18/14 0510 11/19/14 0523  NA 133* 136  K 4.4 4.5  CL 103 102  CO2 25 28  BUN 13 15  CREATININE 0.62 0.56  GLUCOSE 113* 108*  CALCIUM 8.1* 8.5   No results for input(s): LABPT, INR in the last 72 hours.  EXAM: General - Patient is Alert and Appropriate Extremity - Neurovascular intact Sensation intact distally Dorsiflexion/Plantar flexion intact Incision - clean, dry, no drainage Motor Function - intact, moving foot and toes well on exam.   Assessment/Plan: 2 Days Post-Op Procedure(s) (LRB): IRRIGATION AND DEBRIDEMENT AND REIMPLANTATION OF CEMENT SPACER HIP (Left) Procedure(s) (LRB): IRRIGATION AND DEBRIDEMENT AND REIMPLANTATION OF CEMENT SPACER HIP (Left) Past Medical History  Diagnosis Date  . Tenosynovitis of fingers     RIGHT RING [facility]AD [facility]  . Bronchitis     dx  07-04-2014 (approx) will finished antibiotic 07-14-2014  .  History of concussion     x4  --  no residual  . Migraines   . Depression   . 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 GERD (gastroesophageal reflux disease)   . H/O hiatal hernia   . Chronic constipation   . Chronic narcotic use   . Nocturia   . SUI (stress urinary incontinence, female)   . OA (osteoarthritis)     hips, knees, hips  . Infection of left prosthetic hip joint     left total arthroplasty done 2012/  jan 2015 removal of prosthesis  . Full dentures   . History of cellulitis     lower extremities  . Thinning of skin     easily tears  . Venous stasis dermatitis     BILATERAL LEGS  . Productive cough     RESOLVED - QUIT SMOKING  . Memory problem   . OSA on CPAP     study x3   last one few yrs ago  cpap recommended -- DOES NOT USE OR HAVE CPAP   Active Problems:   Left hip prosthetic joint infection  Estimated body mass index is 34.29 kg/(m^2) as calculated from the following:   Height as of this encounter: 5\' 10"  (1.778 m).   Weight as of this encounter:  108.41 kg (239 lb). Discharge home with home health Diet - Regular diet Follow up - in 2 weeks Activity - WBAT, Bed to chair transfers only.  No ambulation at this time. Disposition - Home Condition Upon Discharge - Stable D/C Meds - See DC Summary DVT Prophylaxis - Aspirin  Avel Peace, PA-C Orthopaedic Surgery 11/19/2014, 7:10 AM

## 2014-11-20 LAB — CULTURE, ROUTINE-ABSCESS

## 2014-11-22 LAB — ANAEROBIC CULTURE

## 2014-11-27 ENCOUNTER — Ambulatory Visit: Payer: Medicare Other | Admitting: Internal Medicine

## 2014-11-29 ENCOUNTER — Telehealth: Payer: Self-pay | Admitting: *Deleted

## 2014-11-29 DIAGNOSIS — T8452XA Infection and inflammatory reaction due to internal left hip prosthesis, initial encounter: Secondary | ICD-10-CM

## 2014-11-29 MED ORDER — RIFAMPIN 300 MG PO CAPS: 300.0000 mg | ORAL_CAPSULE | Freq: Two times a day (BID) | ORAL | Status: DC

## 2014-11-29 NOTE — Addendum Note (Signed)
Addended by: Jennet MaduroESTRIDGE, [place] D on: 11/29/2014 01:46 PM   Modules accepted: Orders

## 2014-11-29 NOTE — Telephone Encounter (Signed)
Informed Pharmacy re: clindamycin.  Refilled Rifampin.

## 2014-11-29 NOTE — Telephone Encounter (Signed)
MD, OK to refill Clindamycin and Rifampin rxes and how many refills?

## 2014-11-29 NOTE — Telephone Encounter (Signed)
She should be on doxycycline 100 mg twice daily and rifampin 600 mg daily at least until her visit with me on 12/04/2014. She should not be on clindamycin.

## 2014-12-04 ENCOUNTER — Ambulatory Visit (INDEPENDENT_AMBULATORY_CARE_PROVIDER_SITE_OTHER): Payer: Medicare Other | Admitting: Internal Medicine

## 2014-12-04 ENCOUNTER — Other Ambulatory Visit: Payer: Self-pay | Admitting: *Deleted

## 2014-12-04 ENCOUNTER — Encounter: Payer: Self-pay | Admitting: Internal Medicine

## 2014-12-04 VITALS — BP 124/77 | HR 92 | Temp 98.0°F | Wt 250.8 lb

## 2014-12-04 DIAGNOSIS — T8452XA Infection and inflammatory reaction due to internal left hip prosthesis, initial encounter: Secondary | ICD-10-CM

## 2014-12-04 MED ORDER — RIFAMPIN 300 MG PO CAPS: 300.0000 mg | ORAL_CAPSULE | Freq: Two times a day (BID) | ORAL | Status: DC

## 2014-12-04 NOTE — Progress Notes (Signed)
Patient ID: [name], female   DOB: 1956-08-02, 59 y.o.   MRN: [id number]         [facility]  Patient Active Problem List   Diagnosis Date Noted  . Left hip prosthetic joint infection 11/15/2014  . Hip dislocation, left 10/12/2014  . Left hip pain 10/12/2014  . Pancytopenia 10/12/2014  . Depression   . GERD (gastroesophageal reflux disease)   . OA (osteoarthritis)   . Gastroesophageal reflux disease with esophagitis   . OSA on CPAP   . Septic arthritis of hip 08/24/2014    Patient's Medications  New Prescriptions   No medications on file  Previous Medications   ACETAMINOPHEN (TYLENOL) 325 MG TABLET    Take 2 tablets (650 mg total) by mouth every 6 (six) hours as needed for mild pain (or Fever >/= 101).   AMINO ACIDS-PROTEIN HYDROLYS (FEEDING SUPPLEMENT, PRO-STAT SUGAR FREE 64,) LIQD    Take 30 mLs by mouth 3 (three) times daily with meals.   ASCORBIC ACID (VITAMIN C) 100 MG TABLET    Take 100 mg by mouth daily.   ASPIRIN 325 MG TABLET    Take 325 mg by mouth daily.    B COMPLEX VITAMINS TABLET    Take 1 tablet by mouth daily.   CHOLECALCIFEROL 400 UNITS TABLET    Take 2 tablets (800 Units total) by mouth daily.   CLOTRIMAZOLE-BETAMETHASONE (LOTRISONE) CREAM    Apply 1 application topically 2 (two) times daily as needed (rash in skin folds).   DOXYCYCLINE (VIBRA-TABS) 100 MG TABLET    Take 1 tablet (100 mg total) by mouth 2 (two) times daily.   DULOXETINE (CYMBALTA) 60 MG CAPSULE    Take 60 mg by mouth daily.    FERROUS SULFATE 325 (65 FE) MG EC TABLET    Take 325 mg by mouth daily.   FUROSEMIDE (LASIX) 40 MG TABLET    Take 40 mg by mouth 2 (two) times daily as needed (fluid retention).   GABAPENTIN (NEURONTIN) 400 MG CAPSULE    Take 2 capsules (800 mg total) by mouth 2 (two) times daily.   HYDROXYZINE (ATARAX/VISTARIL) 25 MG TABLET    Take 25 mg by mouth every 6 (six) hours as needed for anxiety (anxiety).    METHOCARBAMOL (ROBAXIN) 500 MG  TABLET    Take 1 tablet (500 mg total) by mouth every 6 (six) hours as needed for muscle spasms.   METOCLOPRAMIDE (REGLAN) 5 MG TABLET    Take 1-2 tablets (5-10 mg total) by mouth every 8 (eight) hours as needed for nausea (if ondansetron (ZOFRAN) ineffective.).   MULTIPLE VITAMIN (MULTIVITAMIN) TABLET    Take 1 tablet by mouth at bedtime.    NALOXEGOL OXALATE (MOVANTIK PO)    Take 25 mg by mouth at bedtime.    NICOTINE POLACRILEX (COMMIT) 4 MG LOZENGE    Take 4 mg by mouth as needed for smoking cessation (smoking cessation).    OMEGA-3 FATTY ACIDS (FISH OIL) 1000 MG CAPS    Take 1 capsule by mouth daily.   OMEPRAZOLE (PRILOSEC) 40 MG CAPSULE    Take 40 mg by mouth daily.   ONDANSETRON (ZOFRAN) 4 MG TABLET    Take 1 tablet (4 mg total) by mouth every 6 (six) hours as needed for nausea.   OXYCODONE HCL 10 MG TABS    Take 1-2 tablets (10-20 mg total) by mouth every 3 (three) hours as needed (pain).   OXYMORPHONE (OPANA ER) 20 MG 12  HR TABLET    Take 1 tablet (20 mg total) by mouth 3 (three) times daily.   PROMETHAZINE (PHENERGAN) 12.5 MG TABLET    Take 1 tablet (12.5 mg total) by mouth every 6 (six) hours as needed for nausea or vomiting.   RIFAMPIN (RIFADIN) 300 MG CAPSULE    Take 1 capsule (300 mg total) by mouth 2 (two) times daily.   SODIUM CHLORIDE (OCEAN) 0.65 % SOLN NASAL SPRAY    Place 2 sprays into both nostrils daily as needed for congestion.   SOLIFENACIN (VESICARE) 10 MG TABLET    Take 10 mg by mouth at bedtime.    SUMATRIPTAN (IMITREX) 100 MG TABLET    Take 100 mg by mouth once. May repeat in 2 hours if headache persists or recurs. NO MORE THAN 2 IN A 24 HOUR PERIOD   TRAZODONE (DESYREL) 150 MG TABLET    Take 150-300 mg by mouth at bedtime.    TRIAMCINOLONE CREAM (KENALOG) 0.1 %    Apply 1 application topically 2 (two) times daily. CALF OF BOTH LEGS - BECAUSE OF SKIN IRRITATION THOUGHT TO BE DUE TO SWELLING OF LEGS   ZINC GLUCONATE 50 MG TABLET    Take 50 mg by mouth daily.  Modified  Medications   No medications on file  Discontinued Medications   No medications on file    Subjective: [name] and is in for her hospital follow-up visit. She continues to struggle with problems related to her left prosthetic hip. She's been off and on antibiotics for much of the past year, including the last 98 days. She was rehospitalized recently after multiple falls and dislocation of her hip. She underwent incision and drainage and hematoma was encountered. Operative cultures grew coagulase-negative staph of uncertain significance. She continues on oral doxycycline. She ran out of rifampin recently. She's not having any fever, chills or sweats. She's not having any diarrhea. She states that she continues to have bloody drainage from her left hip incision. Her staples were removed this morning. Review of Systems: Pertinent items are noted in HPI.  Past Medical History  Diagnosis Date  . Tenosynovitis of fingers     RIGHT RING AND SMALL-HAD SURGERY 07/12/14 [facility]  . Bronchitis     dx  07-04-2014 (approx) will finished antibiotic 07-14-2014  . History of concussion     x4  --  no residual  . Migraines   . Depression   . 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 GERD (gastroesophageal reflux disease)   . H/O hiatal hernia   . Chronic constipation   . Chronic narcotic use   . Nocturia   . SUI (stress urinary incontinence, female)   . OA (osteoarthritis)     hips, knees, hips  . Infection of left prosthetic hip joint     left total arthroplasty done 2012/  jan 2015 removal of prosthesis  . Full dentures   . History of cellulitis     lower extremities  . Thinning of skin     easily tears  . Venous stasis dermatitis     BILATERAL LEGS  . Productive cough     RESOLVED - QUIT SMOKING  . Memory problem   . OSA on CPAP     study x3   last one few yrs ago  cpap recommended --  DOES NOT USE  OR HAVE CPAP    History  Substance Use Topics  . Smoking status: Former Smoker -- 1.00 packs/day for 30 years    Types: Cigarettes    Quit date: 07/09/2014  . Smokeless tobacco: Never Used     Comment: PT STATES ATTEMPTING TO QUIT SMOKING 07-09-2014--  USING NICOTIN LOZENGERS  . Alcohol Use: No    Family History  Problem Relation Age of Onset  . COPD Mother   . Hypertension Father   . Pancreatic cancer Father   . COPD Brother     Allergies  Allergen Reactions  . Vancomycin Other (See Comments)    LEUKOPENIA    Objective: Temp: 98 F (36.7 C) (02/09 1427) Temp Source: Oral (02/09 1427) BP: 124/77 mmHg (02/09 1427) Pulse Rate: 92 (02/09 1427)  General: She is seated in her wheelchair Skin: Left hip gauze dressing is clean and dry. Incision looks good without any current drainage   Lab Results Sedimentation rate 11/29/2014: 92 C-reactive protein 11/29/2014: 2.9   Assessment: His heart no she has active infection. I will continue doxycycline and rifampin for now.  Plan: 1. Continue doxycycline and rifampin 2. Follow-up in 3-4 weeks   Cliffton AstersJohn Alanya Vukelich, MD [facility] [phone] pager   [phone] cell 12/04/2014, 3:09 PM

## 2015-01-01 ENCOUNTER — Ambulatory Visit (INDEPENDENT_AMBULATORY_CARE_PROVIDER_SITE_OTHER): Payer: Medicare Other | Admitting: Internal Medicine

## 2015-01-01 ENCOUNTER — Encounter: Payer: Self-pay | Admitting: Internal Medicine

## 2015-01-01 DIAGNOSIS — M009 Pyogenic arthritis, unspecified: Secondary | ICD-10-CM

## 2015-01-01 LAB — C-REACTIVE PROTEIN: CRP: 1.3 mg/dL — AB (ref <0.60)

## 2015-01-01 NOTE — Progress Notes (Signed)
Patient ID: [name], female   DOB: 1956-08-02, 59 y.o.   MRN: [id number]         [facility]  Patient Active Problem List   Diagnosis Date Noted  . Left hip prosthetic joint infection 11/15/2014  . Hip dislocation, left 10/12/2014  . Left hip pain 10/12/2014  . Pancytopenia 10/12/2014  . Depression   . GERD (gastroesophageal reflux disease)   . OA (osteoarthritis)   . Gastroesophageal reflux disease with esophagitis   . OSA on CPAP   . Septic arthritis of hip 08/24/2014    Patient's Medications  New Prescriptions   No medications on file  Previous Medications   ACETAMINOPHEN (TYLENOL) 325 MG TABLET    Take 2 tablets (650 mg total) by mouth every 6 (six) hours as needed for mild pain (or Fever >/= 101).   AMINO ACIDS-PROTEIN HYDROLYS (FEEDING SUPPLEMENT, PRO-STAT SUGAR FREE 64,) LIQD    Take 30 mLs by mouth 3 (three) times daily with meals.   ASCORBIC ACID (VITAMIN C) 100 MG TABLET    Take 100 mg by mouth daily.   ASPIRIN 325 MG TABLET    Take 325 mg by mouth daily.    B COMPLEX VITAMINS TABLET    Take 1 tablet by mouth daily.   CHOLECALCIFEROL 400 UNITS TABLET    Take 2 tablets (800 Units total) by mouth daily.   CLOTRIMAZOLE-BETAMETHASONE (LOTRISONE) CREAM    Apply 1 application topically 2 (two) times daily as needed (rash in skin folds).   DOXYCYCLINE (VIBRA-TABS) 100 MG TABLET    Take 1 tablet (100 mg total) by mouth 2 (two) times daily.   DULOXETINE (CYMBALTA) 60 MG CAPSULE    Take 60 mg by mouth daily.    FERROUS SULFATE 325 (65 FE) MG EC TABLET    Take 325 mg by mouth daily.   FUROSEMIDE (LASIX) 40 MG TABLET    Take 40 mg by mouth 2 (two) times daily as needed (fluid retention).   GABAPENTIN (NEURONTIN) 400 MG CAPSULE    Take 2 capsules (800 mg total) by mouth 2 (two) times daily.   HYDROXYZINE (ATARAX/VISTARIL) 25 MG TABLET    Take 25 mg by mouth every 6 (six) hours as needed for anxiety (anxiety).    METHOCARBAMOL (ROBAXIN) 500 MG  TABLET    Take 1 tablet (500 mg total) by mouth every 6 (six) hours as needed for muscle spasms.   METOCLOPRAMIDE (REGLAN) 5 MG TABLET    Take 1-2 tablets (5-10 mg total) by mouth every 8 (eight) hours as needed for nausea (if ondansetron (ZOFRAN) ineffective.).   MULTIPLE VITAMIN (MULTIVITAMIN) TABLET    Take 1 tablet by mouth at bedtime.    NALOXEGOL OXALATE (MOVANTIK PO)    Take 25 mg by mouth at bedtime.    NICOTINE POLACRILEX (COMMIT) 4 MG LOZENGE    Take 4 mg by mouth as needed for smoking cessation (smoking cessation).    OMEGA-3 FATTY ACIDS (FISH OIL) 1000 MG CAPS    Take 1 capsule by mouth daily.   OMEPRAZOLE (PRILOSEC) 40 MG CAPSULE    Take 40 mg by mouth daily.   ONDANSETRON (ZOFRAN) 4 MG TABLET    Take 1 tablet (4 mg total) by mouth every 6 (six) hours as needed for nausea.   OXYCODONE HCL 10 MG TABS    Take 1-2 tablets (10-20 mg total) by mouth every 3 (three) hours as needed (pain).   OXYMORPHONE (OPANA ER) 20 MG 12  HR TABLET    Take 1 tablet (20 mg total) by mouth 3 (three) times daily.   PROMETHAZINE (PHENERGAN) 12.5 MG TABLET    Take 1 tablet (12.5 mg total) by mouth every 6 (six) hours as needed for nausea or vomiting.   RIFAMPIN (RIFADIN) 300 MG CAPSULE    Take 1 capsule (300 mg total) by mouth 2 (two) times daily.   SODIUM CHLORIDE (OCEAN) 0.65 % SOLN NASAL SPRAY    Place 2 sprays into both nostrils daily as needed for congestion.   SOLIFENACIN (VESICARE) 10 MG TABLET    Take 10 mg by mouth at bedtime.    SUMATRIPTAN (IMITREX) 100 MG TABLET    Take 100 mg by mouth once. May repeat in 2 hours if headache persists or recurs. NO MORE THAN 2 IN A 24 HOUR PERIOD   TRAZODONE (DESYREL) 150 MG TABLET    Take 150-300 mg by mouth at bedtime.    TRIAMCINOLONE CREAM (KENALOG) 0.1 %    Apply 1 application topically 2 (two) times daily. CALF OF BOTH LEGS - BECAUSE OF SKIN IRRITATION THOUGHT TO BE DUE TO SWELLING OF LEGS   ZINC GLUCONATE 50 MG TABLET    Take 50 mg by mouth daily.  Modified  Medications   No medications on file  Discontinued Medications   No medications on file    Subjective: [name] is in for her routine follow-up visit for her chronically infected left hip. She has now been on antibiotics for most of the past year including continuous antibiotic therapy for the last 4 months. She had all old hardware removed from her left hip 6 weeks ago. She's been taking empiric doxycycline and rifampin. All recent operative cultures and stains have been negative. She is no longer having any drainage from her left hip wound.  Review of Systems: Pertinent items are noted in HPI.  Past Medical History  Diagnosis Date  . Tenosynovitis of fingers     RIGHT RING AND SMALL-HAD SURGERY 07/12/14 [facility]  . Bronchitis     dx  07-04-2014 (approx) will finished antibiotic 07-14-2014  . History of concussion     x4  --  no residual  . Migraines   . Depression   . 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 GERD (gastroesophageal reflux disease)   . H/O hiatal hernia   . Chronic constipation   . Chronic narcotic use   . Nocturia   . SUI (stress urinary incontinence, female)   . OA (osteoarthritis)     hips, knees, hips  . Infection of left prosthetic hip joint     left total arthroplasty done 2012/  jan 2015 removal of prosthesis  . Full dentures   . History of cellulitis     lower extremities  . Thinning of skin     easily tears  . Venous stasis dermatitis     BILATERAL LEGS  . Productive cough     RESOLVED - QUIT SMOKING  . Memory problem   . OSA on CPAP     study x3   last one few yrs ago  cpap recommended -- DOES NOT USE OR HAVE CPAP    History  Substance Use Topics  . Smoking status: Former Smoker -- 1.00 packs/day for 30 years    Types: Cigarettes    Quit date: 07/09/2014  .  Smokeless tobacco: Never Used     Comment: PT STATES ATTEMPTING TO QUIT SMOKING  07-09-2014--  USING NICOTIN LOZENGERS  . Alcohol Use: No    Family History  Problem Relation Age of Onset  . COPD Mother   . Hypertension Father   . Pancreatic cancer Father   . COPD Brother     Allergies  Allergen Reactions  . Vancomycin Other (See Comments)    LEUKOPENIA    Objective: Temp: 98.7 F (37.1 C) (03/08 1420) Temp Source: Oral (03/08 1420) BP: 132/74 mmHg (03/08 1420) Pulse Rate: 98 (03/08 1420)  General: She is seated in her wheelchair Her left hip incision has healed. There is no drainage on her dressing. There is no surrounding cellulitis or fluctuance   Assessment: I'm hopeful that any residual left hip infection has now been cured by her recent surgery and antibiotic therapy. I think it is time to stop antibiotics and see how she does.  Plan: 1. Discontinue doxycycline and rifampin 2. Follow-up in 6 weeks   Cliffton Asters, MD [facility] [phone] pager   [phone] cell 01/01/2015, 3:02 PM

## 2015-01-02 LAB — SEDIMENTATION RATE: Sed Rate: 86 mm/hr — ABNORMAL HIGH (ref 0–30)

## 2015-01-15 ENCOUNTER — Telehealth: Payer: Self-pay | Admitting: *Deleted

## 2015-01-15 NOTE — Telephone Encounter (Signed)
Please let her know that her inflammatory markers remain elevated but that her C-reactive protein has improved significantly. I recommend that we continue observation off of antibiotics and have her follow-up with me as scheduled next month. Please ask her how her left hip wound is doing and how she is feeling.

## 2015-01-15 NOTE — Telephone Encounter (Signed)
Patient is calling for lab results and advice on her next steps.  Andree CossHowell, Giannis Corpuz M, RN

## 2015-01-16 NOTE — Telephone Encounter (Signed)
Per the patient, her wound is healed. She still has some pain in the joint, feels tired and "crappy."  She states that Dr. Tana FeltsAlusio's office contacted her regarding repeat bloodwork next week.  [facility].

## 2015-02-14 ENCOUNTER — Encounter: Payer: Self-pay | Admitting: Internal Medicine

## 2015-02-14 ENCOUNTER — Ambulatory Visit (INDEPENDENT_AMBULATORY_CARE_PROVIDER_SITE_OTHER): Payer: Medicare Other | Admitting: Internal Medicine

## 2015-02-14 VITALS — BP 151/84 | HR 85 | Temp 97.8°F | Wt 233.8 lb

## 2015-02-14 DIAGNOSIS — T8452XA Infection and inflammatory reaction due to internal left hip prosthesis, initial encounter: Secondary | ICD-10-CM

## 2015-02-14 DIAGNOSIS — M009 Pyogenic arthritis, unspecified: Secondary | ICD-10-CM

## 2015-02-14 NOTE — Progress Notes (Signed)
Patient ID: [name], female   DOB: 06-03-1956, 59 y.o.   MRN: [id number]         [facility] for Infectious Disease  Patient Active Problem List   Diagnosis Date Noted  . Left hip prosthetic joint infection 11/15/2014  . Hip dislocation, left 10/12/2014  . Left hip pain 10/12/2014  . Pancytopenia 10/12/2014  . Depression   . GERD (gastroesophageal reflux disease)   . OA (osteoarthritis)   . Gastroesophageal reflux disease with esophagitis   . OSA on CPAP   . Septic arthritis of hip 08/24/2014    Patient's Medications  New Prescriptions   No medications on file  Previous Medications   ACETAMINOPHEN (TYLENOL) 325 MG TABLET    Take 2 tablets (650 mg total) by mouth every 6 (six) hours as needed for mild pain (or Fever >/= 101).   AMINO ACIDS-PROTEIN HYDROLYS (FEEDING SUPPLEMENT, PRO-STAT SUGAR FREE 64,) LIQD    Take 30 mLs by mouth 3 (three) times daily with meals.   ASCORBIC ACID (VITAMIN C) 100 MG TABLET    Take 100 mg by mouth daily.   ASPIRIN 325 MG TABLET    Take 325 mg by mouth daily.    B COMPLEX VITAMINS TABLET    Take 1 tablet by mouth daily.   CHOLECALCIFEROL 400 UNITS TABLET    Take 2 tablets (800 Units total) by mouth daily.   CLOTRIMAZOLE-BETAMETHASONE (LOTRISONE) CREAM    Apply 1 application topically 2 (two) times daily as needed (rash in skin folds).   DULOXETINE (CYMBALTA) 60 MG CAPSULE    Take 60 mg by mouth daily.    FERROUS SULFATE 325 (65 FE) MG EC TABLET    Take 325 mg by mouth daily.   FUROSEMIDE (LASIX) 40 MG TABLET    Take 40 mg by mouth 2 (two) times daily as needed (fluid retention).   GABAPENTIN (NEURONTIN) 400 MG CAPSULE    Take 2 capsules (800 mg total) by mouth 2 (two) times daily.   HYDROXYZINE (ATARAX/VISTARIL) 25 MG TABLET    Take 25 mg by mouth every 6 (six) hours as needed for anxiety (anxiety).    METHOCARBAMOL (ROBAXIN) 500 MG TABLET    Take 1 tablet (500 mg total) by mouth every 6 (six) hours as needed for muscle spasms.   METOCLOPRAMIDE (REGLAN) 5 MG TABLET    Take 1-2 tablets (5-10 mg total) by mouth every 8 (eight) hours as needed for nausea (if ondansetron (ZOFRAN) ineffective.).   MULTIPLE VITAMIN (MULTIVITAMIN) TABLET    Take 1 tablet by mouth at bedtime.    NALOXEGOL OXALATE (MOVANTIK PO)    Take 25 mg by mouth at bedtime.    NICOTINE POLACRILEX (COMMIT) 4 MG LOZENGE    Take 4 mg by mouth as needed for smoking cessation (smoking cessation).    OMEGA-3 FATTY ACIDS (FISH OIL) 1000 MG CAPS    Take 1 capsule by mouth daily.   OMEPRAZOLE (PRILOSEC) 40 MG CAPSULE    Take 40 mg by mouth daily.   ONDANSETRON (ZOFRAN) 4 MG TABLET    Take 1 tablet (4 mg total) by mouth every 6 (six) hours as needed for nausea.   OXYCODONE HCL 10 MG TABS    Take 1-2 tablets (10-20 mg total) by mouth every 3 (three) hours as needed (pain).   OXYMORPHONE (OPANA ER) 20 MG 12 HR TABLET    Take 1 tablet (20 mg total) by mouth 3 (three) times daily.   PROMETHAZINE (PHENERGAN) 12.5 MG  TABLET    Take 1 tablet (12.5 mg total) by mouth every 6 (six) hours as needed for nausea or vomiting.   SODIUM CHLORIDE (OCEAN) 0.65 % SOLN NASAL SPRAY    Place 2 sprays into both nostrils daily as needed for congestion.   SOLIFENACIN (VESICARE) 10 MG TABLET    Take 10 mg by mouth at bedtime.    SUMATRIPTAN (IMITREX) 100 MG TABLET    Take 100 mg by mouth once. May repeat in 2 hours if headache persists or recurs. NO MORE THAN 2 IN A 24 HOUR PERIOD   TRAZODONE (DESYREL) 150 MG TABLET    Take 150-300 mg by mouth at bedtime.    TRIAMCINOLONE CREAM (KENALOG) 0.1 %    Apply 1 application topically 2 (two) times daily. CALF OF BOTH LEGS - BECAUSE OF SKIN IRRITATION THOUGHT TO BE DUE TO SWELLING OF LEGS   ZINC GLUCONATE 50 MG TABLET    Take 50 mg by mouth daily.  Modified Medications   No medications on file  Discontinued Medications   DOXYCYCLINE (VIBRA-TABS) 100 MG TABLET    Take 1 tablet (100 mg total) by mouth 2 (two) times daily.   RIFAMPIN (RIFADIN) 300 MG  CAPSULE    Take 1 capsule (300 mg total) by mouth 2 (two) times daily.    Subjective: [name] is in for routine follow-up of her chronic left hip infection. She's been off of all antibiotics since 01/01/2015. She is improving clinically. She is having less pain. She has not had any fever, chills or sweats. The wound is healed and there has been no drainage.  Review of Systems: Pertinent items are noted in HPI.  Past Medical History  Diagnosis Date  . Tenosynovitis of fingers     RIGHT RING AND SMALL-HAD SURGERY 07/12/14 [facility]  . Bronchitis     dx  07-04-2014 (approx) will finished antibiotic 07-14-2014  . History of concussion     x4  --  no residual  . Migraines   . Depression   . 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 GERD (gastroesophageal reflux disease)   . H/O hiatal hernia   . Chronic constipation   . Chronic narcotic use   . Nocturia   . SUI (stress urinary incontinence, female)   . OA (osteoarthritis)     hips, knees, hips  . Infection of left prosthetic hip joint     left total arthroplasty done 2012/  jan 2015 removal of prosthesis  . Full dentures   . History of cellulitis     lower extremities  . Thinning of skin     easily tears  . Venous stasis dermatitis     BILATERAL LEGS  . Productive cough     RESOLVED - QUIT SMOKING  . Memory problem   . OSA on CPAP     study x3   last one few yrs ago  cpap recommended -- DOES NOT USE OR HAVE CPAP    History  Substance Use Topics  . Smoking status: Former Smoker -- 1.00 packs/day for 30 years    Types: Cigarettes    Quit date: 07/09/2014  . Smokeless tobacco: Never Used     Comment: PT STATES ATTEMPTING TO QUIT SMOKING 07-09-2014--  USING NICOTIN LOZENGERS  . Alcohol Use: No    Family History  Problem Relation Age  of Onset  . COPD Mother   . Hypertension Father   . Pancreatic cancer Father   . COPD  Brother     Allergies  Allergen Reactions  . Vancomycin Other (See Comments)    LEUKOPENIA    Objective: Temp: 97.8 F (36.6 C) (04/21 0850) Temp Source: Oral (04/21 0850) BP: 151/84 mmHg (04/21 0850) Pulse Rate: 85 (04/21 0850)  General: She is in better spirits seated in her wheelchair Her extensive left hip infection is healed without any open areas or drainage. There is some induration of the tissue anterior to the proximal margin of the hip but this is unchanged. The hip is slightly warm to touch but there is no erythema. There is no fluctuance.   [facility]  Result Impression   1. Ill-defined peritrochanteric fluid collection which communicates with the joint space. Sterility is strictly indeterminate by imaging alone. If there is clinical concern for infection aspiration should be considered. 2. Status post left total hip arthroplasty with lucency along the femoral and acetabular components concerning for loosening. Remote periprosthetic fracture along the femoral stem status posterior cerclage wire placement. 3. Remote left acetabular fracture status post hardware removal with incomplete bony union. .   Result Narrative  CT LEFT HIP WITHOUT CONTRAST Feb 13, 2015 01:39:37 PM . INDICATION:  SUGERY ? [id number] Absence of hip joint, left  COMPARISON: None . TECHNIQUE: Thin-section images of the left hip were obtained without contrast. Supplemental 2D reformatted images were generated and reviewed as needed. Marland Kitchen. FINDINGS: Remote left acetabular fracture status post hardware removal. A portion of the medial acetabular roof fracture demonstrates incomplete bony consolidation. Status post left total hip arthroplasty with polyethylene acetabular component. Hardware is intact. Remote periprosthetic fracture along the femoral stem with cerclage wire, incompletely imaged. Moderate degenerative changes of the left sacroiliac  joint. Fatty atrophy of the left gluteus muscles.  There is an ill-defined fluid collection along the greater trochanter which communicates with the hip joint. Findings could reflect postoperative seroma however sterility is strictly indeterminate by imaging alone. No evidence for soft tissue or intra-articular gas. Skin thickening and subcutaneous stranding along the left hip may reflect postoperative changes although cellulitis could appear similar.       Assessment: She is improving clinically but yesterday CT scan shows a possible fluid collection deep in the hip. I will talk to Dr. Despina HickAlusio about possible aspiration of the collection for Gram stain and cultures.  Plan: 1. Observe off of antibiotics 2. Consider aspiration of possible fluid collection by interventional radiology   Cliffton AstersJohn Aashna Matson, MD [facility] [phone] pager   [phone] cell 02/14/2015, 9:05 AM

## 2015-03-18 ENCOUNTER — Telehealth: Payer: Self-pay | Admitting: *Deleted

## 2015-03-18 NOTE — Telephone Encounter (Signed)
Patient called to cancel her appt for 03/21/15; stating there was nothing to discuss with the MD. She said if he wanted to order blood work that would be fine. She would want to get it done at [facility]. She feels that because there is no recent lab work there is nothing to talk about. [name]

## 2015-03-21 ENCOUNTER — Ambulatory Visit: Payer: Medicare Other | Admitting: Internal Medicine

## 2015-05-27 IMAGING — CR DG HIP (WITH OR WITHOUT PELVIS) 2-3V*L*
5 series · 5 of 5 positions shown · non-contrast
Comparison: 08/29/2014

CLINICAL DATA: Left hip pain.

EXAM:
LEFT HIP - COMPLETE 2+ VIEW

[t pelvis ap]
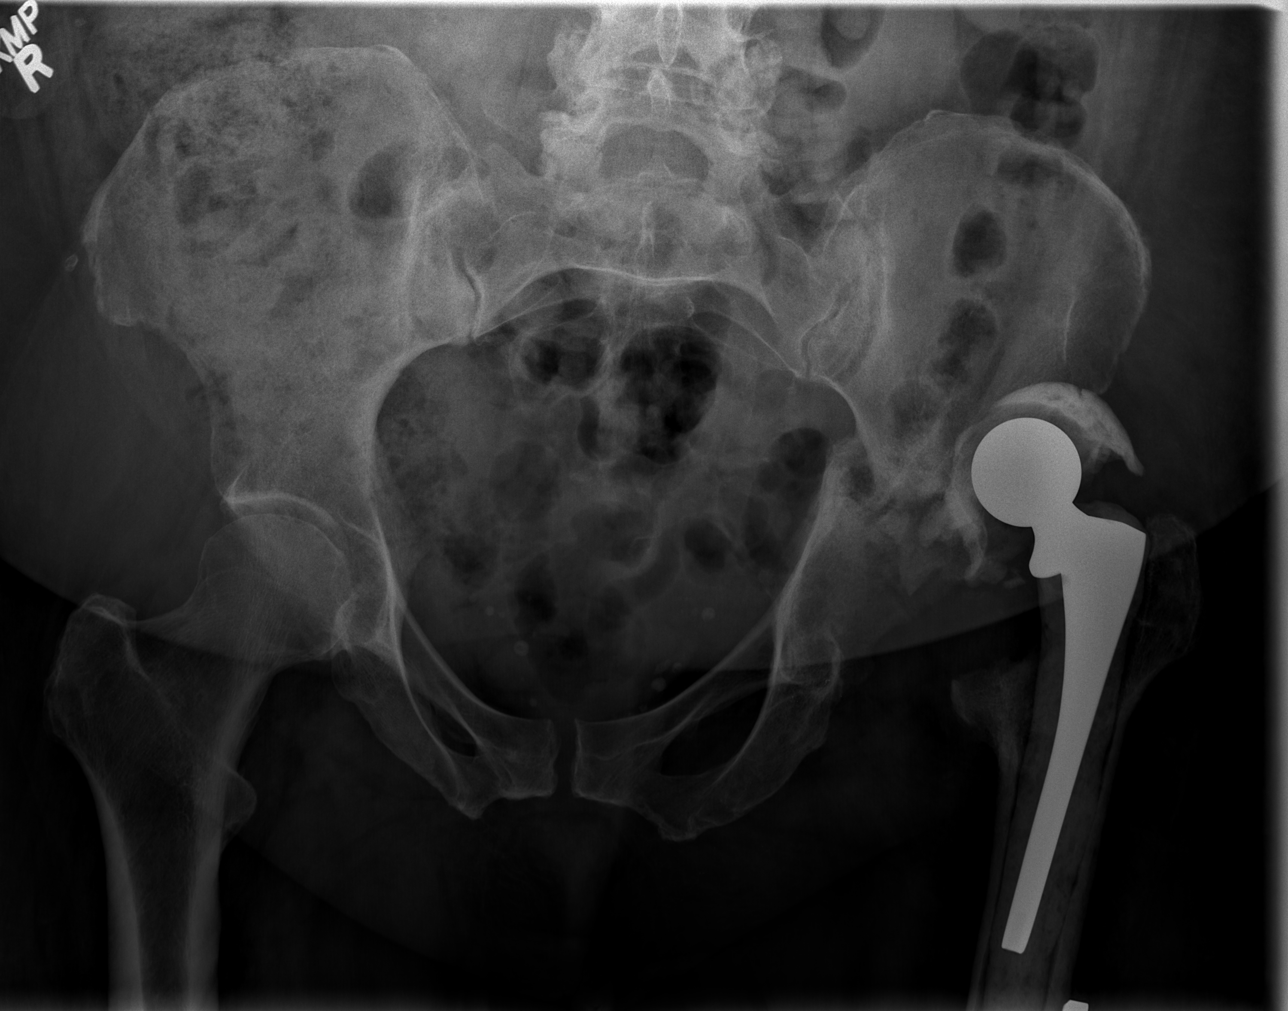

[t hip ap left]
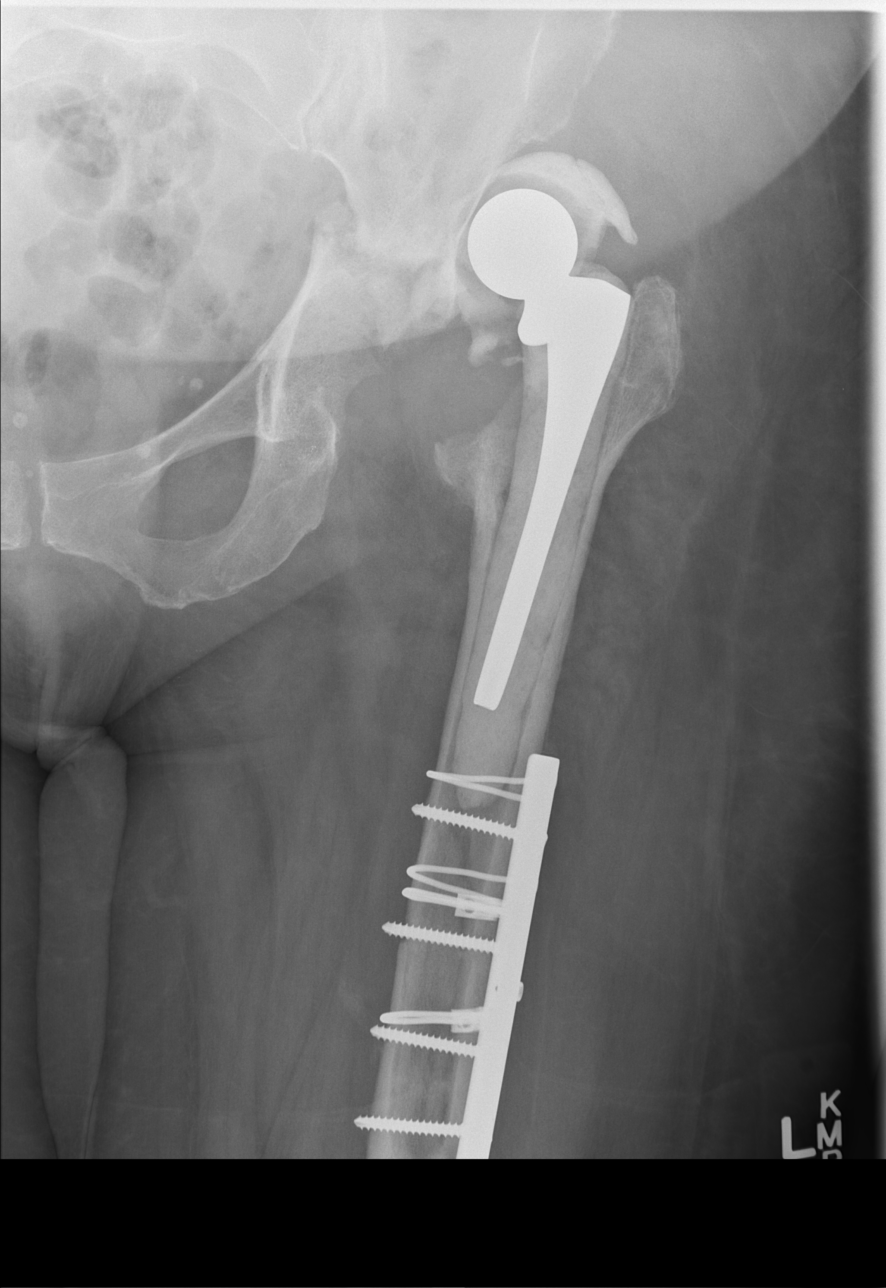

[t hip frog leg left]
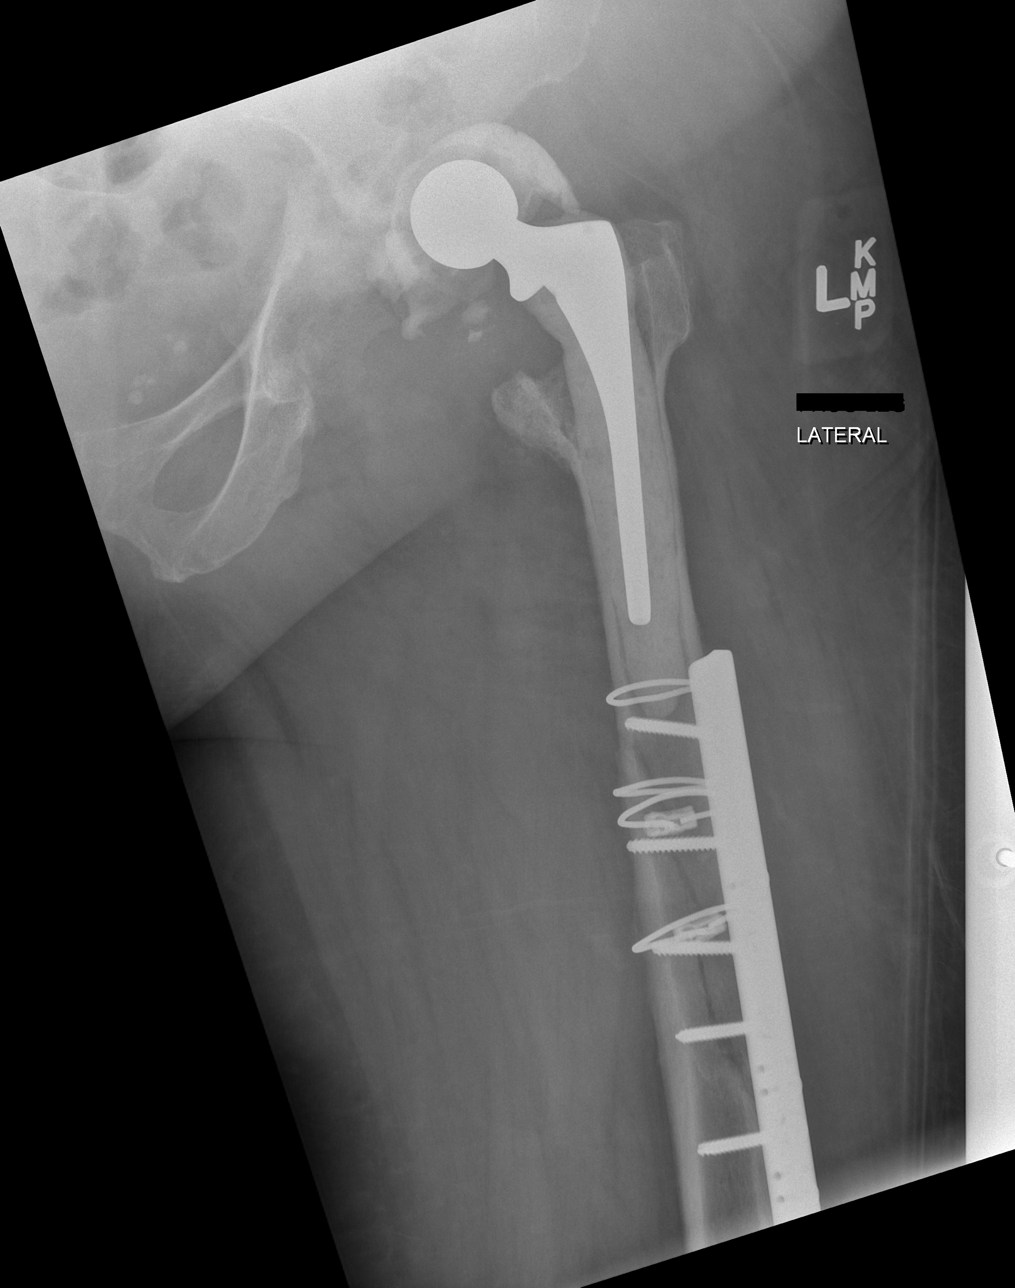

[w hip lat left (1 of 2)]
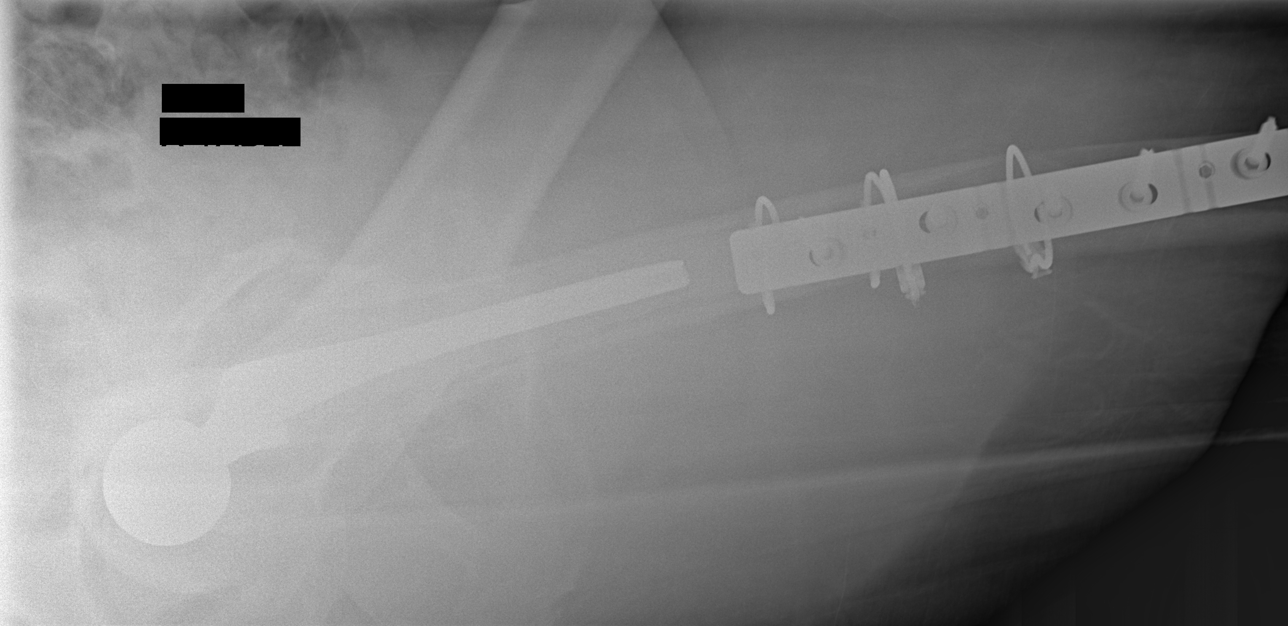

[w hip lat left (2 of 2)]
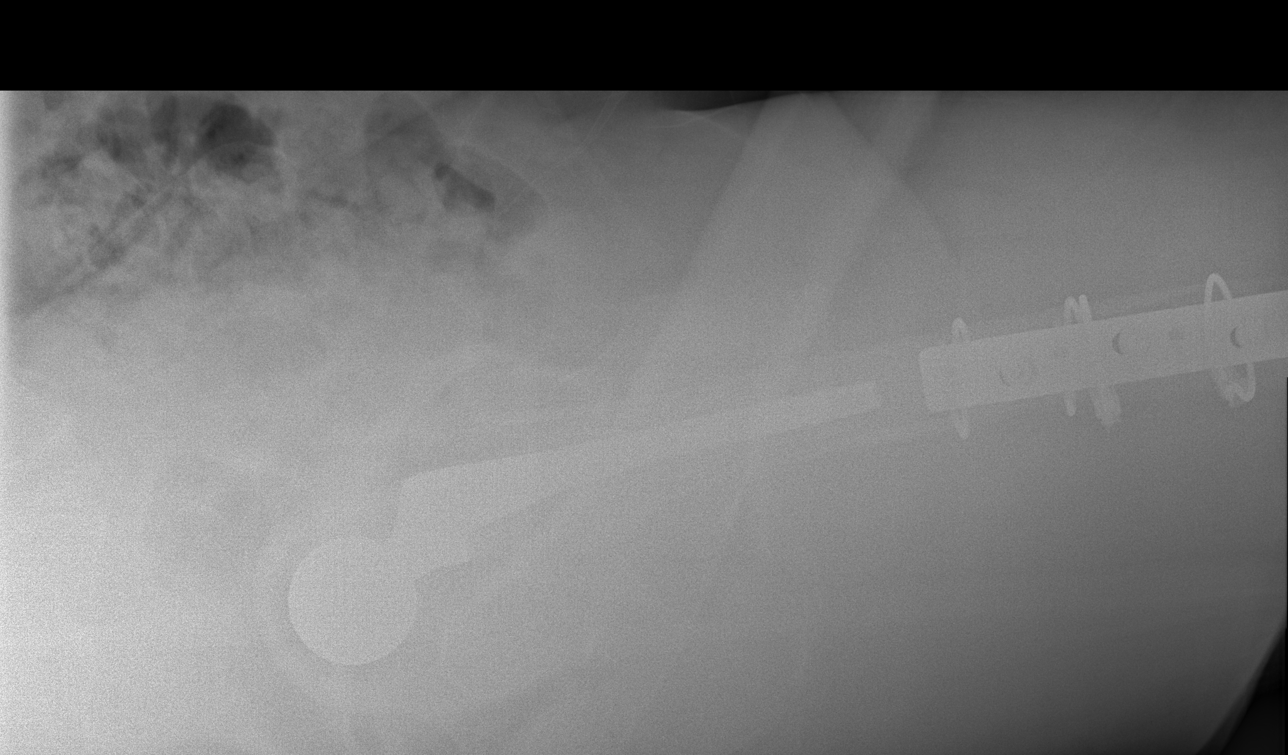

[5 of 5 positions shown; findings below may reference images not displayed]

FINDINGS: There is a left hip prosthesis with antibiotic spacer. The
prosthesis is dislocated superiorly.

Remote fracture of the left pelvis traversing the roof of the
acetabulum, which is distorted.

Remote periprosthetic fracture through the left femoral diaphysis,
with plate and cerclage wire fixation. The fracture is still
visible, but without interval displacement.

Negative right hip.
IMPRESSION: 1.  Dislocated prosthetic left hip, including antibiotic spacer.
2. Unchanged appearance of treated periprosthetic fracture in the
left femoral diaphysis.
3. Unchanged appearance of remote left acetabular fracture.

## 2015-07-04 ENCOUNTER — Ambulatory Visit: Payer: Self-pay | Admitting: Orthopedic Surgery

## 2015-07-04 NOTE — Progress Notes (Signed)
Preoperative surgical orders have been place into the Epic hospital system for [employer] on 07/04/2015, 12:00 PM  by Patrica Duel for surgery on 07-31-2015.  Preop Total Hip reimplantation orders including IV Tylenol, and IV Decadron as long as there are no contraindications to the above medications. Avel Peace, PA-C

## 2015-07-22 NOTE — Patient Instructions (Signed)
[name]  07/22/2015   Your procedure is scheduled on: 07/31/2015    Report to [facility]  Entrance take [facility]  elevators to 3rd floor to  Short Stay Center at    1250pm  Call this number if you have problems the morning of surgery [phone]   Remember: ONLY 1 PERSON MAY GO WITH YOU TO SHORT STAY TO GET  READY MORNING OF YOUR SURGERY.  Do not eat food after midnite.  May have clear liquids from 12 midnite until 0900am morning of surgery then nothing by mouth.      Take these medicines the morning of surgery with A SIP OF WATER:   Morphine if needed, Prilosec, Ditropan, Opana ER, Ocean nasal spray if needed                                 You may not have any metal on your body including hair pins and              piercings  Do not wear jewelry, , lotions, powders or perfumes, deodorant             Do not wear nail polish.  Do not shave  48 hours prior to surgery.            .   Do not bring valuables to the hospital. [facility] IS NOT             RESPONSIBLE   FOR VALUABLES.  Contacts, dentures or bridgework may not be worn into surgery.  Leave suitcase in the car. After surgery it may be brought to your room.      Special Instructions: coughing and deep breathing exercises, leg exercises               Please read over the following fact sheets you were given: _____________________________________________________________________             [facility] - Preparing for Surgery Before surgery, you can play an important role.  Because skin is not sterile, your skin needs to be as free of germs as possible.  You can reduce the number of germs on your skin by washing with CHG (chlorahexidine gluconate) soap before surgery.  CHG is an antiseptic cleaner which kills germs and bonds with the skin to continue killing germs even after washing. Please DO NOT use if you have an allergy to CHG or antibacterial soaps.  If your skin becomes  reddened/irritated stop using the CHG and inform your nurse when you arrive at Short Stay. Do not shave (including legs and underarms) for at least 48 hours prior to the first CHG shower.  You may shave your face/neck. Please follow these instructions carefully:  1.  Shower with CHG Soap the night before surgery and the  morning of Surgery.  2.  If you choose to wash your hair, wash your hair first as usual with your  normal  shampoo.  3.  After you shampoo, rinse your hair and body thoroughly to remove the  shampoo.                           4.  Use CHG as you would any other liquid soap.  You can apply chg directly  to the skin and wash  Gently with a scrungie or clean washcloth.  5.  Apply the CHG Soap to your body ONLY FROM THE NECK DOWN.   Do not use on face/ open                           Wound or open sores. Avoid contact with eyes, ears mouth and genitals (private parts).                       Wash face,  Genitals (private parts) with your normal soap.             6.  Wash thoroughly, paying special attention to the area where your surgery  will be performed.  7.  Thoroughly rinse your body with warm water from the neck down.  8.  DO NOT shower/wash with your normal soap after using and rinsing off  the CHG Soap.                9.  Pat yourself dry with a clean towel.            10.  Wear clean pajamas.            11.  Place clean sheets on your bed the night of your first shower and do not  sleep with pets. Day of Surgery : Do not apply any lotions/deodorants the morning of surgery.  Please wear clean clothes to the hospital/surgery center.  FAILURE TO FOLLOW THESE INSTRUCTIONS MAY RESULT IN THE CANCELLATION OF YOUR SURGERY PATIENT SIGNATURE_________________________________  NURSE SIGNATURE__________________________________  ________________________________________________________________________    CLEAR LIQUID DIET   Foods Allowed                                                                      Foods Excluded  Coffee and tea, regular and decaf                             liquids that you cannot  Plain Jell-O in any flavor                                             see through such as: Fruit ices (not with fruit pulp)                                     milk, soups, orange juice  Iced Popsicles                                    All solid food Carbonated beverages, regular and diet                                    Cranberry, grape and apple juices Sports drinks like Gatorade Lightly seasoned clear broth or consume(fat free) Sugar, honey syrup  Sample Menu Breakfast                                Lunch                                     Supper Cranberry juice                    Beef broth                            Chicken broth Jell-O                                     Grape juice                           Apple juice Coffee or tea                        Jell-O                                      Popsicle                                                Coffee or tea                        Coffee or tea  _____________________________________________________________________   WHAT IS A BLOOD TRANSFUSION? Blood Transfusion Information  A transfusion is the replacement of blood or some of its parts. Blood is made up of multiple cells which provide different functions.  Red blood cells carry oxygen and are used for blood loss replacement.  White blood cells fight against infection.  Platelets control bleeding.  Plasma helps clot blood.  Other blood products are available for specialized needs, such as hemophilia or other clotting disorders. BEFORE THE TRANSFUSION  Who gives blood for transfusions?   Healthy volunteers who are fully evaluated to make sure their blood is safe. This is blood bank blood. Transfusion therapy is the safest it has ever been in the practice of medicine. Before blood is taken from a donor, a complete  history is taken to make sure that person has no history of diseases nor engages in risky social behavior (examples are intravenous drug use or sexual activity with multiple partners). The donor's travel history is screened to minimize risk of transmitting infections, such as malaria. The donated blood is tested for signs of infectious diseases, such as HIV and hepatitis. The blood is then tested to be sure it is compatible with you in order to minimize the chance of a transfusion reaction. If you or a relative donates blood, this is often done in anticipation of surgery and is not appropriate for emergency situations. It takes many days to process the donated blood. RISKS AND COMPLICATIONS Although transfusion therapy is very safe and saves many lives, the main dangers of transfusion include:  1. Getting an infectious disease. 2. Developing a transfusion reaction. This is an allergic reaction to something in the blood you were given. Every precaution is taken to prevent this. The decision to have a blood transfusion has been considered carefully by your caregiver before blood is given. Blood is not given unless the benefits outweigh the risks. AFTER THE TRANSFUSION  Right after receiving a blood transfusion, you will usually feel much better and more energetic. This is especially true if your red blood cells have gotten low (anemic). The transfusion raises the level of the red blood cells which carry oxygen, and this usually causes an energy increase.  The nurse administering the transfusion will monitor you carefully for complications. HOME CARE INSTRUCTIONS  No special instructions are needed after a transfusion. You may find your energy is better. Speak with your caregiver about any limitations on activity for underlying diseases you may have. SEEK MEDICAL CARE IF:   Your condition is not improving after your transfusion.  You develop redness or irritation at the intravenous (IV) site. SEEK  IMMEDIATE MEDICAL CARE IF:  Any of the following symptoms occur over the next 12 hours:  Shaking chills.  You have a temperature by mouth above 102 F (38.9 C), not controlled by medicine.  Chest, back, or muscle pain.  People around you feel you are not acting correctly or are confused.  Shortness of breath or difficulty breathing.  Dizziness and fainting.  You get a rash or develop hives.  You have a decrease in urine output.  Your urine turns a dark color or changes to pink, red, or brown. Any of the following symptoms occur over the next 10 days:  You have a temperature by mouth above 102 F (38.9 C), not controlled by medicine.  Shortness of breath.  Weakness after normal activity.  The white part of the eye turns yellow (jaundice).  You have a decrease in the amount of urine or are urinating less often.  Your urine turns a dark color or changes to pink, red, or brown. Document Released: 10/09/2000 Document Revised: 01/04/2012 Document Reviewed: 05/28/2008 ExitCare Patient Information 2014 [place].  _______________________________________________________________________  Incentive Spirometer  An incentive spirometer is a tool that can help keep your lungs clear and active. This tool measures how well you are filling your lungs with each breath. Taking long deep breaths may help reverse or decrease the chance of developing breathing (pulmonary) problems (especially infection) following:  A long period of time when you are unable to move or be active. BEFORE THE PROCEDURE   If the spirometer includes an indicator to show your best effort, your nurse or respiratory therapist will set it to a desired goal.  If possible, sit up straight or lean slightly forward. Try not to slouch.  Hold the incentive spirometer in an upright position. INSTRUCTIONS FOR USE  3. Sit on the edge of your bed if possible, or sit up as far as you can in bed or on a  chair. 4. Hold the incentive spirometer in an upright position. 5. Breathe out normally. 6. Place the mouthpiece in your mouth and seal your lips tightly around it. 7. Breathe in slowly and as deeply as possible, raising the piston or the ball toward the top of the column. 8. Hold your breath for 3-5 seconds or for as long as possible. Allow the piston or ball to fall to the bottom of the column. 9. Remove the mouthpiece from your mouth and breathe out  normally. 10. Rest for a few seconds and repeat Steps 1 through 7 at least 10 times every 1-2 hours when you are awake. Take your time and take a few normal breaths between deep breaths. 11. The spirometer may include an indicator to show your best effort. Use the indicator as a goal to work toward during each repetition. 12. After each set of 10 deep breaths, practice coughing to be sure your lungs are clear. If you have an incision (the cut made at the time of surgery), support your incision when coughing by placing a pillow or rolled up towels firmly against it. Once you are able to get out of bed, walk around indoors and cough well. You may stop using the incentive spirometer when instructed by your caregiver.  RISKS AND COMPLICATIONS  Take your time so you do not get dizzy or light-headed.  If you are in pain, you may need to take or ask for pain medication before doing incentive spirometry. It is harder to take a deep breath if you are having pain. AFTER USE  Rest and breathe slowly and easily.  It can be helpful to keep track of a log of your progress. Your caregiver can provide you with a simple table to help with this. If you are using the spirometer at home, follow these instructions: [place] IF:   You are having difficultly using the spirometer.  You have trouble using the spirometer as often as instructed.  Your pain medication is not giving enough relief while using the spirometer.  You develop fever of 100.5 F  (38.1 C) or higher. SEEK IMMEDIATE MEDICAL CARE IF:   You cough up bloody sputum that had not been present before.  You develop fever of 102 F (38.9 C) or greater.  You develop worsening pain at or near the incision site. MAKE SURE YOU:   Understand these instructions.  Will watch your condition.  Will get help right away if you are not doing well or get worse. Document Released: 02/22/2007 Document Revised: 01/04/2012 Document Reviewed: 04/25/2007 [facility] Patient Information 2014 [place], [place].   ________________________________________________________________________

## 2015-07-24 ENCOUNTER — Other Ambulatory Visit (HOSPITAL_COMMUNITY): Payer: Medicare Other

## 2015-07-25 ENCOUNTER — Inpatient Hospital Stay (HOSPITAL_COMMUNITY)
Admission: RE | Admit: 2015-07-25 | Discharge: 2015-07-25 | Disposition: A | Discharge status: Home / Self Care | Payer: Medicare Other | Source: Ambulatory Visit

## 2015-08-23 ENCOUNTER — Ambulatory Visit: Payer: Self-pay | Admitting: Orthopedic Surgery

## 2015-08-23 NOTE — Progress Notes (Signed)
Preoperative surgical orders have been place into the Epic hospital system for [employer] on 08/23/2015, 4:46 PM  by Patrica DuelPERKINS, Antoine Vandermeulen for surgery on 09-13-2015.  Preop Total Hip orders including Experel Injecion, IV Tylenol, and IV Decadron as long as there are no contraindications to the above medications. Avel Peacerew Reynard Christoffersen, PA-C

## 2015-09-05 ENCOUNTER — Encounter (HOSPITAL_COMMUNITY)
Admission: RE | Admit: 2015-09-05 | Discharge: 2015-09-05 | Disposition: A | Discharge status: Home / Self Care | Payer: Medicare Other | Source: Ambulatory Visit | Attending: Orthopedic Surgery | Admitting: Orthopedic Surgery

## 2015-09-05 ENCOUNTER — Encounter (HOSPITAL_COMMUNITY): Payer: Self-pay

## 2015-09-05 DIAGNOSIS — T84018D Broken internal joint prosthesis, other site, subsequent encounter: Secondary | ICD-10-CM | POA: Diagnosis not present

## 2015-09-05 DIAGNOSIS — Z96649 Presence of unspecified artificial hip joint: Secondary | ICD-10-CM | POA: Diagnosis not present

## 2015-09-05 DIAGNOSIS — Z01818 Encounter for other preprocedural examination: Secondary | ICD-10-CM | POA: Insufficient documentation

## 2015-09-05 HISTORY — DX: Personal history of other medical treatment: Z92.89

## 2015-09-05 LAB — PROTIME-INR
INR: 1.07 (ref 0.00–1.49)
Prothrombin Time: 14.1 seconds (ref 11.6–15.2)

## 2015-09-05 LAB — URINE MICROSCOPIC-ADD ON

## 2015-09-05 LAB — SURGICAL PCR SCREEN
MRSA, PCR: NEGATIVE
Staphylococcus aureus: NEGATIVE

## 2015-09-05 LAB — URINALYSIS, ROUTINE W REFLEX MICROSCOPIC
Bilirubin Urine: NEGATIVE
GLUCOSE, UA: NEGATIVE mg/dL
HGB URINE DIPSTICK: NEGATIVE
Ketones, ur: NEGATIVE mg/dL
Nitrite: NEGATIVE
PROTEIN: NEGATIVE mg/dL
SPECIFIC GRAVITY, URINE: 1.011 (ref 1.005–1.030)
Urobilinogen, UA: 0.2 mg/dL (ref 0.0–1.0)
pH: 5.5 (ref 5.0–8.0)

## 2015-09-05 LAB — COMPREHENSIVE METABOLIC PANEL
ALBUMIN: 4 g/dL (ref 3.5–5.0)
ALK PHOS: 93 U/L (ref 38–126)
ALT: 17 U/L (ref 14–54)
ANION GAP: 6 (ref 5–15)
AST: 26 U/L (ref 15–41)
BUN: 11 mg/dL (ref 6–20)
CHLORIDE: 103 mmol/L (ref 101–111)
CO2: 29 mmol/L (ref 22–32)
Calcium: 9.6 mg/dL (ref 8.9–10.3)
Creatinine, Ser: 0.66 mg/dL (ref 0.44–1.00)
GFR calc non Af Amer: >60 mL/min (ref >60)
GLUCOSE: 119 mg/dL — AB (ref 65–99)
POTASSIUM: 4.2 mmol/L (ref 3.5–5.1)
SODIUM: 138 mmol/L (ref 135–145)
Total Bilirubin: 0.8 mg/dL (ref 0.3–1.2)
Total Protein: 7.3 g/dL (ref 6.5–8.1)

## 2015-09-05 LAB — TYPE AND SCREEN
ABO/RH(D): O POS
ANTIBODY SCREEN: POSITIVE
DAT, IGG: NEGATIVE

## 2015-09-05 LAB — APTT: APTT: 30 s (ref 24–37)

## 2015-09-05 NOTE — Patient Instructions (Addendum)
20 [name]  09/05/2015   Your procedure is scheduled on:   09-13-2015 Friday  [facility] and [facility] to 3rd Floor- Short Stay Center. Arrive at     1030   AM..  (Limit 1 person with you).  Call this number if you have problems the morning of surgery: [phone]  Or Presurgical Testing [phone] days before.   For Living Will and/or Health Care Power Attorney Forms: please provide copy for your medical record,may bring AM of surgery(Forms should be already notarized -we do not provide this service).(09-05-15 Yes,information provided with previous admission 10-17-14).   For Cpap use: Bring mask and tubing only.   Do not eat food/ or drink: After Midnight.  Exception: may have clear liquids:up to 6 Hours before arrival. Nothing after: 0700 AM  Clear liquids include soda, tea, black coffee, apple or grape juice, broth.  Take these medicines the morning of surgery with A SIP OF WATER-   (DO NOT TAKE ANY DIABETIC MEDS AM OF SURGERY) : Gabapentin. Morphine. Omeprazole. Opana. Use/ Bring Inhalers. Nasal Spray as needed- if not Using Mupirocin ointment.   Do not wear jewelry, make-up or nail polish.  Do not wear deodorant, lotions, powders, or perfumes.   Do not shave legs and under arms- 48 hours(2 days) prior to first CHG shower.(Shaving face and neck okay.)  Do not bring valuables to the hospital.(Hospital is not responsible for lost valuables).  Contacts, dentures or removable bridgework, body piercing, hair pins may not be worn into surgery.  Leave suitcase in the car. After surgery it may be brought to your room.  For patients admitted to the hospital, checkout time is 11:00 AM the day of discharge.(Restricted visitors-Any Persons displaying flu-like symptoms or illness).    Patients discharged the day of surgery will not be allowed to drive home. Must have responsible person with you x 24 hours once discharged.  Name and  phone number of your driver: Sister Hoy RegisterJulia Graham-  [phone]     Please read over the following fact sheets that you were given:  CHG(Chlorhexidine Gluconate 4% Surgical Soap) use, MRSA Information, Blood Transfusion fact sheet, Incentive Spirometry Instruction.  Remember : Type/Screen "Blue armbands" - may not be removed once applied(would result in being retested AM of surgery, if removed).         [name] - Preparing for Surgery Before surgery, you can play an important role.  Because skin is not sterile, your skin needs to be as free of germs as possible.  You can reduce the number of germs on your skin by washing with CHG (chlorahexidine gluconate) soap before surgery.  CHG is an antiseptic cleaner which kills germs and bonds with the skin to continue killing germs even after washing. Please DO NOT use if you have an allergy to CHG or antibacterial soaps.  If your skin becomes reddened/irritated stop using the CHG and inform your nurse when you arrive at Short Stay. Do not shave (including legs and underarms) for at least 48 hours prior to the first CHG shower.  You may shave your face/neck. Please follow these instructions carefully:  1.  Shower with CHG Soap the night before surgery and the  morning of Surgery.  2.  If you choose to wash your hair, wash your hair first as usual with your  normal  shampoo.  3.  After you shampoo, rinse your hair and body thoroughly to remove the  shampoo.  4.  Use CHG as you would any other liquid soap.  You can apply chg directly  to the skin and wash                       Gently with a scrungie or clean washcloth.  5.  Apply the CHG Soap to your body ONLY FROM THE NECK DOWN.   Do not use on face/ open                           Wound or open sores. Avoid contact with eyes, ears mouth and genitals (private parts).                       Wash face,  Genitals (private parts) with your normal soap.             6.  Wash  thoroughly, paying special attention to the area where your surgery  will be performed.  7.  Thoroughly rinse your body with warm water from the neck down.  8.  DO NOT shower/wash with your normal soap after using and rinsing off  the CHG Soap.                9.  Pat yourself dry with a clean towel.            10.  Wear clean pajamas.            11.  Place clean sheets on your bed the night of your first shower and do not  sleep with pets. Day of Surgery : Do not apply any lotions/deodorants the morning of surgery.  Please wear clean clothes to the hospital/surgery center.  FAILURE TO FOLLOW THESE INSTRUCTIONS MAY RESULT IN THE CANCELLATION OF YOUR SURGERY PATIENT SIGNATURE_________________________________  NURSE SIGNATURE__________________________________  ________________________________________________________________________   [name]  An incentive spirometer is a tool that can help keep your lungs clear and active. This tool measures how well you are filling your lungs with each breath. Taking long deep breaths may help reverse or decrease the chance of developing breathing (pulmonary) problems (especially infection) following:  A long period of time when you are unable to move or be active. BEFORE THE PROCEDURE   If the spirometer includes an indicator to show your best effort, your nurse or respiratory therapist will set it to a desired goal.  If possible, sit up straight or lean slightly forward. Try not to slouch.  Hold the incentive spirometer in an upright position. INSTRUCTIONS FOR USE   Sit on the edge of your bed if possible, or sit up as far as you can in bed or on a chair.  Hold the incentive spirometer in an upright position.  Breathe out normally.  Place the mouthpiece in your mouth and seal your lips tightly around it.  Breathe in slowly and as deeply as possible, raising the piston or the ball toward the top of the column.  Hold your breath  for 3-5 seconds or for as long as possible. Allow the piston or ball to fall to the bottom of the column.  Remove the mouthpiece from your mouth and breathe out normally.  Rest for a few seconds and repeat Steps 1 through 7 at least 10 times every 1-2 hours when you are awake. Take your time and take a few normal breaths between deep breaths.  The spirometer may include an indicator to  show your best effort. Use the indicator as a goal to work toward during each repetition.  After each set of 10 deep breaths, practice coughing to be sure your lungs are clear. If you have an incision (the cut made at the time of surgery), support your incision when coughing by placing a pillow or rolled up towels firmly against it. Once you are able to get out of bed, walk around indoors and cough well. You may stop using the incentive spirometer when instructed by your caregiver.  RISKS AND COMPLICATIONS  Take your time so you do not get dizzy or light-headed.  If you are in pain, you may need to take or ask for pain medication before doing incentive spirometry. It is harder to take a deep breath if you are having pain. AFTER USE  Rest and breathe slowly and easily.  It can be helpful to keep track of a log of your progress. Your caregiver can provide you with a simple table to help with this. If you are using the spirometer at home, follow these instructions: SEEK MEDICAL CARE IF:   You are having difficultly using the spirometer.  You have trouble using the spirometer as often as instructed.  Your pain medication is not giving enough relief while using the spirometer.  You develop fever of 100.5 F (38.1 C) or higher. SEEK IMMEDIATE MEDICAL CARE IF:   You cough up bloody sputum that had not been present before.  You develop fever of 102 F (38.9 C) or greater.  You develop worsening pain at or near the incision site. MAKE SURE YOU:   Understand these instructions.  Will watch your  condition.  Will get help right away if you are not doing well or get worse. Document Released: 02/22/2007 Document Revised: 01/04/2012 Document Reviewed: 04/25/2007 Exit[employer] Patient Information 2014 Exit[employer], [place].   ________________________________________________________________________  WHAT IS A BLOOD TRANSFUSION? Blood Transfusion Information  A transfusion is the replacement of blood or some of its parts. Blood is made up of multiple cells which provide different functions.  Red blood cells carry oxygen and are used for blood loss replacement.  White blood cells fight against infection.  Platelets control bleeding.  Plasma helps clot blood.  Other blood products are available for specialized needs, such as hemophilia or other clotting disorders. BEFORE THE TRANSFUSION  Who gives blood for transfusions?   Healthy volunteers who are fully evaluated to make sure their blood is safe. This is blood bank blood. Transfusion therapy is the safest it has ever been in the practice of medicine. Before blood is taken from a donor, a complete history is taken to make sure that person has no history of diseases nor engages in risky social behavior (examples are intravenous drug use or sexual activity with multiple partners). The donor's travel history is screened to minimize risk of transmitting infections, such as malaria. The donated blood is tested for signs of infectious diseases, such as HIV and hepatitis. The blood is then tested to be sure it is compatible with you in order to minimize the chance of a transfusion reaction. If you or a relative donates blood, this is often done in anticipation of surgery and is not appropriate for emergency situations. It takes many days to process the donated blood. RISKS AND COMPLICATIONS Although transfusion therapy is very safe and saves many lives, the main dangers of transfusion include:   Getting an infectious disease.  Developing a transfusion  reaction. This is an allergic reaction  to something in the blood you were given. Every precaution is taken to prevent this. The decision to have a blood transfusion has been considered carefully by your caregiver before blood is given. Blood is not given unless the benefits outweigh the risks. AFTER THE TRANSFUSION  Right after receiving a blood transfusion, you will usually feel much better and more energetic. This is especially true if your red blood cells have gotten low (anemic). The transfusion raises the level of the red blood cells which carry oxygen, and this usually causes an energy increase.  The nurse administering the transfusion will monitor you carefully for complications. HOME CARE INSTRUCTIONS  No special instructions are needed after a transfusion. You may find your energy is better. Speak with your caregiver about any limitations on activity for underlying diseases you may have. SEEK MEDICAL CARE IF:   Your condition is not improving after your transfusion.  You develop redness or irritation at the intravenous (IV) site. SEEK IMMEDIATE MEDICAL CARE IF:  Any of the following symptoms occur over the next 12 hours:  Shaking chills.  You have a temperature by mouth above 102 F (38.9 C), not controlled by medicine.  Chest, back, or muscle pain.  People around you feel you are not acting correctly or are confused.  Shortness of breath or difficulty breathing.  Dizziness and fainting.  You get a rash or develop hives.  You have a decrease in urine output.  Your urine turns a dark color or changes to pink, red, or brown. Any of the following symptoms occur over the next 10 days:  You have a temperature by mouth above 102 F (38.9 C), not controlled by medicine.  Shortness of breath.  Weakness after normal activity.  The white part of the eye turns yellow (jaundice).  You have a decrease in the amount of urine or are urinating less often.  Your urine turns a  dark color or changes to pink, red, or brown. Document Released: 10/09/2000 Document Revised: 01/04/2012 Document Reviewed: 05/28/2008 [facility] Patient Information 2014 [place], [place].  _______________________________________________________________________

## 2015-09-05 NOTE — Pre-Procedure Instructions (Addendum)
EKG 06-13-15 -Dr. Steele BergHinson with chart. CBC/d 08-23-15 with chart. 09-06-15 1210 Note fax per Epic- to note urinalysis.

## 2015-09-06 ENCOUNTER — Encounter (HOSPITAL_COMMUNITY): Payer: Self-pay

## 2015-09-06 NOTE — Progress Notes (Signed)
09-06-15 1210 Labs viewable in Epic. Note Urinalysis result. Faxed note to [phone].

## 2015-09-10 NOTE — H&P (Signed)
TOTAL HIP REVISION ADMISSION H&P  Patient is admitted for left revision total hip arthroplasty (reimplantation)  Subjective:  Chief Complaint: left hip pain  HPI: [name], 59 y.o. female, has a history of pain and functional disability in the left hip due to arthritis and patient has failed non-surgical conservative treatments for greater than 12 weeks to include NSAID's and/or analgesics, use of assistive devices and activity modification. The indications for the revision total hip arthroplasty are previous infection requiring removal of prosthesis, I&D, and placement of cement spacer.  Onset of symptoms was gradual starting 2 years ago with gradually worsening course since that time.  Prior procedures on the left hip include arthroplasty and I&D.  Patient currently rates pain in the left hip at 8 out of 10 with activity.  There is night pain, worsening of pain with activity and weight bearing, pain that interfers with activities of daily living and pain with passive range of motion. This condition presents safety issues increasing the risk of falls.  There is no current active infection.  Patient Active Problem List   Diagnosis Date Noted  . 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 Hip dislocation, left (HCC) 10/12/2014  . Left hip pain 10/12/2014  . Pancytopenia (HCC) 10/12/2014  . Depression   . GERD (gastroesophageal reflux disease)   . OA (osteoarthritis)   . Gastroesophageal reflux disease with esophagitis   . OSA on CPAP   . Septic arthritis of hip (HCC) 08/24/2014   Past Medical History  Diagnosis Date  . Tenosynovitis of fingers     RIGHT RING AND SMALL-HAD SURGERY 07/12/14 [facility]  . Bronchitis     dx  07-04-2014 (approx) will finished antibiotic 07-14-2014  . History of concussion     x4  --  no residual  . Migraines   . Depression   . Inability to ambulate due to hip     hx  left total hip infection and removal arthroplasty  /  uses w/c or crutches  . GERD (gastroesophageal reflux disease)   . H/O hiatal hernia   . Chronic constipation   . Chronic narcotic use   . Nocturia   . SUI (stress urinary incontinence, female)   . OA (osteoarthritis)     hips, knees, hips  . 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 Full dentures   . History of cellulitis     lower extremities  . Thinning of skin     easily tears  . Venous stasis dermatitis     BILATERAL LEGS  . Productive cough     RESOLVED - QUIT SMOKING  . Memory problem   . Transfusion history   . OSA on CPAP     study x3   last one few yrs ago  cpap recommended -- DOES NOT USE OR HAVE CPAP.09-05-15 pt states"being evaluated-no cpap machine yet".    Past Surgical History  Procedure Laterality Date  . Appendectomy  10 (age 51)  . 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 Orif left foot fx  1990's  . Orif right tibial fx  2008    hardware removed same year  . Open repair and fixation right tibia and knee  X2  2009  . Total knee arthroplasty  Right X2  2009  &  2010  . Total knee arthroplasty Left 2009  . Knee arthroscopy Left 2009  . Orif pelvic fx's  11 /2011  . Total hip arthroplasty Left 2012  . Revision total hip arthroplasty Left 01/ 2015  . Removal prothesis  left hip arthroplasty  02/ 2015    INFECTION  . Laparoscopic cholecystectomy  08-12-2003  . Tenosynovectomy Right 07/12/2014    Procedure: RIGHT RING/SMALL FINGER TENOSYNOVECTOMY;  Surgeon: Sharma Covert, MD;  Location: [facility];  Service: Orthopedics;  Laterality: Right;  . Total hip revision Left 08/24/2014    Procedure: INCISION AND DRAINAGE LEFT HIP, EXCHANGE ANTIBIOTIC SPACER, OPEN REDUCTION LEFT FEMUR FRACTURE ;  Surgeon: Loanne Drilling, MD;  Location: [facility];  Service: Orthopedics;  Laterality: Left;  .  Total hip revision Left 10/14/2014    Procedure: PARTIAL HIP REVISION;  Surgeon: Shelda Pal, MD;  Location: [facility];  Service: Orthopedics;  Laterality: Left;  . Reimplantation of cement spacer hip Left 11/17/2014    Procedure: IRRIGATION AND DEBRIDEMENT AND REIMPLANTATION OF CEMENT SPACER HIP;  Surgeon: Loanne Drilling, MD;  Location: [facility];  Service: Orthopedics;  Laterality: Left;      Current outpatient prescriptions:  .  Ascorbic Acid (VITAMIN C) 100 MG tablet, Take 100 mg by mouth 3 (three) times a week. , Disp: , Rfl:  .  b complex vitamins tablet, Take 1 tablet by mouth every morning. , Disp: , Rfl:  .  Calcium-Magnesium-Vitamin D (CALCIUM MAGNESIUM PO), Take 1 tablet by mouth every morning., Disp: , Rfl:  .  Cholecalciferol (VITAMIN D-3 PO), Take 1 tablet by mouth 2 (two) times daily., Disp: , Rfl:  .  clotrimazole-betamethasone (LOTRISONE) cream, Apply 1 application topically 2 (two) times daily as needed (rash in skin folds)., Disp: , Rfl:  .  ferrous sulfate 325 (65 FE) MG EC tablet, Take 325 mg by mouth 3 (three) times a week. , Disp: , Rfl:  .  furosemide (LASIX) 40 MG tablet, Take 40 mg by mouth 2 (two) times daily as needed (fluid retention)., Disp: , Rfl:  .  gabapentin (NEURONTIN) 400 MG capsule, Take 2 capsules (800 mg total) by mouth 2 (two) times daily. (Patient taking differently: Take 400-800 mg by mouth 2 (two) times daily. Pt takes 400 mg BID and 800 mg BID.= 2400 mg a day.), Disp: 60 capsule, Rfl: 0 .  hydrOXYzine (ATARAX/VISTARIL) 25 MG tablet, Take 50 mg by mouth every 6 (six) hours as needed for anxiety (anxiety). , Disp: , Rfl:  .  ibuprofen (ADVIL,MOTRIN) 200 MG tablet, Take 600-800 mg by mouth every 6 (six) hours as needed for moderate pain., Disp: , Rfl:  .  meloxicam (MOBIC) 7.5 MG tablet, Take 7.5 mg by mouth 2 (two) times daily., Disp: , Rfl:  .  methocarbamol (ROBAXIN) 750 MG tablet, Take 750 mg by mouth 2 (two) times daily as needed for muscle spasms.,  Disp: , Rfl:  .  morphine (MSIR) 15 MG tablet, Take 15 mg by mouth every 8 (eight) hours as needed for moderate pain or severe pain., Disp: , Rfl:  .  Multiple Vitamin (MULTIVITAMIN) tablet, Take 1 tablet by mouth every evening. , Disp: , Rfl:  .  Naloxegol Oxalate (MOVANTIK PO), Take 25 mg by mouth at bedtime. , Disp: , Rfl:  .  Omega-3 Fatty Acids (FISH OIL) 1000 MG CAPS, Take 1 capsule by mouth daily., Disp: , Rfl:  .  omeprazole (  PRILOSEC) 40 MG capsule, Take 40 mg by mouth daily., Disp: , Rfl:  .  ondansetron (ZOFRAN) 4 MG tablet, Take 1 tablet (4 mg total) by mouth every 6 (six) hours as needed for nausea., Disp: 40 tablet, Rfl: 0 .  oxybutynin (DITROPAN) 5 MG tablet, Take 5 mg by mouth 3 (three) times daily., Disp: , Rfl:  .  oxymorphone (OPANA ER) 10 MG 12 hr tablet, Take 10 mg by mouth every 12 (twelve) hours., Disp: , Rfl:  .  POTASSIUM PO, Take 1 tablet by mouth at bedtime., Disp: , Rfl:  .  promethazine (PHENERGAN) 12.5 MG tablet, Take 1 tablet (12.5 mg total) by mouth every 6 (six) hours as needed for nausea or vomiting., Disp: 30 tablet, Rfl: 3 .  sodium chloride (OCEAN) 0.65 % SOLN nasal spray, Place 2 sprays into both nostrils daily as needed for congestion (dryness). , Disp: , Rfl:  .  SUMAtriptan (IMITREX) 100 MG tablet, Take 100 mg by mouth once. May repeat in 2 hours if headache persists or recurs. NO MORE THAN 2 IN A 24 HOUR PERIOD, Disp: , Rfl:  .  tiZANidine (ZANAFLEX) 4 MG tablet, Take 4 mg by mouth 3 (three) times daily as needed for muscle spasms., Disp: , Rfl:  .  traZODone (DESYREL) 150 MG tablet, Take 150-300 mg by mouth at bedtime as needed for sleep. , Disp: , Rfl:  .  triamcinolone cream (KENALOG) 0.1 %, Apply 1 application topically 2 (two) times daily as needed (irritation). CALF OF BOTH LEGS - BECAUSE OF SKIN IRRITATION THOUGHT TO BE DUE TO SWELLING OF LEGS, Disp: , Rfl:  .  budesonide-formoterol (SYMBICORT) 160-4.5 MCG/ACT inhaler, Inhale 1-2 puffs into the lungs 2  (two) times daily., Disp: , Rfl:    Allergies  Allergen Reactions  . Tomato Nausea And Vomiting    Other reaction(s): Other (See Comments) GI Upset  . Vancomycin Other (See Comments)    LEUKOPENIA    Social History  Substance Use Topics  . Smoking status: Former Smoker -- 1.00 packs/day for 30 years    Types: Cigarettes    Quit date: 07/09/2014  . Smokeless tobacco: Never Used     Comment: PT STATES ATTEMPTING TO QUIT SMOKING 07-09-2014--  USING NICOTIN LOZENGERS  . Alcohol Use: No    Family History  Problem Relation Age of Onset  . COPD Mother   . Hypertension Father   . Pancreatic cancer Father   . COPD Brother       Review of Systems  Constitutional: Positive for chills and malaise/fatigue. Negative for fever, weight loss and diaphoresis.  HENT: Negative for congestion, ear discharge, ear pain, hearing loss, nosebleeds, sore throat and tinnitus.   Eyes: Negative.   Respiratory: Positive for shortness of breath. Negative for cough, hemoptysis, sputum production, wheezing and stridor.   Cardiovascular: Negative.   Gastrointestinal: Negative.   Genitourinary: Positive for frequency. Negative for dysuria, urgency, hematuria and flank pain.  Musculoskeletal: Positive for myalgias, back pain and joint pain. Negative for falls and neck pain.  Skin: Negative.   Neurological: Positive for weakness and headaches. Negative for dizziness, tingling, tremors, sensory change, speech change, focal weakness, seizures and loss of consciousness.  Endo/Heme/Allergies: Negative for environmental allergies and polydipsia. Bruises/bleeds easily.  Psychiatric/Behavioral: Positive for depression. Negative for suicidal ideas, hallucinations, memory loss and substance abuse. The patient is nervous/anxious. The patient does not have insomnia.     Objective:  Physical Exam  Constitutional: She is oriented to person, place,  and time. She appears well-developed. No distress.  Obese  HENT:   Head: Normocephalic and atraumatic.  Right Ear: External ear normal.  Left Ear: External ear normal.  Nose: Nose normal.  Mouth/Throat: Oropharynx is clear and moist.  Eyes: Conjunctivae and EOM are normal.  Neck: Normal range of motion. Neck supple.  Cardiovascular: Normal rate, regular rhythm, normal heart sounds and intact distal pulses.   No murmur heard. Respiratory: Effort normal and breath sounds normal. No respiratory distress. She has no wheezes.  GI: Soft. Bowel sounds are normal. She exhibits no distension. There is no tenderness.  Musculoskeletal:       Right hip: Normal.       Left hip: She exhibits decreased range of motion and decreased strength.       Right knee: Normal.       Left knee: Normal.  Neurological: She is alert and oriented to person, place, and time. She has normal strength and normal reflexes. No sensory deficit.  Skin: No rash noted. She is not diaphoretic. No erythema.  Psychiatric: She has a normal mood and affect. Her behavior is normal.     Vitals  Weight: 230 lb Height: 70in Body Surface Area: 2.22 m Body Mass Index: 33 kg/m  Pulse: 76 (Regular)  BP: 130/80 (Sitting, Left Arm, Standard)  Imaging Review:  Plain radiographs demonstrate severe degenerative joint disease of the left hip(s). The bone quality appears to be adequate for age and reported activity level.   Assessment/Plan:  End stage primary osteoarthritis, left hip(s) with failed previous arthroplasty.  The patient history, physical examination, clinical judgement of the provider and imaging studies are consistent with end stage degenerative joint disease of the left hip(s), previous total hip arthroplasty. Revision total hip arthroplasty is deemed medically necessary. The treatment options including medical management, injection therapy, arthroscopy and arthroplasty were discussed at length. The risks and benefits of total hip arthroplasty were presented and reviewed. The  risks due to aseptic loosening, infection, stiffness, dislocation/subluxation,  thromboembolic complications and other imponderables were discussed.  The patient acknowledged the explanation, agreed to proceed with the plan and consent was signed. Patient is being admitted for inpatient treatment for surgery, pain control, PT, OT, prophylactic antibiotics, VTE prophylaxis, progressive ambulation and ADL's and discharge planning. The patient is planning to be discharged to skilled nursing facility   [facility]   Mar[place]ottmber Suraya Vidrine, [place]

## 2015-09-13 ENCOUNTER — Inpatient Hospital Stay (HOSPITAL_COMMUNITY): Payer: Medicare Other

## 2015-09-13 ENCOUNTER — Inpatient Hospital Stay (HOSPITAL_COMMUNITY): Payer: Medicare Other | Admitting: Anesthesiology

## 2015-09-13 ENCOUNTER — Inpatient Hospital Stay (HOSPITAL_COMMUNITY)
Admission: RE | Admit: 2015-09-13 | Discharge: 2015-09-16 | DRG: 468 | Disposition: A | Discharge status: Skilled Nursing Facility | Payer: Medicare Other | Source: Ambulatory Visit | Attending: Orthopedic Surgery | Admitting: Orthopedic Surgery

## 2015-09-13 ENCOUNTER — Encounter (HOSPITAL_COMMUNITY)
Admission: RE | Disposition: A | Discharge status: Skilled Nursing Facility | Payer: Self-pay | Source: Ambulatory Visit | Attending: Orthopedic Surgery

## 2015-09-13 ENCOUNTER — Encounter (HOSPITAL_COMMUNITY): Payer: Self-pay | Admitting: *Deleted

## 2015-09-13 DIAGNOSIS — T8452XA Infection and inflammatory reaction due to internal left hip prosthesis, initial encounter: Secondary | ICD-10-CM | POA: Diagnosis present

## 2015-09-13 DIAGNOSIS — Y831 Surgical operation with implant of artificial internal device as the cause of abnormal reaction of the patient, or of later complication, without mention of misadventure at the time of the procedure: Secondary | ICD-10-CM | POA: Diagnosis present

## 2015-09-13 DIAGNOSIS — Z96653 Presence of artificial knee joint, bilateral: Secondary | ICD-10-CM | POA: Diagnosis present

## 2015-09-13 DIAGNOSIS — Z881 Allergy status to other antibiotic agents status: Secondary | ICD-10-CM

## 2015-09-13 DIAGNOSIS — Z800 Family history of malignant neoplasm of digestive organs: Secondary | ICD-10-CM | POA: Diagnosis not present

## 2015-09-13 DIAGNOSIS — M1612 Unilateral primary osteoarthritis, left hip: Secondary | ICD-10-CM | POA: Diagnosis present

## 2015-09-13 DIAGNOSIS — Z01812 Encounter for preprocedural laboratory examination: Secondary | ICD-10-CM

## 2015-09-13 DIAGNOSIS — G4733 Obstructive sleep apnea (adult) (pediatric): Secondary | ICD-10-CM | POA: Diagnosis present

## 2015-09-13 DIAGNOSIS — Z888 Allergy status to other drugs, medicaments and biological substances status: Secondary | ICD-10-CM | POA: Diagnosis not present

## 2015-09-13 DIAGNOSIS — Z791 Long term (current) use of non-steroidal anti-inflammatories (NSAID): Secondary | ICD-10-CM

## 2015-09-13 DIAGNOSIS — F329 Major depressive disorder, single episode, unspecified: Secondary | ICD-10-CM | POA: Diagnosis present

## 2015-09-13 DIAGNOSIS — E669 Obesity, unspecified: Secondary | ICD-10-CM | POA: Diagnosis present

## 2015-09-13 DIAGNOSIS — K219 Gastro-esophageal reflux disease without esophagitis: Secondary | ICD-10-CM | POA: Diagnosis present

## 2015-09-13 DIAGNOSIS — Z6832 Body mass index (BMI) 32.0-32.9, adult: Secondary | ICD-10-CM | POA: Diagnosis not present

## 2015-09-13 DIAGNOSIS — Z96649 Presence of unspecified artificial hip joint: Secondary | ICD-10-CM

## 2015-09-13 DIAGNOSIS — Z79891 Long term (current) use of opiate analgesic: Secondary | ICD-10-CM

## 2015-09-13 DIAGNOSIS — Z79899 Other long term (current) drug therapy: Secondary | ICD-10-CM

## 2015-09-13 DIAGNOSIS — Z87891 Personal history of nicotine dependence: Secondary | ICD-10-CM

## 2015-09-13 DIAGNOSIS — Z8249 Family history of ischemic heart disease and other diseases of the circulatory system: Secondary | ICD-10-CM

## 2015-09-13 DIAGNOSIS — Z825 Family history of asthma and other chronic lower respiratory diseases: Secondary | ICD-10-CM | POA: Diagnosis not present

## 2015-09-13 DIAGNOSIS — T84018A Broken internal joint prosthesis, other site, initial encounter: Secondary | ICD-10-CM

## 2015-09-13 DIAGNOSIS — M25552 Pain in left hip: Secondary | ICD-10-CM | POA: Diagnosis present

## 2015-09-13 DIAGNOSIS — I872 Venous insufficiency (chronic) (peripheral): Secondary | ICD-10-CM | POA: Diagnosis present

## 2015-09-13 HISTORY — PX: REIMPLANTATION OF TOTAL HIP: SHX6051

## 2015-09-13 LAB — PREPARE RBC (CROSSMATCH)

## 2015-09-13 SURGERY — REIMPLANTATION OF TOTAL HIP
Anesthesia: General | Site: Hip | Laterality: Left

## 2015-09-13 MED ORDER — PHENYLEPHRINE 40 MCG/ML (10ML) SYRINGE FOR IV PUSH (FOR BLOOD PRESSURE SUPPORT)
PREFILLED_SYRINGE | INTRAVENOUS | Status: AC
  Filled 2015-09-13: qty 10

## 2015-09-13 MED ORDER — SUMATRIPTAN SUCCINATE 100 MG PO TABS
100.0000 mg | ORAL_TABLET | Freq: Once | ORAL | Status: DC | PRN
  Filled 2015-09-13: qty 1

## 2015-09-13 MED ORDER — METOCLOPRAMIDE HCL 5 MG/ML IJ SOLN: 5.0000 mg | Freq: Three times a day (TID) | INTRAMUSCULAR | Status: DC | PRN

## 2015-09-13 MED ORDER — DEXAMETHASONE SODIUM PHOSPHATE 4 MG/ML IJ SOLN
INTRAMUSCULAR | Status: DC | PRN
  Administered 2015-09-13: 10 mg via INTRAVENOUS

## 2015-09-13 MED ORDER — BUDESONIDE-FORMOTEROL FUMARATE 160-4.5 MCG/ACT IN AERO
1.0000 | INHALATION_SPRAY | Freq: Two times a day (BID) | RESPIRATORY_TRACT | Status: DC
  Administered 2015-09-13 – 2015-09-15 (×4): 2 via RESPIRATORY_TRACT
  Filled 2015-09-13: qty 6

## 2015-09-13 MED ORDER — LABETALOL HCL 5 MG/ML IV SOLN
5.0000 mg | INTRAVENOUS | Status: DC | PRN
  Administered 2015-09-13: 5 mg via INTRAVENOUS

## 2015-09-13 MED ORDER — CEFAZOLIN SODIUM-DEXTROSE 2-3 GM-% IV SOLR
2.0000 g | Freq: Four times a day (QID) | INTRAVENOUS | Status: AC
  Administered 2015-09-13 – 2015-09-14 (×2): 2 g via INTRAVENOUS
  Filled 2015-09-13 (×2): qty 50

## 2015-09-13 MED ORDER — ALBUMIN HUMAN 5 % IV SOLN
INTRAVENOUS | Status: DC | PRN
  Administered 2015-09-13: 16:00:00 via INTRAVENOUS

## 2015-09-13 MED ORDER — PHENOL 1.4 % MT LIQD: 1.0000 | OROMUCOSAL | Status: DC | PRN

## 2015-09-13 MED ORDER — DOCUSATE SODIUM 100 MG PO CAPS
100.0000 mg | ORAL_CAPSULE | Freq: Two times a day (BID) | ORAL | Status: DC
  Administered 2015-09-13 – 2015-09-16 (×6): 100 mg via ORAL

## 2015-09-13 MED ORDER — PHENYLEPHRINE HCL 10 MG/ML IJ SOLN
INTRAMUSCULAR | Status: DC | PRN
Start: 2015-09-13 — End: 2015-09-13
  Administered 2015-09-13 (×3): 80 ug via INTRAVENOUS
  Administered 2015-09-13: 40 ug via INTRAVENOUS

## 2015-09-13 MED ORDER — METOPROLOL TARTRATE 1 MG/ML IV SOLN
INTRAVENOUS | Status: AC
  Filled 2015-09-13: qty 5

## 2015-09-13 MED ORDER — HYDROMORPHONE HCL 1 MG/ML IJ SOLN
0.5000 mg | INTRAMUSCULAR | Status: DC | PRN
  Administered 2015-09-14 – 2015-09-16 (×16): 1 mg via INTRAVENOUS
  Filled 2015-09-13 (×17): qty 1

## 2015-09-13 MED ORDER — ONDANSETRON HCL 4 MG PO TABS: 4.0000 mg | ORAL_TABLET | Freq: Four times a day (QID) | ORAL | Status: DC | PRN

## 2015-09-13 MED ORDER — METHOCARBAMOL 500 MG PO TABS
500.0000 mg | ORAL_TABLET | Freq: Four times a day (QID) | ORAL | Status: DC | PRN
  Administered 2015-09-14 – 2015-09-16 (×6): 500 mg via ORAL
  Filled 2015-09-13 (×5): qty 1

## 2015-09-13 MED ORDER — ALBUTEROL SULFATE HFA 108 (90 BASE) MCG/ACT IN AERS
INHALATION_SPRAY | RESPIRATORY_TRACT | Status: AC
Start: 2015-09-13 — End: 2015-09-13
  Filled 2015-09-13: qty 6.7

## 2015-09-13 MED ORDER — OXYCODONE HCL 5 MG PO TABS
5.0000 mg | ORAL_TABLET | ORAL | Status: DC | PRN
  Administered 2015-09-14 – 2015-09-16 (×13): 10 mg via ORAL
  Filled 2015-09-13 (×12): qty 2

## 2015-09-13 MED ORDER — ACETAMINOPHEN 500 MG PO TABS
1000.0000 mg | ORAL_TABLET | Freq: Four times a day (QID) | ORAL | Status: AC
  Administered 2015-09-13 – 2015-09-14 (×3): 1000 mg via ORAL
  Filled 2015-09-13 (×3): qty 2

## 2015-09-13 MED ORDER — HYDROMORPHONE HCL 1 MG/ML IJ SOLN
INTRAMUSCULAR | Status: DC | PRN
  Administered 2015-09-13: .4 mg via INTRAVENOUS
  Administered 2015-09-13 (×5): .2 mg via INTRAVENOUS
  Administered 2015-09-13: .4 mg via INTRAVENOUS
  Administered 2015-09-13: .2 mg via INTRAVENOUS

## 2015-09-13 MED ORDER — METHOCARBAMOL 1000 MG/10ML IJ SOLN
500.0000 mg | Freq: Four times a day (QID) | INTRAVENOUS | Status: DC | PRN
  Administered 2015-09-13: 500 mg via INTRAVENOUS
  Filled 2015-09-13 (×2): qty 5

## 2015-09-13 MED ORDER — TIZANIDINE HCL 4 MG PO TABS
4.0000 mg | ORAL_TABLET | Freq: Three times a day (TID) | ORAL | Status: DC | PRN
  Administered 2015-09-13 – 2015-09-16 (×7): 4 mg via ORAL
  Filled 2015-09-13 (×10): qty 1

## 2015-09-13 MED ORDER — MIDAZOLAM HCL 2 MG/2ML IJ SOLN
INTRAMUSCULAR | Status: AC
Start: 2015-09-13 — End: 2015-09-13
  Filled 2015-09-13: qty 2

## 2015-09-13 MED ORDER — HYDROMORPHONE HCL 1 MG/ML IJ SOLN
INTRAMUSCULAR | Status: AC
  Administered 2015-09-13: 0.25 mg via INTRAVENOUS
  Filled 2015-09-13: qty 1

## 2015-09-13 MED ORDER — GABAPENTIN 800 MG PO TABS
2400.0000 mg | ORAL_TABLET | Freq: Two times a day (BID) | ORAL | Status: DC
  Filled 2015-09-13: qty 3

## 2015-09-13 MED ORDER — BUPIVACAINE HCL (PF) 0.25 % IJ SOLN
INTRAMUSCULAR | Status: AC
  Filled 2015-09-13: qty 30

## 2015-09-13 MED ORDER — RIVAROXABAN 10 MG PO TABS
10.0000 mg | ORAL_TABLET | Freq: Every day | ORAL | Status: DC
  Administered 2015-09-14 – 2015-09-16 (×3): 10 mg via ORAL
  Filled 2015-09-13 (×4): qty 1

## 2015-09-13 MED ORDER — MORPHINE SULFATE (PF) 2 MG/ML IV SOLN
1.0000 mg | INTRAVENOUS | Status: DC | PRN
  Administered 2015-09-13 (×2): 2 mg via INTRAVENOUS
  Filled 2015-09-13 (×2): qty 1

## 2015-09-13 MED ORDER — MORPHINE SULFATE 15 MG PO TABS
15.0000 mg | ORAL_TABLET | Freq: Three times a day (TID) | ORAL | Status: DC | PRN
  Administered 2015-09-14 – 2015-09-15 (×3): 15 mg via ORAL
  Filled 2015-09-13 (×3): qty 1

## 2015-09-13 MED ORDER — LACTATED RINGERS IV SOLN: INTRAVENOUS | Status: DC

## 2015-09-13 MED ORDER — POLYETHYLENE GLYCOL 3350 17 G PO PACK: 17.0000 g | PACK | Freq: Every day | ORAL | Status: DC | PRN

## 2015-09-13 MED ORDER — ONDANSETRON HCL 4 MG PO TABS
4.0000 mg | ORAL_TABLET | Freq: Four times a day (QID) | ORAL | Status: DC | PRN
Start: 2015-09-13 — End: 2015-09-16

## 2015-09-13 MED ORDER — METOPROLOL TARTRATE 1 MG/ML IV SOLN
INTRAVENOUS | Status: DC | PRN
  Administered 2015-09-13: 2 mg via INTRAVENOUS
  Administered 2015-09-13: 1 mg via INTRAVENOUS

## 2015-09-13 MED ORDER — ONDANSETRON HCL 4 MG/2ML IJ SOLN
INTRAMUSCULAR | Status: AC
  Filled 2015-09-13: qty 2

## 2015-09-13 MED ORDER — METOCLOPRAMIDE HCL 10 MG PO TABS: 5.0000 mg | ORAL_TABLET | Freq: Three times a day (TID) | ORAL | Status: DC | PRN

## 2015-09-13 MED ORDER — HYDROMORPHONE HCL 2 MG/ML IJ SOLN
INTRAMUSCULAR | Status: AC
  Filled 2015-09-13: qty 1

## 2015-09-13 MED ORDER — GABAPENTIN 400 MG PO CAPS
1200.0000 mg | ORAL_CAPSULE | Freq: Two times a day (BID) | ORAL | Status: DC
  Administered 2015-09-13 – 2015-09-16 (×6): 1200 mg via ORAL
  Filled 2015-09-13 (×7): qty 3

## 2015-09-13 MED ORDER — ACETAMINOPHEN 10 MG/ML IV SOLN
1000.0000 mg | Freq: Once | INTRAVENOUS | Status: AC
  Administered 2015-09-13: 1000 mg via INTRAVENOUS
  Filled 2015-09-13: qty 100

## 2015-09-13 MED ORDER — NEOSTIGMINE METHYLSULFATE 10 MG/10ML IV SOLN
INTRAVENOUS | Status: DC | PRN
Start: 2015-09-13 — End: 2015-09-13
  Administered 2015-09-13: 4 mg via INTRAVENOUS

## 2015-09-13 MED ORDER — LACTATED RINGERS IV SOLN
INTRAVENOUS | Status: DC
  Administered 2015-09-13: 15:00:00 via INTRAVENOUS
  Administered 2015-09-13: 1000 mL via INTRAVENOUS
  Administered 2015-09-13 (×2): via INTRAVENOUS

## 2015-09-13 MED ORDER — GLYCOPYRROLATE 0.2 MG/ML IJ SOLN
INTRAMUSCULAR | Status: DC | PRN
Start: 2015-09-13 — End: 2015-09-13
  Administered 2015-09-13: 0.6 mg via INTRAVENOUS

## 2015-09-13 MED ORDER — ALBUTEROL SULFATE (2.5 MG/3ML) 0.083% IN NEBU: 3.0000 mL | INHALATION_SOLUTION | Freq: Four times a day (QID) | RESPIRATORY_TRACT | Status: DC | PRN

## 2015-09-13 MED ORDER — HYDROMORPHONE HCL 1 MG/ML IJ SOLN
0.2500 mg | INTRAMUSCULAR | Status: DC | PRN
  Administered 2015-09-13 (×2): 0.25 mg via INTRAVENOUS

## 2015-09-13 MED ORDER — PROMETHAZINE HCL 25 MG/ML IJ SOLN: 6.2500 mg | INTRAMUSCULAR | Status: DC | PRN

## 2015-09-13 MED ORDER — FENTANYL CITRATE (PF) 100 MCG/2ML IJ SOLN
INTRAMUSCULAR | Status: DC | PRN
  Administered 2015-09-13: 50 ug via INTRAVENOUS
  Administered 2015-09-13 (×3): 100 ug via INTRAVENOUS

## 2015-09-13 MED ORDER — MIDAZOLAM HCL 2 MG/2ML IJ SOLN
INTRAMUSCULAR | Status: AC
  Filled 2015-09-13: qty 2

## 2015-09-13 MED ORDER — SODIUM CHLORIDE 0.9 % IR SOLN
Status: DC | PRN
  Administered 2015-09-13: 1000 mL

## 2015-09-13 MED ORDER — OXYBUTYNIN CHLORIDE 5 MG PO TABS
5.0000 mg | ORAL_TABLET | Freq: Three times a day (TID) | ORAL | Status: DC
  Administered 2015-09-13 – 2015-09-16 (×8): 5 mg via ORAL
  Filled 2015-09-13 (×10): qty 1

## 2015-09-13 MED ORDER — MORPHINE SULFATE ER 30 MG PO TBCR
30.0000 mg | EXTENDED_RELEASE_TABLET | Freq: Two times a day (BID) | ORAL | Status: DC
Start: 2015-09-13 — End: 2015-09-16
  Administered 2015-09-13 – 2015-09-16 (×6): 30 mg via ORAL
  Filled 2015-09-13 (×7): qty 1

## 2015-09-13 MED ORDER — SODIUM CHLORIDE 0.9 % IV SOLN
INTRAVENOUS | Status: DC
  Administered 2015-09-13 – 2015-09-14 (×2): via INTRAVENOUS
  Administered 2015-09-15: 1000 mL via INTRAVENOUS

## 2015-09-13 MED ORDER — TRAMADOL HCL 50 MG PO TABS
50.0000 mg | ORAL_TABLET | Freq: Four times a day (QID) | ORAL | Status: DC | PRN
  Administered 2015-09-14 – 2015-09-16 (×6): 100 mg via ORAL
  Filled 2015-09-13 (×6): qty 2

## 2015-09-13 MED ORDER — PROMETHAZINE HCL 25 MG PO TABS: 12.5000 mg | ORAL_TABLET | Freq: Four times a day (QID) | ORAL | Status: DC | PRN

## 2015-09-13 MED ORDER — SODIUM CHLORIDE 0.9 % IV SOLN
INTRAVENOUS | Status: DC
  Administered 2015-09-13: 16:00:00 via INTRAVENOUS

## 2015-09-13 MED ORDER — NALOXEGOL OXALATE 25 MG PO TABS
25.0000 mg | ORAL_TABLET | Freq: Every day | ORAL | Status: DC
  Administered 2015-09-13 – 2015-09-15 (×3): 25 mg via ORAL
  Filled 2015-09-13 (×4): qty 1

## 2015-09-13 MED ORDER — PROPOFOL 10 MG/ML IV BOLUS
INTRAVENOUS | Status: AC
  Filled 2015-09-13: qty 20

## 2015-09-13 MED ORDER — ONDANSETRON HCL 4 MG/2ML IJ SOLN
INTRAMUSCULAR | Status: DC | PRN
  Administered 2015-09-13: 4 mg via INTRAVENOUS

## 2015-09-13 MED ORDER — MEPERIDINE HCL 50 MG/ML IJ SOLN: 6.2500 mg | INTRAMUSCULAR | Status: DC | PRN

## 2015-09-13 MED ORDER — FENTANYL CITRATE (PF) 250 MCG/5ML IJ SOLN
INTRAMUSCULAR | Status: AC
  Filled 2015-09-13: qty 5

## 2015-09-13 MED ORDER — PANTOPRAZOLE SODIUM 40 MG PO TBEC
80.0000 mg | DELAYED_RELEASE_TABLET | Freq: Every day | ORAL | Status: DC
  Administered 2015-09-14 – 2015-09-16 (×3): 80 mg via ORAL
  Filled 2015-09-13 (×3): qty 2

## 2015-09-13 MED ORDER — ONDANSETRON HCL 4 MG/2ML IJ SOLN: 4.0000 mg | Freq: Four times a day (QID) | INTRAMUSCULAR | Status: DC | PRN

## 2015-09-13 MED ORDER — ALBUTEROL SULFATE HFA 108 (90 BASE) MCG/ACT IN AERS
INHALATION_SPRAY | RESPIRATORY_TRACT | Status: DC | PRN
Start: 2015-09-13 — End: 2015-09-13
  Administered 2015-09-13: 3 via RESPIRATORY_TRACT

## 2015-09-13 MED ORDER — PROPOFOL 10 MG/ML IV BOLUS
INTRAVENOUS | Status: DC | PRN
  Administered 2015-09-13: 200 mg via INTRAVENOUS

## 2015-09-13 MED ORDER — DIPHENHYDRAMINE HCL 12.5 MG/5ML PO ELIX: 12.5000 mg | ORAL_SOLUTION | ORAL | Status: DC | PRN

## 2015-09-13 MED ORDER — DEXAMETHASONE SODIUM PHOSPHATE 10 MG/ML IJ SOLN
10.0000 mg | Freq: Once | INTRAMUSCULAR | Status: AC
  Administered 2015-09-14: 10 mg via INTRAVENOUS
  Filled 2015-09-13: qty 1

## 2015-09-13 MED ORDER — 0.9 % SODIUM CHLORIDE (POUR BTL) OPTIME
TOPICAL | Status: DC | PRN
  Administered 2015-09-13: 1000 mL

## 2015-09-13 MED ORDER — ACETAMINOPHEN 325 MG PO TABS: 650.0000 mg | ORAL_TABLET | Freq: Four times a day (QID) | ORAL | Status: DC | PRN

## 2015-09-13 MED ORDER — LABETALOL HCL 5 MG/ML IV SOLN
INTRAVENOUS | Status: AC
  Administered 2015-09-13: 5 mg via INTRAVENOUS
  Filled 2015-09-13: qty 4

## 2015-09-13 MED ORDER — TRAZODONE HCL 50 MG PO TABS
150.0000 mg | ORAL_TABLET | Freq: Every evening | ORAL | Status: DC | PRN
  Administered 2015-09-14: 150 mg via ORAL
  Filled 2015-09-13: qty 3

## 2015-09-13 MED ORDER — MENTHOL 3 MG MT LOZG
1.0000 | LOZENGE | OROMUCOSAL | Status: DC | PRN
  Filled 2015-09-13: qty 9

## 2015-09-13 MED ORDER — ACETAMINOPHEN 650 MG RE SUPP: 650.0000 mg | Freq: Four times a day (QID) | RECTAL | Status: DC | PRN

## 2015-09-13 MED ORDER — ROCURONIUM BROMIDE 100 MG/10ML IV SOLN
INTRAVENOUS | Status: DC | PRN
  Administered 2015-09-13: 20 mg via INTRAVENOUS
  Administered 2015-09-13: 10 mg via INTRAVENOUS
  Administered 2015-09-13: 30 mg via INTRAVENOUS

## 2015-09-13 MED ORDER — CEFAZOLIN SODIUM-DEXTROSE 2-3 GM-% IV SOLR
2.0000 g | INTRAVENOUS | Status: AC
  Administered 2015-09-13: 2 g via INTRAVENOUS

## 2015-09-13 MED ORDER — HYDROXYZINE HCL 50 MG PO TABS
50.0000 mg | ORAL_TABLET | Freq: Four times a day (QID) | ORAL | Status: DC | PRN
  Filled 2015-09-13: qty 1

## 2015-09-13 MED ORDER — FLEET ENEMA 7-19 GM/118ML RE ENEM: 1.0000 | ENEMA | Freq: Once | RECTAL | Status: DC | PRN

## 2015-09-13 MED ORDER — SODIUM CHLORIDE 0.9 % IV SOLN: 10.0000 mL/h | Freq: Once | INTRAVENOUS | Status: DC

## 2015-09-13 MED ORDER — ACETAMINOPHEN 10 MG/ML IV SOLN
INTRAVENOUS | Status: AC
  Filled 2015-09-13: qty 100

## 2015-09-13 MED ORDER — DEXAMETHASONE SODIUM PHOSPHATE 10 MG/ML IJ SOLN: 10.0000 mg | Freq: Once | INTRAMUSCULAR | Status: DC

## 2015-09-13 MED ORDER — CHLORHEXIDINE GLUCONATE 4 % EX LIQD: 60.0000 mL | Freq: Once | CUTANEOUS | Status: DC

## 2015-09-13 MED ORDER — MIDAZOLAM HCL 2 MG/2ML IJ SOLN
2.0000 mg | Freq: Once | INTRAMUSCULAR | Status: AC
  Administered 2015-09-13: 2 mg via INTRAVENOUS

## 2015-09-13 MED ORDER — ALBUMIN HUMAN 5 % IV SOLN
INTRAVENOUS | Status: AC
  Filled 2015-09-13: qty 250

## 2015-09-13 MED ORDER — FENTANYL CITRATE (PF) 100 MCG/2ML IJ SOLN
INTRAMUSCULAR | Status: AC
  Filled 2015-09-13: qty 2

## 2015-09-13 MED ORDER — BISACODYL 10 MG RE SUPP: 10.0000 mg | Freq: Every day | RECTAL | Status: DC | PRN

## 2015-09-13 MED ORDER — LIDOCAINE HCL (CARDIAC) 20 MG/ML IV SOLN
INTRAVENOUS | Status: DC | PRN
  Administered 2015-09-13: 50 mg via INTRAVENOUS

## 2015-09-13 MED ORDER — FUROSEMIDE 40 MG PO TABS: 40.0000 mg | ORAL_TABLET | Freq: Two times a day (BID) | ORAL | Status: DC | PRN

## 2015-09-13 SURGICAL SUPPLY — 70 items
BAG ZIPLOCK 12X15 (MISCELLANEOUS) ×9 IMPLANT
BIT DRILL 2.8X128 (BIT) ×2 IMPLANT
BIT DRILL 2.8X128MM (BIT) ×1
BLADE EXTENDED COATED 6.5IN (ELECTRODE) ×3 IMPLANT
BLADE SAW SAG 73X25 THK (BLADE) ×2
BLADE SAW SGTL 73X25 THK (BLADE) ×1 IMPLANT
BNDG COHESIVE 6X5 TAN STRL LF (GAUZE/BANDAGES/DRESSINGS) ×3 IMPLANT
CUP PINNACLE SZ 62MM ×3 IMPLANT
DRAPE INCISE IOBAN 66X45 STRL (DRAPES) ×3 IMPLANT
DRAPE ORTHO SPLIT 77X108 STRL (DRAPES) ×4
DRAPE POUCH INSTRU U-SHP 10X18 (DRAPES) ×3 IMPLANT
DRAPE SURG ORHT 6 SPLT 77X108 (DRAPES) ×2 IMPLANT
DRAPE U-SHAPE 47X51 STRL (DRAPES) ×3 IMPLANT
DRSG ADAPTIC 3X8 NADH LF (GAUZE/BANDAGES/DRESSINGS) ×3 IMPLANT
DRSG EMULSION OIL 3X16 NADH (GAUZE/BANDAGES/DRESSINGS) IMPLANT
DRSG MEPILEX BORDER 4X12 (GAUZE/BANDAGES/DRESSINGS) ×6 IMPLANT
DRSG MEPILEX BORDER 4X4 (GAUZE/BANDAGES/DRESSINGS) ×3 IMPLANT
DRSG MEPILEX BORDER 4X8 (GAUZE/BANDAGES/DRESSINGS) ×3 IMPLANT
DURAPREP 26ML APPLICATOR (WOUND CARE) ×3 IMPLANT
ELECT REM PT RETURN 9FT ADLT (ELECTROSURGICAL) ×3
ELECTRODE REM PT RTRN 9FT ADLT (ELECTROSURGICAL) ×1 IMPLANT
EVACUATOR 1/8 PVC DRAIN (DRAIN) ×3 IMPLANT
FACESHIELD WRAPAROUND (MASK) ×12 IMPLANT
FEMORAL STEM LT XL HIP (Stem) ×3 IMPLANT
GAUZE SPONGE 4X4 12PLY STRL (GAUZE/BANDAGES/DRESSINGS) ×3 IMPLANT
GLOVE BIO SURGEON STRL SZ7.5 (GLOVE) ×3 IMPLANT
GLOVE BIO SURGEON STRL SZ8 (GLOVE) ×3 IMPLANT
GLOVE BIOGEL PI IND STRL 8 (GLOVE) ×3 IMPLANT
GLOVE BIOGEL PI INDICATOR 8 (GLOVE) ×6
GOWN STRL REUS W/TWL LRG LVL3 (GOWN DISPOSABLE) ×3 IMPLANT
GOWN STRL REUS W/TWL XL LVL3 (GOWN DISPOSABLE) ×3 IMPLANT
HEAD SROM 36MM PLUS 12 ×1 IMPLANT
IMMOBILIZER KNEE 20 (SOFTGOODS) ×3
IMMOBILIZER KNEE 20 THIGH 36 (SOFTGOODS) ×1 IMPLANT
LINER MARATHON 4 NEUTRAL 36X56 (Hips) ×3 IMPLANT
MANIFOLD NEPTUNE II (INSTRUMENTS) ×3 IMPLANT
NDL SAFETY ECLIPSE 18X1.5 (NEEDLE) IMPLANT
NEEDLE HYPO 18GX1.5 SHARP (NEEDLE)
NS IRRIG 1000ML POUR BTL (IV SOLUTION) IMPLANT
PACK TOTAL JOINT (CUSTOM PROCEDURE TRAY) ×3 IMPLANT
PADDING CAST COTTON 6X4 STRL (CAST SUPPLIES) ×3 IMPLANT
PASSER SUT SWANSON 36MM LOOP (INSTRUMENTS) ×3 IMPLANT
POSITIONER SURGICAL ARM (MISCELLANEOUS) ×3 IMPLANT
REAMER ROD DEEP FLUTE 2.5X950 (INSTRUMENTS) ×3 IMPLANT
SCREW 6.5MMX25MM (Screw) ×3 IMPLANT
SCREW 6.5MMX30MM (Screw) ×3 IMPLANT
SCREW PERIPHERAL BONE SZ 5MX45 (Screw) ×3 IMPLANT
SCREW TPR HD 5.0MM DIA 50 (Screw) ×3 IMPLANT
SLEEVE FEM PROX 20F XXL (Hips) ×3 IMPLANT
SLEEVE SURGEON STRL (DRAPES) ×3 IMPLANT
SPONGE LAP 18X18 X RAY DECT (DISPOSABLE) ×3 IMPLANT
SROM HEAD 36MM PLUS 12 ×3 IMPLANT
STAPLER VISISTAT 35W (STAPLE) ×9 IMPLANT
STOCKINETTE 6  STRL (DRAPES) ×2
STOCKINETTE 6 STRL (DRAPES) ×1 IMPLANT
SUCTION FRAZIER TIP 10 FR DISP (SUCTIONS) ×3 IMPLANT
SUT ETHIBOND NAB CT1 #1 30IN (SUTURE) ×6 IMPLANT
SUT VIC AB 1 CT1 27 (SUTURE) ×6
SUT VIC AB 1 CT1 27XBRD ANTBC (SUTURE) ×3 IMPLANT
SUT VIC AB 2-0 CT1 27 (SUTURE) ×6
SUT VIC AB 2-0 CT1 TAPERPNT 27 (SUTURE) ×3 IMPLANT
SUT VLOC 180 0 24IN GS25 (SUTURE) ×6 IMPLANT
SWAB COLLECTION DEVICE MRSA (MISCELLANEOUS) ×3 IMPLANT
SWAB CULTURE ESWAB REG 1ML (MISCELLANEOUS) IMPLANT
SYR 50ML LL SCALE MARK (SYRINGE) IMPLANT
TOWEL OR 17X26 10 PK STRL BLUE (TOWEL DISPOSABLE) ×6 IMPLANT
TRAY FOLEY W/METER SILVER 14FR (SET/KITS/TRAYS/PACK) ×3 IMPLANT
TRAY FOLEY W/METER SILVER 16FR (SET/KITS/TRAYS/PACK) IMPLANT
WATER STERILE IRR 1500ML POUR (IV SOLUTION) ×3 IMPLANT
YANKAUER SUCT BULB TIP 10FT TU (MISCELLANEOUS) IMPLANT

## 2015-09-13 NOTE — Progress Notes (Signed)
Amy Fargo RN notified pt will be in 1615 in 20 minutes.

## 2015-09-13 NOTE — Anesthesia Procedure Notes (Signed)
Procedure Name: Intubation Date/Time: 09/13/2015 1:43 PM Performed by: Ludwig LeanJONES, Muath Hallam C Pre-anesthesia Checklist: Patient identified, Emergency Drugs available, Suction available and Patient being monitored Patient Re-evaluated:Patient Re-evaluated prior to inductionOxygen Delivery Method: Circle System Utilized Preoxygenation: Pre-oxygenation with 100% oxygen Intubation Type: IV induction Ventilation: Mask ventilation without difficulty Laryngoscope Size: Mac and 3 Grade View: Grade I Tube type: Oral Tube size: 7.5 mm Number of attempts: 1 Airway Equipment and Method: Oral airway Placement Confirmation: ETT inserted through vocal cords under direct vision,  positive ETCO2 and breath sounds checked- equal and bilateral Secured at: 21 cm Tube secured with: Tape Dental Injury: Teeth and Oropharynx as per pre-operative assessment

## 2015-09-13 NOTE — Anesthesia Postprocedure Evaluation (Signed)
  Anesthesia Post-op Note  Patient: [name]  Procedure(s) Performed: Procedure(s) (LRB): REIMPLANTATION OF TOTAL  LEFT HIP ARTHOPLASTY (Left)  Patient Location: PACU  Anesthesia Type: General  Level of Consciousness: awake and alert   Airway and Oxygen Therapy: Patient Spontanous Breathing  Post-op Pain: mild  Post-op Assessment: Post-op Vital signs reviewed, Patient's Cardiovascular Status Stable, Respiratory Function Stable, Patent Airway and No signs of Nausea or vomiting  Last Vitals:  Filed Vitals:   09/13/15 1811  BP: 132/77  Pulse:   Temp:   Resp:     Post-op Vital Signs: stable   Complications: No apparent anesthesia complications

## 2015-09-13 NOTE — Brief Op Note (Signed)
09/13/2015  4:50 PM  PATIENT:  [name]  59 y.o. female  PRE-OPERATIVE DIAGNOSIS:  RESECTION ARTHROPLASTY LEFT HIP  POST-OPERATIVE DIAGNOSIS:  RESECTION ARTHROPLASTY LEFT HIP  PROCEDURE:  Procedure(s): REIMPLANTATION OF TOTAL  LEFT HIP ARTHOPLASTY (Left)  SURGEON:  Surgeon(s) and Role:    * Ollen GrossFrank Arlyce Circle, MD - Primary  PHYSICIAN ASSISTANT:   ASSISTANTS: Avel Peacerew Perkins, PA-C   ANESTHESIA:   general  EBL:  Total I/O In: 3735 [I.V.:3150; Blood:335; IV Piggyback:250] Out: 1700 [Urine:300; Blood:1400]  BLOOD ADMINISTERED:300 CC PRBC  DRAINS: (Medium) Hemovact drain(s) in the left hip with  Suction Open   LOCAL MEDICATIONS USED:  NONE  COUNTS:  YES  TOURNIQUET:  * No tourniquets in log *  DICTATION: .Other Dictation: Dictation Number [id number]  PLAN OF CARE: Admit to inpatient   PATIENT DISPOSITION:  PACU - hemodynamically stable.

## 2015-09-13 NOTE — Transfer of Care (Signed)
Immediate Anesthesia Transfer of Care Note  Patient: [name]  Procedure(s) Performed: Procedure(s): REIMPLANTATION OF TOTAL  LEFT HIP ARTHOPLASTY (Left)  Patient Location: PACU  Anesthesia Type:General  Level of Consciousness: awake, alert  and patient cooperative  Airway & Oxygen Therapy: Patient Spontanous Breathing and Patient connected to face mask oxygen  Post-op Assessment: Report given to RN and Post -op Vital signs reviewed and stable  Post vital signs: Reviewed and stable  Last Vitals:  Filed Vitals:   09/13/15 1051  BP: 138/81  Pulse: 89  Temp: 36.9 C  Resp: 18    Complications: No apparent anesthesia complications

## 2015-09-13 NOTE — Anesthesia Preprocedure Evaluation (Signed)
Anesthesia Evaluation  Patient identified by MRN, date of birth, ID band Patient awake    Reviewed: Allergy & Precautions, H&P , NPO status , Patient's Chart, lab work & pertinent test results  Airway Mallampati: III  TM Distance: >3 FB Neck ROM: Full    Dental  (+) Lower Dentures, Upper Dentures, Dental Advisory Given   Pulmonary sleep apnea , former smoker,  CXR: No acute disease. Mild cardiomegaly.   Pulmonary exam normal breath sounds clear to auscultation       Cardiovascular negative cardio ROS Normal cardiovascular exam Rhythm:Regular Rate:Normal     Neuro/Psych  Headaches, PSYCHIATRIC DISORDERS Depression    GI/Hepatic Neg liver ROS, hiatal hernia, GERD  Medicated,  Endo/Other  negative endocrine ROS  Renal/GU negative Renal ROS  negative genitourinary   Musculoskeletal  (+) Arthritis ,   Abdominal (+) + obese,   Peds negative pediatric ROS (+)  Hematology negative hematology ROS (+)   Anesthesia Other Findings   Reproductive/Obstetrics negative OB ROS                             Anesthesia Physical  Anesthesia Plan  ASA: III  Anesthesia Plan: General   Post-op Pain Management:    Induction: Intravenous  Airway Management Planned: Oral ETT  Additional Equipment:   Intra-op Plan:   Post-operative Plan: Extubation in OR  Informed Consent:   Plan Discussed with: Surgeon  Anesthesia Plan Comments:         Anesthesia Quick Evaluation

## 2015-09-13 NOTE — Interval H&P Note (Signed)
History and Physical Interval Note:  09/13/2015 1:23 PM  [name]  has presented today for surgery, with the diagnosis of RECESSION ARTHROPLASTY LEFT HIP  The various methods of treatment have been discussed with the patient and family. After consideration of risks, benefits and other options for treatment, the patient has consented to  Procedure(s): REIMPLANTATION OF TOTAL  LEFT HIP (Left) as a surgical intervention .  The patient's history has been reviewed, patient examined, no change in status, stable for surgery.  I have reviewed the patient's chart and labs.  Questions were answered to the patient's satisfaction.     Loanne DrillingALUISIO,Destry Bezdek V

## 2015-09-14 LAB — CBC
HEMATOCRIT: 32.2 % — AB (ref 36.0–46.0)
Hemoglobin: 11.2 g/dL — ABNORMAL LOW (ref 12.0–15.0)
MCH: 30.4 pg (ref 26.0–34.0)
MCHC: 34.8 g/dL (ref 30.0–36.0)
MCV: 87.5 fL (ref 78.0–100.0)
Platelets: 179 10*3/uL (ref 150–400)
RBC: 3.68 MIL/uL — ABNORMAL LOW (ref 3.87–5.11)
RDW: 13 % (ref 11.5–15.5)
WBC: 9.4 10*3/uL (ref 4.0–10.5)

## 2015-09-14 LAB — BASIC METABOLIC PANEL
Anion gap: 8 (ref 5–15)
BUN: 9 mg/dL (ref 6–20)
CALCIUM: 8.4 mg/dL — AB (ref 8.9–10.3)
CHLORIDE: 107 mmol/L (ref 101–111)
CO2: 24 mmol/L (ref 22–32)
CREATININE: 0.65 mg/dL (ref 0.44–1.00)
GFR calc Af Amer: >60 mL/min (ref >60)
GFR calc non Af Amer: >60 mL/min (ref >60)
GLUCOSE: 121 mg/dL — AB (ref 65–99)
Potassium: 4.3 mmol/L (ref 3.5–5.1)
Sodium: 139 mmol/L (ref 135–145)

## 2015-09-14 NOTE — Evaluation (Signed)
Physical Therapy Evaluation Patient Details Name: [name] MRN: [id number] DOB: 1956/09/03 Today's Date: 09/14/2015   History of Present Illness  Reimplantation L THR - s/p antibiotic spacer x ~ 1 yr.  Pt with hx of B TKR, multiple L hip procedures, and memory deficits.  Clinical Impression  Pt s/p reimplantations L THR presents with decreased L LE strength/ROM, post op pain, TWB status and posterior THP limiting functional mobility.  Pt would benefit from follow up rehab at SNF level to maximize IND and safety prior to return home with ltd assist.    Follow Up Recommendations SNF    Equipment Recommendations  None recommended by PT    Recommendations for Other Services OT consult     Precautions / Restrictions Precautions Precautions: Fall;Posterior Hip Precaution Booklet Issued: Yes (comment) Precaution Comments: Sign hung on wall Restrictions Weight Bearing Restrictions: Yes LLE Weight Bearing: Touchdown weight bearing      Mobility  Bed Mobility Overal bed mobility: Needs Assistance Bed Mobility: Supine to Sit     Supine to sit: Min assist;+2 for physical assistance;+2 for safety/equipment     General bed mobility comments: cues for sequence, use of R LE to self assist and adherence to THP  Transfers Overall transfer level: Needs assistance Equipment used: Rolling walker (2 wheeled) Transfers: Sit to/from Stand Sit to Stand: Min assist;+2 physical assistance;+2 safety/equipment Stand pivot transfers: Min assist;+2 physical assistance;+2 safety/equipment       General transfer comment: cues for LE management and use of UEs to self assist  Ambulation/Gait             General Gait Details: pt unable to sufficiently WB on UEs to advance R LE and confidently maintain TWB status  Stairs            Wheelchair Mobility    Modified Rankin (Stroke Patients Only)       Balance                                              Pertinent Vitals/Pain Pain Assessment: 0-10 Pain Score: 4  Pain Location: L hip Pain Descriptors / Indicators: Aching;Sore Pain Intervention(s): Limited activity within patient's tolerance;Monitored during session;Premedicated before session;Ice applied    Home Living Family/patient expects to be discharged to:: Skilled nursing facility                      Prior Function Level of Independence: Needs assistance   Gait / Transfers Assistance Needed: used power w/c for most mobility     Comments: has had multiple falls from power chair     Hand Dominance   Dominant Hand: Right    Extremity/Trunk Assessment   Upper Extremity Assessment: Overall WFL for tasks assessed           Lower Extremity Assessment: LLE deficits/detail      Cervical / Trunk Assessment: Normal  Communication   Communication: No difficulties  Cognition Arousal/Alertness: Awake/alert Behavior During Therapy: WFL for tasks assessed/performed Overall Cognitive Status: Within Functional Limits for tasks assessed                      General Comments      Exercises        Assessment/Plan    PT Assessment Patient needs continued PT services  PT Diagnosis Difficulty walking  PT Problem List Decreased strength;Decreased range of motion;Decreased activity tolerance;Decreased mobility;Decreased knowledge of use of DME;Obesity;Pain;Decreased knowledge of precautions  PT Treatment Interventions DME instruction;Gait training;Stair training;Functional mobility training;Therapeutic activities;Therapeutic exercise;Patient/family education   PT Goals (Current goals can be found in the Care Plan section) Acute Rehab PT Goals Patient Stated Goal: Rehab because my husband is useless PT Goal Formulation: With patient Time For Goal Achievement: 09/18/15 Potential to Achieve Goals: Good    Frequency 7X/week   Barriers to discharge        Co-evaluation               End  of Session Equipment Utilized During Treatment: Gait belt Activity Tolerance: Patient tolerated treatment well;Patient limited by fatigue Patient left: in chair;with call bell/phone within reach Nurse Communication: Mobility status         Time: [id number] PT Time Calculation (min) (ACUTE ONLY): 30 min   Charges:   PT Evaluation $Initial PT Evaluation Tier I: 1 Procedure PT Treatments $Therapeutic Activity: 8-22 mins   PT G Codes:        [name] 09/14/2015, 12:47 PM

## 2015-09-14 NOTE — Progress Notes (Signed)
Physical Therapy Treatment Patient Details Name: [name] [name] MRN: [id number] DOB: [birth year] Today's Date: 09/14/2015    History of Present Illness Reimplantation L THR - s/p antibiotic spacer x ~ 1 yr.  Pt with hx of B TKR, multiple L hip procedures, and memory deficits.    PT Comments      Follow Up Recommendations  SNF     Equipment Recommendations  None recommended by PT    Recommendations for Other Services OT consult     Precautions / Restrictions Precautions Precautions: Fall;Posterior Hip Precaution Booklet Issued: Yes (comment) Precaution Comments: Sign hung on wall Restrictions Weight Bearing Restrictions: Yes LLE Weight Bearing: Touchdown weight bearing    Mobility  Bed Mobility Overal bed mobility: Needs Assistance Bed Mobility: Sit to Supine       Sit to supine: Min assist;Mod assist   General bed mobility comments: cues for sequence, use of R LE to self assist and adherence to THP  Transfers Overall transfer level: Needs assistance Equipment used: Rolling walker (2 wheeled) Transfers: Sit to/from Stand Sit to Stand: Min assist;+2 physical assistance;+2 safety/equipment Stand pivot transfers: Min assist;+2 physical assistance;+2 safety/equipment       General transfer comment: cues for LE management and use of UEs to self assist  Ambulation/Gait                 Stairs            Wheelchair Mobility    Modified Rankin (Stroke Patients Only)       Balance                                    Cognition Arousal/Alertness: Awake/alert Behavior During Therapy: WFL for tasks assessed/performed Overall Cognitive Status: Within Functional Limits for tasks assessed                      Exercises      General Comments        Pertinent Vitals/Pain Pain Assessment: 0-10 Pain Score: 6  Pain Location: L hip Pain Descriptors / Indicators: Aching;Sore Pain Intervention(s): Premedicated before  session;Monitored during session;Limited activity within patient's tolerance;Patient requesting pain meds-RN notified    Home Living                      Prior Function            PT Goals (current goals can now be found in the care plan section) Acute Rehab PT Goals Patient Stated Goal: Rehab because my husband is useless PT Goal Formulation: With patient Time For Goal Achievement: 09/18/15 Potential to Achieve Goals: Good Progress towards PT goals: Progressing toward goals    Frequency  7X/week    PT Plan Current plan remains appropriate    Co-evaluation             End of Session Equipment Utilized During Treatment: Gait belt Activity Tolerance: Patient tolerated treatment well;Patient limited by fatigue Patient left: in bed;with call bell/phone within reach     Time: 1534-1550 PT Time Calculation (min) (ACUTE ONLY): 16 min  Charges:  $Therapeutic Activity: 8-22 mins                    G Codes:      [name] 09/14/2015, 4:15 PM

## 2015-09-14 NOTE — Op Note (Signed)
NAMReine Just:  Graham, [name]            ACCOUNT NO.:  [id number]  MEDICAL RECORD NO.:  [id number]  LOCATION:  1615                         [facility]  PHYSICIAN:  Ollen GrossFrank Shanavia Makela, M.D.    DATE OF BIRTH:  1956/01/01  DATE OF PROCEDURE:  09/13/2015 DATE OF DISCHARGE:                              OPERATIVE REPORT   PREOPERATIVE DIAGNOSIS:  Resection arthroplasty, left hip, secondary to infection.  POSTOPERATIVE DIAGNOSIS:  Resection arthroplasty, left hip, secondary to infection.  PROCEDURE:  Left total hip arthroplasty reimplantation.  SURGEON:  Ollen GrossFrank Dalaya Suppa, M.D.  ASSISTANT:  Alexzandrew L. Perkins, P.A.C.  ANESTHESIA:  General.  ESTIMATED BLOOD LOSS:  1500.  DRAINS:  Hemovac x1.  COMPLICATIONS:  None.  CONDITION:  Stable to recovery.  BRIEF CLINICAL NOTE:  [name] is a 59 year old female who had a long complex history in regard to her left hip.  I initially saw her about a year ago, when she presented with a resection arthroplasty secondary to infected hip.  We performed an irrigation, debridement and exchange of antibiotic spacer last year.  She has been on antibiotics and her C- reactive protein and sed rate finally came back to normal.  Aspiration also was negative.  She presents now for total hip arthroplasty reimplantation.  PROCEDURE IN DETAIL:  After successful administration of general anesthetic, the patient was placed in the right lateral decubitus position with the left side up and held with the hip positioner.  The left lower extremity was isolated from her perineum with plastic drapes and prepped and draped in the usual sterile fashion.  The distal aspect of her posterolateral incision was first utilized, skin cut with a 10- blade through the subcutaneous tissue to the fascia lata, which was incised in line with the skin incision.  We then incised the fascia of the vastus lateralis and dissected to the plate.  She had 6 screws in the plate and cables  around the plate.  We kept the cables and removed the 6 screws.  This was from her previous periprosthetic fracture.  I removed the screws to gain access to the canal so I could place a long stem.  Once this was completed, then, we addressed the hip joint.  Incision was then extended proximally utilizing her previous posterolateral approach.  Lot of scarring and I undermined this scar to allow for better approximation of the tissues for later closure.  We cut down through the subcutaneous tissue to the fascia lata, which was incised in line with the skin incision.  I palpated the sciatic nerve, protected it and then isolated the tissue off the posterior aspect of the greater trochanter.  There was tiny bit of fluid and we sent this for stat Gram stain, which ended up being negative.  Identified the spacer and then dislocated the hip.  Removed the acetabular portion of the spacer and then removed the femoral portion.  All the cement came out with the femoral portion.  We addressed the femur first.  The glycocalyx was removed from the femoral canal and then I placed a long guide rod down the femoral canal.  There was a small perforation that had been present in the anterior cortex.  We  bypassed this and the guide rod was passed down into the distal femur.  We did reaming over the guide rod up to 16.5 mm, which allowed for a long 15-mm SROM stem. We then prepared proximally for the SROM, reaming up to 67F and then preparing the sleeve to an extra-extra large.  A trial 67F extra-extra large sleeve was placed.  We then addressed the acetabulum.  Acetabular retractors were placed and the femur was retracted anteriorly to get acetabular exposure.  The fibrous tissue was removed from the acetabulum.  She had a defect superiorly and posteriorly.  I reamed starting at 47 up to 59 mm.  We placed the trial of 62 cup, which had acceptable purchase.  A permanent 62-mm Pinnacle Revision multihole cup  with Gription was impacted.  There was acceptable purchase and there was excellent position, placed in an anatomic position.  I transfixed with two dome screws and three peripheral screws, each with outstanding purchase.  We then placed a 36- mm neutral +4 marathon liner.  On the femoral side, we placed a trial stem, which was an extra-extra long stem, first starting with a 36+ 8 proximal neck and body.  The stem was placed, but it would not have given [place] enough length, so we went to the 36+ 21+ 8, which was the calcar replacement stem.  This had appropriate length.  We then removed the trial and placed the permanent femoral prosthesis, which was a 34F extra-extra large sleeve and then a 20 x 15 extra long bowed stem with a 36+ 21+ 8 neck.  It was impacted in about 25 degrees of anteversion.  This was where it rested based on the version of the femur.  We then placed trial head starting with a 36+ 6 and I needed extra length, so went to 36+ 12.  With the 36+ 12, she had great stability with full extension and full external rotation, 70 degrees of flexion, 40 degrees of adduction, 90 degrees of internal rotation, 90 degrees of flexion and 70 degrees of internal rotation. The permanent 36+ 12 metal head was then placed.  The hip was reduced with the same stability parameters.  By placing the left leg on top of the right, she was within a few millimeters of regaining equal length. She was about an inch short preoperatively.  The wound was then copiously irrigated with saline solution.  The fascia of the vastus lateralis was then closed with a running V-Loc suture.  The posterior soft tissue was reattached to the femur with Ethibond suture.  Fascia lata was then closed over Hemovac drain with a running #1 V-Loc.  Deep subcu was closed with V-Loc.  Superficial subcu was closed with interrupted 2 O Vicryl and skin closed with staples.  Drains hooked to suction.  Incision was cleaned and dried  and a bulky sterile dressing applied.  She was placed into a knee immobilizer, awakened and transported to recovery in stable condition.  Please note that a surgical assistant is a medical necessity for this very complex procedure.  Assistant was necessary for retraction of the vital neurovascular structures and proper positioning of the limb, for preparation of the femoral and acetabular component to prevent fracture or malalignment of the revision construct.     Ollen Gross, M.D.     FA/MEDQ  D:  09/13/2015  T:  09/14/2015  Job:  [id number]

## 2015-09-14 NOTE — Progress Notes (Signed)
   Subjective: 1 Day Post-Op Procedure(s) (LRB): REIMPLANTATION OF TOTAL  LEFT HIP ARTHOPLASTY (Left) Patient reports pain as mild.   We will start therapy today.  Plan is to go Skilled nursing facility after hospital stay.  Objective: Vital signs in last 24 hours: Temp:  [97.3 F (36.3 C)-99.7 F (37.6 C)] 99.7 F (37.6 C) (11/19 0449) Pulse Rate:  [79-119] 119 (11/19 0449) Resp:  [11-23] 16 (11/19 0449) BP: (101-178)/(51-99) 109/51 mmHg (11/19 0449) SpO2:  [94 %-100 %] 95 % (11/19 0449) Weight:  [101.606 kg (224 lb)] 101.606 kg (224 lb) (11/18 1140)  Intake/Output from previous day:  Intake/Output Summary (Last 24 hours) at 09/14/15 0747 Last data filed at 09/14/15 0603  Gross per 24 hour  Intake   5510 ml  Output   2590 ml  Net   2920 ml    Intake/Output this shift:    Labs:  Recent Labs  09/14/15 0339  HGB 11.2*    Recent Labs  09/14/15 0339  WBC 9.4  RBC 3.68*  HCT 32.2*  PLT 179    Recent Labs  09/14/15 0339  NA 139  K 4.3  CL 107  CO2 24  BUN 9  CREATININE 0.65  GLUCOSE 121*  CALCIUM 8.4*   No results for input(s): LABPT, INR in the last 72 hours.  EXAM General - Patient is Alert, Appropriate and Oriented Extremity - Neurologically intact Neurovascular intact Compartment soft Dressing - dressing C/D/I Motor Function - intact, moving foot and toes well on exam.    Past Medical History  Diagnosis Date  . Tenosynovitis of fingers     RIGHT RING AND SMALL-HAD SURGERY 07/12/14 [facility]  . Bronchitis     dx  07-04-2014 (approx) will finished antibiotic 07-14-2014  . History of concussion     x4  --  no residual  . Migraines   . Depression   . 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 GERD (gastroesophageal reflux disease)   . H/O hiatal hernia   . Chronic constipation   . Chronic narcotic use   . Nocturia   . SUI (stress urinary  incontinence, female)   . OA (osteoarthritis)     hips, knees, hips  . 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 Full dentures   . History of cellulitis     lower extremities  . Thinning of skin     easily tears  . Venous stasis dermatitis     BILATERAL LEGS  . Productive cough     RESOLVED - QUIT SMOKING  . Memory problem   . Transfusion history   . OSA on CPAP     study x3   last one few yrs ago  cpap recommended -- DOES NOT USE OR HAVE CPAP.09-05-15 pt states"being evaluated-no cpap machine yet".    Assessment/Plan: 1 Day Post-Op Procedure(s) (LRB): REIMPLANTATION OF TOTAL  LEFT HIP ARTHOPLASTY (Left) Active Problems:   Failed total hip arthroplasty (HCC)   Advance diet Up with therapy Discharge to SNF  DVT Prophylaxis - Xarelto TDWB LLE D/C Knee Immobilizer tomorrow Pull hemovac tomorrow Begin Therapy Hip Precautions   Loanne DrillingALUISIO,Torryn Hudspeth V

## 2015-09-14 NOTE — Care Management Note (Signed)
Case Management Note  Patient Details  Name: [name] MRN: [id number] Date of Birth: May 02, 1956  Subjective/Objective:                  REIMPLANTATION OF TOTAL LEFT HIP ARTHOPLASTY   Action/Plan: CM spoke with patient at the bedside. Patient states [facility] to be [facility]. CSW to follow.   Expected Discharge Date:                  Expected Discharge Plan:  Skilled Nursing Facility  In-House Referral:  Clinical Social Work  Discharge planning Services  CM Consult  Post Acute Care Choice:    Choice offered to:     DME Arranged:    DME Agency:     HH Arranged:    HH Agency:     Status of Service:  Completed, signed off  Medicare Important Message Given:    Date Medicare IM Given:    Medicare IM give by:    Date Additional Medicare IM Given:    Additional Medicare Important Message give by:     If discussed at Long Length of Stay Meetings, dates discussed:    Additional Comments:  Antony HasteBennett, [name] Scronce Harris, RN 09/14/2015, 9:49 AM

## 2015-09-14 NOTE — Discharge Instructions (Addendum)
Dr. Ollen Gross Total Joint Specialist [facility] [address] [place], [place] [address] [phone]   POSTERIOR TOTAL HIP REPLACEMENT POSTOPERATIVE DIRECTIONS  Hip Rehabilitation, Guidelines Following Surgery  The results of a hip operation are greatly improved after range of motion and muscle strengthening exercises. Follow all safety measures which are given to protect your hip. If any of these exercises cause increased pain or swelling in your joint, decrease the amount until you are comfortable again. Then slowly increase the exercises. Call your caregiver if you have problems or questions.   HOME CARE INSTRUCTIONS  Remove items at home which could result in a fall. This includes throw rugs or furniture in walking pathways.   ICE to the affected hip every three hours for 30 minutes at a time and then as needed for pain and swelling.  Continue to use ice on the hip for pain and swelling from surgery. You may notice swelling that will progress down to the foot and ankle.  This is normal after surgery.  Elevate the leg when you are not up walking on it.    Continue to use the breathing machine which will help keep your temperature down.  It is common for your temperature to cycle up and down following surgery, especially at night when you are not up moving around and exerting yourself.  The breathing machine keeps your lungs expanded and your temperature down.  DIET You may resume your previous home diet once your are discharged from the hospital.  DRESSING / WOUND CARE / SHOWERING You may shower 3 days after surgery, but keep the wounds dry during showering.  You may use an occlusive plastic wrap (Press'n Seal for example), NO SOAKING/SUBMERGING IN THE BATHTUB.  If the bandage gets wet, change with a clean dry gauze.  If the incision gets wet, pat the wound dry with a clean towel. You may start showering once you are discharged home but do not submerge  the incision under water. Just pat the incision dry and apply a dry gauze dressing on daily. Change the surgical dressing daily and reapply a dry dressing each time.    ACTIVITY Walk with your walker as instructed. Use walker as long as suggested by your caregivers. Avoid periods of inactivity such as sitting longer than an hour when not asleep. This helps prevent blood clots.  You may resume a sexual relationship in one month or when given the OK by your doctor.  You may return to work once you are cleared by your doctor.  Do not drive a car for 6 weeks or until released by you surgeon.  Do not drive while taking narcotics.  WEIGHT BEARING Other:  Touch Down Weight Bearing ONLY to the left leg.  POSTOPERATIVE CONSTIPATION PROTOCOL Constipation - defined medically as fewer than three stools per week and severe constipation as less than one stool per week.  One of the most common issues patients have following surgery is constipation.  Even if you have a regular bowel pattern at home, your normal regimen is likely to be disrupted due to multiple reasons following surgery.  Combination of anesthesia, postoperative narcotics, change in appetite and fluid intake all can affect your bowels.  In order to avoid complications following surgery, here are some recommendations in order to help you during your recovery period.  Colace (docusate) - Pick up an over-the-counter form of Colace or another stool softener and take twice a day as long as you are  requiring postoperative pain medications.  Take with a full glass of water daily.  If you experience loose stools or diarrhea, hold the colace until you stool forms back up.  If your symptoms do not get better within 1 week or if they get worse, check with your doctor.  Dulcolax (bisacodyl) - Pick up over-the-counter and take as directed by the product packaging as needed to assist with the movement of your bowels.  Take with a full glass of water.  Use  this product as needed if not relieved by Colace only.   MiraLax (polyethylene glycol) - Pick up over-the-counter to have on hand.  MiraLax is a solution that will increase the amount of water in your bowels to assist with bowel movements.  Take as directed and can mix with a glass of water, juice, soda, coffee, or tea.  Take if you go more than two days without a movement. Do not use MiraLax more than once per day. Call your doctor if you are still constipated or irregular after using this medication for 7 days in a row.  If you continue to have problems with postoperative constipation, please contact the office for further assistance and recommendations.  If you experience "the worst abdominal pain ever" or develop nausea or vomiting, please contact the office immediatly for further recommendations for treatment.  ITCHING  If you experience itching with your medications, try taking only a single pain pill, or even half a pain pill at a time.  You can also use Benadryl over the counter for itching or also to help with sleep.   TED HOSE STOCKINGS Wear the elastic stockings on both legs for three weeks following surgery during the day but you may remove then at night for sleeping.  MEDICATIONS See your medication summary on the After Visit Summary that the nursing staff will review with you prior to discharge.  You may have some home medications which will be placed on hold until you complete the course of blood thinner medication.  It is important for you to complete the blood thinner medication as prescribed by your surgeon.  Continue your approved medications as instructed at time of discharge.  PRECAUTIONS If you experience chest pain or shortness of breath - call 911 immediately for transfer to the hospital emergency department.  If you develop a fever greater that 101 F, purulent drainage from wound, increased redness or drainage from wound, foul odor from the wound/dressing, or calf pain -  CONTACT YOUR SURGEON.                                                   FOLLOW-UP APPOINTMENTS Make sure you keep all of your appointments after your operation with your surgeon and caregivers. You should call the office at the above phone number and make an appointment for approximately two weeks after the date of your surgery or on the date instructed by your surgeon outlined in the "After Visit Summary".  RANGE OF MOTION AND STRENGTHENING EXERCISES  These exercises are designed to help you keep full movement of your hip joint. Follow your caregiver's or physical therapist's instructions. Perform all exercises about fifteen times, three times per day or as directed. Exercise both hips, even if you have had only one joint replacement. These exercises can be done on a training (exercise) mat, on  the floor, on a table or on a bed. Use whatever works the best and is most comfortable for you. Use music or television while you are exercising so that the exercises are a pleasant break in your day. This will make your life better with the exercises acting as a break in routine you can look forward to.  Lying on your back, slowly slide your foot toward your buttocks, raising your knee up off the floor. Then slowly slide your foot back down until your leg is straight again.  Lying on your back spread your legs as far apart as you can without causing discomfort.  Lying on your side, raise your upper leg and foot straight up from the floor as far as is comfortable. Slowly lower the leg and repeat.  Lying on your back, tighten up the muscle in the front of your thigh (quadriceps muscles). You can do this by keeping your leg straight and trying to raise your heel off the floor. This helps strengthen the largest muscle supporting your knee.  Lying on your back, tighten up the muscles of your buttocks both with the legs straight and with the knee bent at a comfortable angle while keeping your heel on the floor.       IF YOU ARE TRANSFERRED TO A SKILLED REHAB FACILITY If the patient is transferred to a skilled rehab facility following release from the hospital, a list of the current medications will be sent to the facility for the patient to continue.  When discharged from the skilled rehab facility, please have the facility set up the patient's Home Health Physical Therapy prior to being released. Also, the skilled facility will be responsible for providing the patient with their medications at time of release from the facility to include their pain medication, the muscle relaxants, and their blood thinner medication. If the patient is still at the rehab facility at time of the two week follow up appointment, the skilled rehab facility will also need to assist the patient in arranging follow up appointment in our office and any transportation needs.  MAKE SURE YOU:  Understand these instructions.  Get help right away if you are not doing well or get worse.    Pick up stool softner and laxative for home use following surgery while on pain medications. Do not submerge incision under water. Please use good hand washing techniques while changing dressing each day. May shower starting three days after surgery. Please use a clean towel to pat the incision dry following showers. Continue to use ice for pain and swelling after surgery. Do not use any lotions or creams on the incision until instructed by your surgeon.  Take Xarelto for two and a half more weeks, then discontinue Xarelto. Once the patient has completed the blood thinner regimen, then take a Baby 81 mg Aspirin daily for three more weeks.   Information on my medicine - XARELTO (Rivaroxaban)  This medication education was reviewed with me or my healthcare representative as part of my discharge preparation.  The pharmacist that spoke with me during my hospital stay was:  [facility]  Why was Xarelto prescribed for you? Xarelto  was prescribed for you to reduce the risk of blood clots forming after orthopedic surgery. The medical term for these abnormal blood clots is venous thromboembolism (VTE).  What do you need to know about xarelto ? Take your Xarelto ONCE DAILY at the same time every day. You may take it either with  or without food.  If you have difficulty swallowing the tablet whole, you may crush it and mix in applesauce just prior to taking your dose.  Take Xarelto exactly as prescribed by your doctor and DO NOT stop taking Xarelto without talking to the doctor who prescribed the medication.  Stopping without other VTE prevention medication to take the place of Xarelto may increase your risk of developing a clot.  After discharge, you should have regular check-up appointments with your healthcare provider that is prescribing your Xarelto.    What do you do if you miss a dose? If you miss a dose, take it as soon as you remember on the same day then continue your regularly scheduled once daily regimen the next day. Do not take two doses of Xarelto on the same day.   Important Safety Information A possible side effect of Xarelto is bleeding. You should call your healthcare provider right away if you experience any of the following: ? Bleeding from an injury or your nose that does not stop. ? Unusual colored urine (red or dark brown) or unusual colored stools (red or black). ? Unusual bruising for unknown reasons. ? A serious fall or if you hit your head (even if there is no bleeding).  Some medicines may interact with Xarelto and might increase your risk of bleeding while on Xarelto. To help avoid this, consult your healthcare provider or pharmacist prior to using any new prescription or non-prescription medications, including herbals, vitamins, non-steroidal anti-inflammatory drugs (NSAIDs) and supplements.  This website has more information on Xarelto: [email or web address]

## 2015-09-14 NOTE — [place] FL2 (Signed)
[place] MEDICAID FL2 LEVEL OF CARE SCREENING TOOL     IDENTIFICATION  Patient Name: [name] Birthdate: 01-May-1956 Sex: female Admission Date (Current Location): 09/13/2015  [facility] and [place] Number: Producer, television/film/video and Address:  [facility],  [address], [place] [address]      Provider Number: [id number]  Attending Physician Name and Address:  Ollen Gross, MD  Relative Name and Phone Number:       Current Level of Care: Hospital Recommended Level of Care: Skilled Nursing Facility Prior Approval Number:    Date Approved/Denied:   PASRR Number: [id number]  Discharge Plan: SNF    Current Diagnoses: Patient Active Problem List   Diagnosis Date Noted  . Failed total hip arthroplasty (HCC) 09/13/2015  . 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 Hip dislocation, left (HCC) 10/12/2014  . Left hip pain 10/12/2014  . Pancytopenia (HCC) 10/12/2014  . Depression   . GERD (gastroesophageal reflux disease)   . OA (osteoarthritis)   . Gastroesophageal reflux disease with esophagitis   . OSA on CPAP   . Septic arthritis of hip (HCC) 08/24/2014    Orientation ACTIVITIES/SOCIAL BLADDER RESPIRATION    Self, Time, Situation, Place    Continent Normal  BEHAVIORAL SYMPTOMS/MOOD NEUROLOGICAL BOWEL NUTRITION STATUS      Continent    PHYSICIAN VISITS COMMUNICATION OF NEEDS Height & Weight Skin    Verbally  (177.8 cm) 224 lbs. Surgical wounds          AMBULATORY STATUS RESPIRATION    Assist extensive Normal      Personal Care Assistance Level of Assistance  Dressing, Bathing Bathing Assistance: Maximum assistance   Dressing Assistance: Maximum assistance      Functional Limitations Info                SPECIAL CARE FACTORS FREQUENCY                      Additional Factors Info  Allergies, Code Status Code Status Info: full code Allergies Info: Allergies: Tomato, Vancomycin            Current Medications (09/14/2015): Current Facility-Administered Medications  Medication Dose Route Frequency Provider Last Rate Last Dose  . 0.9 %  sodium chloride infusion   Intravenous Continuous Ollen Gross, MD 75 mL/hr at 09/14/15 1012    . acetaminophen (TYLENOL) tablet 650 mg  650 mg Oral Q6H PRN Ollen Gross, MD       Or  . acetaminophen (TYLENOL) suppository 650 mg  650 mg Rectal Q6H PRN Ollen Gross, MD      . acetaminophen (TYLENOL) tablet 1,000 mg  1,000 mg Oral 4 times per day Ollen Gross, MD   1,000 mg at 09/14/15 1156  . albuterol (PROVENTIL) (2.5 MG/3ML) 0.083% nebulizer solution 3 mL  3 mL Inhalation Q6H PRN Ollen Gross, MD      . bisacodyl (DULCOLAX) suppository 10 mg  10 mg Rectal Daily PRN Ollen Gross, MD      . budesonide-formoterol (SYMBICORT) 160-4.5 MCG/ACT inhaler 1-2 puff  1-2 puff Inhalation BID Ollen Gross, MD   2 puff at 09/14/15 1022  . diphenhydrAMINE (BENADRYL) 12.5 MG/5ML elixir 12.5-25 mg  12.5-25 mg Oral Q4H PRN Ollen Gross, MD      . docusate sodium (COLACE) capsule 100 mg  100 mg Oral BID Ollen Gross, MD   100 mg at 09/14/15 0941  . furosemide (LASIX) tablet 40 mg  40 mg Oral  BID PRN Ollen GrossFrank Aluisio, MD      . gabapentin (NEURONTIN) capsule 1,200 mg  1,200 mg Oral BID Ollen GrossFrank Aluisio, MD   1,200 mg at 09/14/15 0941  . HYDROmorphone (DILAUDID) injection 0.5-1 mg  0.5-1 mg Intravenous Q1H PRN Avel Peacerew Perkins, PA-C   1 mg at 09/14/15 1305  . hydrOXYzine (ATARAX/VISTARIL) tablet 50 mg  50 mg Oral Q6H PRN Ollen GrossFrank Aluisio, MD      . menthol-cetylpyridinium (CEPACOL) lozenge 3 mg  1 lozenge Oral PRN Ollen GrossFrank Aluisio, MD       Or  . phenol (CHLORASEPTIC) mouth spray 1 spray  1 spray Mouth/Throat PRN Ollen GrossFrank Aluisio, MD      . methocarbamol (ROBAXIN) tablet 500 mg  500 mg Oral Q6H PRN Ollen GrossFrank Aluisio, MD   500 mg at 09/14/15 1305   Or  . methocarbamol (ROBAXIN) 500 mg in dextrose 5 % 50 mL IVPB  500 mg Intravenous Q6H PRN Ollen GrossFrank Aluisio, MD   500 mg at 09/13/15  1718  . metoCLOPramide (REGLAN) tablet 5-10 mg  5-10 mg Oral Q8H PRN Ollen GrossFrank Aluisio, MD       Or  . metoCLOPramide (REGLAN) injection 5-10 mg  5-10 mg Intravenous Q8H PRN Ollen GrossFrank Aluisio, MD      . morphine (MS CONTIN) 12 hr tablet 30 mg  30 mg Oral Q12H Ollen GrossFrank Aluisio, MD   30 mg at 09/14/15 0941  . morphine (MSIR) tablet 15 mg  15 mg Oral Q8H PRN Ollen GrossFrank Aluisio, MD      . naloxegol oxalate (MOVANTIK) tablet 25 mg  25 mg Oral QHS Ollen GrossFrank Aluisio, MD   25 mg at 09/13/15 2109  . ondansetron (ZOFRAN) tablet 4 mg  4 mg Oral Q6H PRN Ollen GrossFrank Aluisio, MD       Or  . ondansetron [facility](ZOFRAN) injection 4 mg  4 mg Intravenous Q6H PRN Ollen GrossFrank Aluisio, MD      . ondansetron [facility](ZOFRAN) tablet 4 mg  4 mg Oral Q6H PRN Ollen GrossFrank Aluisio, MD      . oxybutynin (DITROPAN) tablet 5 mg  5 mg Oral TID Ollen GrossFrank Aluisio, MD   5 mg at 09/14/15 0941  . oxyCODONE (Oxy IR/ROXICODONE) immediate release tablet 5-10 mg  5-10 mg Oral Q3H PRN Ollen GrossFrank Aluisio, MD   10 mg at 09/14/15 0941  . pantoprazole (PROTONIX) EC tablet 80 mg  80 mg Oral Daily Ollen GrossFrank Aluisio, MD   80 mg at 09/14/15 0941  . polyethylene glycol (MIRALAX / GLYCOLAX) packet 17 g  17 g Oral Daily PRN Ollen GrossFrank Aluisio, MD      . promethazine (PHENERGAN) tablet 12.5 mg  12.5 mg Oral Q6H PRN Ollen GrossFrank Aluisio, MD      . rivaroxaban Carlena Hurl(XARELTO) tablet 10 mg  10 mg Oral Q breakfast Ollen GrossFrank Aluisio, MD   10 mg at 09/14/15 0847  . sodium phosphate (FLEET) 7-19 GM/118ML enema 1 enema  1 enema Rectal Once PRN Ollen GrossFrank Aluisio, MD      . SUMAtriptan (IMITREX) tablet 100 mg  100 mg Oral Once PRN Ollen GrossFrank Aluisio, MD      . tiZANidine (ZANAFLEX) tablet 4 mg  4 mg Oral TID PRN Ollen GrossFrank Aluisio, MD   4 mg at 09/14/15 0929  . traMADol (ULTRAM) tablet 50-100 mg  50-100 mg Oral Q6H PRN Ollen GrossFrank Aluisio, MD   100 mg at 09/14/15 0929  . traZODone (DESYREL) tablet 150-300 mg  150-300 mg Oral QHS PRN Ollen GrossFrank Aluisio, MD       Do not use this list as  official medication orders. Please verify with discharge summary.  Discharge  Medications:   Medication List    ASK your doctor about these medications        acetaminophen 325 MG tablet  Commonly known as:  TYLENOL  Take 2 tablets (650 mg total) by mouth every 6 (six) hours as needed for mild pain (or Fever >/= 101).     albuterol 108 (90 BASE) MCG/ACT inhaler  Commonly known as:  PROVENTIL HFA;VENTOLIN HFA  Inhale into the lungs.     b complex vitamins tablet  Take 1 tablet by mouth every morning.     budesonide-formoterol 160-4.5 MCG/ACT inhaler  Commonly known as:  SYMBICORT  Inhale 1-2 puffs into the lungs 2 (two) times daily.     CALCIUM MAGNESIUM PO  Take 1 tablet by mouth every morning.     clotrimazole 1 % cream  Commonly known as:  LOTRIMIN  Apply 1 application topically 2 (two) times daily. To area left upper thigh     clotrimazole-betamethasone cream  Commonly known as:  LOTRISONE  Apply 1 application topically 2 (two) times daily as needed (rash in skin folds).     feeding supplement (PRO-STAT SUGAR FREE 64) Liqd  Take 30 mLs by mouth 3 (three) times daily with meals.     ferrous sulfate 325 (65 FE) MG EC tablet  Take 325 mg by mouth 3 (three) times a week.     Fish Oil 1000 MG Caps  Take 1 capsule by mouth daily.     furosemide 40 MG tablet  Commonly known as:  LASIX  Take 40 mg by mouth 2 (two) times daily as needed (fluid retention).     gabapentin 400 MG capsule  Commonly known as:  NEURONTIN  Take 2 capsules (800 mg total) by mouth 2 (two) times daily.     hydrOXYzine 25 MG tablet  Commonly known as:  ATARAX/VISTARIL  Take 50 mg by mouth every 6 (six) hours as needed for anxiety (anxiety).     ibuprofen 200 MG tablet  Commonly known as:  ADVIL,MOTRIN  Take 600-800 mg by mouth every 6 (six) hours as needed for moderate pain.     meloxicam 7.5 MG tablet  Commonly known as:  MOBIC  Take 7.5 mg by mouth 2 (two) times daily.     methocarbamol 750 MG tablet  Commonly known as:  ROBAXIN  Take 750 mg by mouth 2 (two)  times daily as needed for muscle spasms.     methocarbamol 500 MG tablet  Commonly known as:  ROBAXIN  Take 1 tablet (500 mg total) by mouth every 6 (six) hours as needed for muscle spasms.     metoCLOPramide 5 MG tablet  Commonly known as:  REGLAN  Take 1-2 tablets (5-10 mg total) by mouth every 8 (eight) hours as needed for nausea (if ondansetron (ZOFRAN) ineffective.).     morphine 15 MG tablet  Commonly known as:  MSIR  Take 15 mg by mouth every 8 (eight) hours as needed for moderate pain or severe pain.     MOVANTIK PO  Take 25 mg by mouth at bedtime.     multivitamin tablet  Take 1 tablet by mouth every evening.     omeprazole 40 MG capsule  Commonly known as:  PRILOSEC  Take 40 mg by mouth daily.     ondansetron 4 MG tablet  Commonly known as:  ZOFRAN  Take 1 tablet (4 mg total) by mouth every 6 (  six) hours as needed for nausea.     oxybutynin 5 MG tablet  Commonly known as:  DITROPAN  Take 5 mg by mouth 3 (three) times daily.     Oxycodone HCl 10 MG Tabs  Take 1-2 tablets (10-20 mg total) by mouth every 3 (three) hours as needed (pain).     oxymorphone 10 MG 12 hr tablet  Commonly known as:  OPANA ER  Take 10 mg by mouth every 12 (twelve) hours.     oxymorphone 20 MG 12 hr tablet  Commonly known as:  OPANA ER  Take 1 tablet (20 mg total) by mouth 3 (three) times daily.     POTASSIUM PO  Take 1 tablet by mouth at bedtime.     promethazine 12.5 MG tablet  Commonly known as:  PHENERGAN  Take 1 tablet (12.5 mg total) by mouth every 6 (six) hours as needed for nausea or vomiting.     sodium chloride 0.65 % Soln nasal spray  Commonly known as:  OCEAN  Place 2 sprays into both nostrils daily as needed for congestion (dryness).     SUMAtriptan 100 MG tablet  Commonly known as:  IMITREX  Take 100 mg by mouth once. May repeat in 2 hours if headache persists or recurs. NO MORE THAN 2 IN A 24 HOUR PERIOD     tiZANidine 4 MG tablet  Commonly known as:  ZANAFLEX   Take 4 mg by mouth 3 (three) times daily as needed for muscle spasms.     traZODone 150 MG tablet  Commonly known as:  DESYREL  Take 150-300 mg by mouth at bedtime as needed for sleep.     triamcinolone cream 0.1 %  Commonly known as:  KENALOG  Apply 1 application topically 2 (two) times daily as needed (irritation). CALF OF BOTH LEGS - BECAUSE OF SKIN IRRITATION THOUGHT TO BE DUE TO SWELLING OF LEGS     vitamin C 100 MG tablet  Take 100 mg by mouth 3 (three) times a week.     VITAMIN D-3 PO  Take 1 tablet by mouth 2 (two) times daily.     vitamin D (CHOLECALCIFEROL) 400 UNITS tablet  Take 2 tablets (800 Units total) by mouth daily.        Relevant Imaging Results:  Relevant Lab Results:  Recent Labs    Additional Information    Annetta Maw, LCSW

## 2015-09-14 NOTE — Progress Notes (Signed)
OT Note  Patient Details Name: Reine JustDeborah Quincy MRN: [id number] DOB: [birth year]   Cancelled Treatment:    [facility] Dorena BodoLorraine D  Lori Delvecchio [place], [place] [phone] 09/14/2015, 11:48 AM

## 2015-09-14 NOTE — Clinical Social Work Note (Signed)
Clinical Social Work Assessment  Patient Details  Name: [name] MRN: [id number] Date of Birth: 1956-08-06  Date of referral:  09/14/15               Reason for consult:  Facility Placement                Permission sought to share information with:  Facility Art therapist granted to share information::  Yes, Verbal Permission Granted  Name::        Agency::     Relationship::     Contact Information:     Housing/Transportation Living arrangements for the past 2 months:  Single Family Home Source of Information:  Patient Patient Interpreter Needed:    Criminal Activity/Legal Involvement Pertinent to Current Situation/Hospitalization:    Significant Relationships:  Spouse Lives with:  Spouse Do you feel safe going back to the place where you live?    Need for family participation in patient care:  No (Coment)  Care giving concerns:  No caregiver   Facilities manager / plan:  CSW met with pt at bedside to discuss discharge plans.  CSW reviewed pt chart reflecting PT recommendation of SNF.  Pt agreed to SNF and wants [facility] so CSW sent pt information to [facility].  CSW will continue to follow up with pt until discharge.  CSW will arrange for ambulance transport to SNF at discharge  Employment status:  Disabled (Comment on whether or not currently receiving Disability) (has not walked in 2 years) Insurance information:  Managed Care PT Recommendations:  [place] / Referral to community resources:     Patient/Family's Response to care:  Pt discussed 10 prior surgeries and is hoping this is last one.  Pt is married and lives with spouse.  Pt's "best friend" lives on her property in a camper and has been taking care of pt who stated she has not "walked in 2 years".  Pt discussed prior rehab and called them "hell wholes".  Pt stated that she wants [facility] for SNF.  Pt's sister will be able to visit her daily if she goes to  [facility].    Patient/Family's Understanding of and Emotional Response to Diagnosis, Current Treatment, and Prognosis:  Pt understands her diagnosis and has been through many surgeries and rehabs.  Pt hopeful that this is her last surgery.  Emotional Assessment Appearance:  Appears stated age Attitude/Demeanor/Rapport:  Lethargic Affect (typically observed):  Accepting Orientation:  Oriented to Self, Oriented to Place, Oriented to  Time, Oriented to Situation Alcohol / Substance use:    Psych involvement (Current and /or in the community):  No (Comment)  Discharge Needs  Concerns to be addressed:    Readmission within the last 30 days:    Current discharge risk:    Barriers to Discharge:  No Barriers Identified   Carlean Jews, LCSW 09/14/2015, 3:17 PM

## 2015-09-15 LAB — CBC
HCT: 25.8 % — ABNORMAL LOW (ref 36.0–46.0)
Hemoglobin: 8.9 g/dL — ABNORMAL LOW (ref 12.0–15.0)
MCH: 30.7 pg (ref 26.0–34.0)
MCHC: 34.5 g/dL (ref 30.0–36.0)
MCV: 89 fL (ref 78.0–100.0)
PLATELETS: 90 10*3/uL — AB (ref 150–400)
RBC: 2.9 MIL/uL — AB (ref 3.87–5.11)
RDW: 13.3 % (ref 11.5–15.5)
WBC: 5.5 10*3/uL (ref 4.0–10.5)

## 2015-09-15 LAB — BASIC METABOLIC PANEL
ANION GAP: 6 (ref 5–15)
BUN: 11 mg/dL (ref 6–20)
CO2: 28 mmol/L (ref 22–32)
Calcium: 8.7 mg/dL — ABNORMAL LOW (ref 8.9–10.3)
Chloride: 108 mmol/L (ref 101–111)
Creatinine, Ser: 0.61 mg/dL (ref 0.44–1.00)
Glucose, Bld: 109 mg/dL — ABNORMAL HIGH (ref 65–99)
POTASSIUM: 4.3 mmol/L (ref 3.5–5.1)
SODIUM: 142 mmol/L (ref 135–145)

## 2015-09-15 MED ORDER — BUDESONIDE-FORMOTEROL FUMARATE 160-4.5 MCG/ACT IN AERO
2.0000 | INHALATION_SPRAY | Freq: Two times a day (BID) | RESPIRATORY_TRACT | Status: DC
  Administered 2015-09-15 – 2015-09-16 (×2): 2 via RESPIRATORY_TRACT
  Filled 2015-09-15: qty 6

## 2015-09-15 NOTE — Progress Notes (Signed)
     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 States that the pain is much better than after her many other surgeries on the left hip.  Her HV drain is over half full this morning. Discussed with Ladona Ridgelaylor, RN regarding leaving the drain and pulling it at 1800 tonight, just to allow some more time to drain any extra blood. Warned to be prepared to reinforce the area with a pressure dressing.  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 Up with therapy Discharge to SNF eventually, when ready     Anastasio AuerbachMatthew S. Nacole Fluhr   PAC  09/15/2015, 8:31 AM

## 2015-09-15 NOTE — Progress Notes (Signed)
Physical Therapy Treatment Patient Details Name: [name] MRN: [id number] DOB: 11/22/1955 Today's Date: 09/15/2015    History of Present Illness Reimplantation L THR - s/p antibiotic spacer x ~ 1 yr.  Pt with hx of B TKR, multiple L hip procedures, and memory deficits.    PT Comments    Assisted OOB to recliner 1/4 pivot turn using walker.  Performed some L LE TE's.  Applied ICE. Pt also c/o neck and shoulder tightness/pain that comes and goes for quit some time.  Pt would like therapy on that as well.   Follow Up Recommendations  SNF     Equipment Recommendations       Recommendations for Other Services       Precautions / Restrictions Precautions Precautions: Fall;Posterior Hip Precaution Comments: recalls 3/3 THP Restrictions Weight Bearing Restrictions: Yes LLE Weight Bearing: Touchdown weight bearing    Mobility  Bed Mobility Overal bed mobility: Needs Assistance Bed Mobility: Supine to Sit     Supine to sit: Min assist     General bed mobility comments: cues for sequence, use of R LE to self assist and adherence to THP  Transfers Overall transfer level: Needs assistance Equipment used: Rolling walker (2 wheeled) Transfers: Sit to/from Stand Sit to Stand: Min assist;+2 physical assistance;+2 safety/equipment         General transfer comment: cues for LE management and use of UEs to self assist  Ambulation/Gait             General Gait Details: pt unable to sufficiently WB on UEs to advance R LE and confidently maintain TWB status  HAS BEEN TRANSFERS ONLY FOR OVER A YEAR   Stairs            Wheelchair Mobility    Modified Rankin (Stroke Patients Only)       Balance                                    Cognition Arousal/Alertness: Awake/alert Behavior During Therapy: WFL for tasks assessed/performed Overall Cognitive Status: Within Functional Limits for tasks assessed                       Exercises  b LE AP x 20 B LE knee presses x10 hold for 5 sec B butt squeezes x 10 hold for 5 sec L LE LAQ's x 10 hold for 5 sec    General Comments        Pertinent Vitals/Pain Pain Assessment: 0-10 Pain Score: 7  Pain Location: l HIP Pain Descriptors / Indicators: Aching;Sore Pain Intervention(s): Monitored during session;Premedicated before session;Repositioned;Ice applied    Home Living                      Prior Function            PT Goals (current goals can now be found in the care plan section) Progress towards PT goals: Progressing toward goals    Frequency  7X/week    PT Plan Current plan remains appropriate    Co-evaluation             End of Session Equipment Utilized During Treatment: Gait belt Activity Tolerance: Patient tolerated treatment well;Patient limited by fatigue Patient left: in chair;with call bell/phone within reach     Time: 1110-1135 PT Time Calculation (min) (ACUTE ONLY): 25 min  Charges:  $Therapeutic Exercise:  8-22 mins $Therapeutic Activity: 8-22 mins                    G Codes:      Rica Koyanagi  PTA [facility]  Acute  Rehab Pager      [phone]

## 2015-09-15 NOTE — Progress Notes (Signed)
Utilization Review Completed.Deaundra Kutzer, Ronelle T11/20/2016  

## 2015-09-16 LAB — CBC
HCT: 26.7 % — ABNORMAL LOW (ref 36.0–46.0)
HEMOGLOBIN: 9 g/dL — AB (ref 12.0–15.0)
MCH: 30.3 pg (ref 26.0–34.0)
MCHC: 33.7 g/dL (ref 30.0–36.0)
MCV: 89.9 fL (ref 78.0–100.0)
Platelets: 103 10*3/uL — ABNORMAL LOW (ref 150–400)
RBC: 2.97 MIL/uL — AB (ref 3.87–5.11)
RDW: 13.5 % (ref 11.5–15.5)
WBC: 6.2 10*3/uL (ref 4.0–10.5)

## 2015-09-16 LAB — BODY FLUID CULTURE: Culture: NO GROWTH

## 2015-09-16 MED ORDER — RIVAROXABAN 10 MG PO TABS: 10.0000 mg | ORAL_TABLET | Freq: Every day | ORAL | Status: AC

## 2015-09-16 MED ORDER — TRAMADOL HCL 50 MG PO TABS: 50.0000 mg | ORAL_TABLET | Freq: Four times a day (QID) | ORAL | Status: DC | PRN

## 2015-09-16 MED ORDER — POLYETHYLENE GLYCOL 3350 17 G PO PACK: 17.0000 g | PACK | Freq: Every day | ORAL | Status: AC | PRN

## 2015-09-16 MED ORDER — DIPHENHYDRAMINE HCL 12.5 MG/5ML PO ELIX: 12.5000 mg | ORAL_SOLUTION | ORAL | Status: AC | PRN

## 2015-09-16 MED ORDER — MORPHINE SULFATE 15 MG PO TABS: 15.0000 mg | ORAL_TABLET | Freq: Three times a day (TID) | ORAL | Status: AC | PRN

## 2015-09-16 MED ORDER — METHOCARBAMOL 500 MG PO TABS: 500.0000 mg | ORAL_TABLET | Freq: Four times a day (QID) | ORAL | Status: AC | PRN

## 2015-09-16 MED ORDER — BISACODYL 10 MG RE SUPP: 10.0000 mg | Freq: Every day | RECTAL | Status: AC | PRN

## 2015-09-16 MED ORDER — FLEET ENEMA 7-19 GM/118ML RE ENEM: 1.0000 | ENEMA | Freq: Once | RECTAL | Status: AC | PRN

## 2015-09-16 MED ORDER — OXYCODONE HCL 5 MG PO TABS: 5.0000 mg | ORAL_TABLET | ORAL | Status: AC | PRN

## 2015-09-16 MED ORDER — OXYMORPHONE HCL ER 10 MG PO TB12: 10.0000 mg | ORAL_TABLET | Freq: Two times a day (BID) | ORAL | Status: AC

## 2015-09-16 MED ORDER — OXYMORPHONE HCL ER 20 MG PO TB12: 20.0000 mg | ORAL_TABLET | Freq: Three times a day (TID) | ORAL | Status: DC

## 2015-09-16 MED ORDER — DOCUSATE SODIUM 100 MG PO CAPS: 100.0000 mg | ORAL_CAPSULE | Freq: Two times a day (BID) | ORAL | Status: AC

## 2015-09-16 NOTE — Progress Notes (Signed)
Pt report called to [facility]. All questions and concerns were addressed. Pt is alert and oriented times 4, VSS, pain meds have been given, with exception of incision site all other skin is dry and intact. Waiting on [facility] for transport.

## 2015-09-16 NOTE — Clinical Social Work Placement (Addendum)
   CLINICAL SOCIAL WORK PLACEMENT  NOTE  Date:  09/16/2015  Patient Details  Name: [name] MRN: [id number] Date of Birth: 01-12-1956  Clinical Social Work is seeking post-discharge placement for this patient at the Skilled  Nursing Facility level of care (*CSW will initial, date and re-position this form in  chart as items are completed):  Yes   Patient/family provided with [facility] Clinical Social Work Department's list of facilities offering this level of care within the geographic area requested by the patient (or if unable, by the patient's family).  Yes   Patient/family informed of their freedom to choose among providers that offer the needed level of care, that participate in Medicare, Medicaid or managed care program needed by the patient, have an available bed and are willing to accept the patient.  Yes   Patient/family informed of [facility] ownership interest in [facility] and [facility], as well as of the fact that they are under no obligation to receive care at these facilities.  PASRR submitted to EDS on       PASRR number received on       Existing PASRR number confirmed on 09/14/15     FL2 transmitted to all facilities in geographic area requested by pt/family on 09/14/15     FL2 transmitted to all facilities within larger geographic area on       Patient informed that his/her managed care company has contracts with or will negotiate with certain facilities, including the following:            Patient/family informed of bed offers received.  Patient [facility] bed at  [facility]    Physician recommends and patient chooses bed at      Patient to be transferred to  [facility]  on  .09/16/2015  Patient to be transferred to facility by       Patient family notified on   of transfer.  Name of family member notified:        PHYSICIAN       Additional Comment:    _______________________________________________ Leron Croakassandra  Rishav Rockefeller 09/16/2015, 10:54 AM

## 2015-09-16 NOTE — Progress Notes (Signed)
Pt a dime sized abrasion on her LLE where the skin is broken.  She stated she hit her leg at home before she came to hospital.  Per request, applied tegaderm.  Will continue to monitor.

## 2015-09-16 NOTE — Care Management Important Message (Signed)
Important Message  Patient Details  Name: Reine JustDeborah Magan MRN: [id number] Date of Birth: 10-Feb-1956   Medicare Important Message Given:  Yes    Haskell FlirtJamison, Taeler Winning 09/16/2015, 11:52 AMImportant Message  Patient Details  Name: Reine JustDeborah Barrantes MRN: [id number] Date of Birth: 10-Feb-1956   Medicare Important Message Given:  Yes    Haskell FlirtJamison, Kary Sugrue 09/16/2015, 11:51 AM

## 2015-09-16 NOTE — Progress Notes (Signed)
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   Patient seen in rounds by Dr. Lequita HaltAluisio.  She is sitting up in bed. In good spirits. Patient is well, and has had no acute complaints or problems Patient is ready to go to the SNF for further therapy.  Objective: Vital signs in last 24 hours: Temp:  [97.5 F (36.4 C)-98.6 F (37 C)] 98.6 F (37 C) (11/21 0407) Pulse Rate:  [75-79] 79 (11/21 0407) Resp:  [16] 16 (11/21 0407) BP: (80-114)/(37-60) 114/52 mmHg (11/21 0407) SpO2:  [97 %-100 %] 98 % (11/21 0407)  Intake/Output from previous day:  Intake/Output Summary (Last 24 hours) at 09/16/15 0721 Last data filed at 09/16/15 0358  Gross per 24 hour  Intake 1967.5 ml  Output   3360 ml  Net -1392.5 ml    Labs:  Recent Labs  09/14/15 0339 09/15/15 0550 09/16/15 0505  HGB 11.2* 8.9* 9.0*    Recent Labs  09/15/15 0550 09/16/15 0505  WBC 5.5 6.2  RBC 2.90* 2.97*  HCT 25.8* 26.7*  PLT 90* 103*    Recent Labs  09/14/15 0339 09/15/15 0550  NA 139 142  K 4.3 4.3  CL 107 108  CO2 24 28  BUN 9 11  CREATININE 0.65 0.61  GLUCOSE 121* 109*  CALCIUM 8.4* 8.7*   No results for input(s): LABPT, INR in the last 72 hours.  EXAM: General - Patient is Alert, Appropriate and Oriented Extremity - Neurovascular intact Sensation intact distally Dorsiflexion/Plantar flexion intact Incision - clean, dry, no drainage, healing Motor Function - intact, moving foot and toes well on exam.   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 Tenosynovitis of fingers     RIGHT RING AND SMALL-HAD SURGERY 07/12/14 [facility]  . Bronchitis     dx  07-04-2014 (approx) will finished antibiotic 07-14-2014  .  History of concussion     x4  --  no residual  . Migraines   . Depression   . 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 GERD (gastroesophageal reflux disease)   . H/O hiatal hernia   . Chronic constipation   . Chronic narcotic use   . Nocturia   . SUI (stress urinary incontinence, female)   . OA (osteoarthritis)     hips, knees, hips  . 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 Full dentures   . History of cellulitis     lower extremities  . Thinning of skin     easily tears  . Venous stasis dermatitis     BILATERAL LEGS  . Productive cough     RESOLVED - QUIT SMOKING  . Memory problem   . Transfusion history   . OSA on CPAP     study x3   last one few yrs ago  cpap recommended -- DOES NOT USE OR HAVE CPAP.09-05-15 pt states"being evaluated-no cpap machine yet".   Active Problems:   Failed total hip arthroplasty (HCC)  Estimated body mass index is 32.14 kg/(m^2) as calculated from the following:   Height as  of this encounter:  (1.778 m).   Weight as of this encounter: 101.606 kg (224 lb). Up with therapy Discharge to SNF Diet - Regular diet Follow up - in 2 weeks, next Thursday Dec 1st. Activity - TDWB Disposition - Skilled nursing facility Condition Upon Discharge - Stable D/C Meds - See DC Summary DVT Prophylaxis - Xarelto  Avel Peace, PA-C Orthopaedic Surgery 09/16/2015, 7:21 AM

## 2015-09-16 NOTE — Progress Notes (Signed)
Pt to be d/c today to [facility].  Pt and family agreeable. Confirmed plans with facility.  Plan transfer via EMS.    Leron Croakassandra Nikan Ellingson LCSWA

## 2015-09-16 NOTE — Discharge Summary (Signed)
Physician Discharge Summary   Patient ID: [name] MRN: [id number] DOB/AGE: 07-11-1956 59 y.o.  Admit date: 09/13/2015 Discharge date: 09-16-2015  Primary Diagnosis:  Resection arthroplasty, left hip, secondary to infection. Admission Diagnoses:  Past Medical History  Diagnosis Date  . Tenosynovitis of fingers     RIGHT RING AND SMALL-HAD SURGERY 07/12/14  [facility]  . Bronchitis     dx  07-04-2014 (approx) will finished antibiotic 07-14-2014  . History of concussion     x4  --  no residual  . Migraines   . Depression   . 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 GERD (gastroesophageal reflux disease)   . H/O hiatal hernia   . Chronic constipation   . Chronic narcotic use   . Nocturia   . SUI (stress urinary incontinence, female)   . OA (osteoarthritis)     hips, knees, hips  . 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 Full dentures   . History of cellulitis     lower extremities  . Thinning of skin     easily tears  . Venous stasis dermatitis     BILATERAL LEGS  . Productive cough     RESOLVED - QUIT SMOKING  . Memory problem   . Transfusion history   . OSA on CPAP     study x3   last one few yrs ago  cpap recommended -- DOES NOT USE OR HAVE CPAP.09-05-15 pt states"being evaluated-no cpap machine yet".   Discharge Diagnoses:   Active Problems:   Failed total hip arthroplasty ([place])  Estimated body mass index is 32.14 kg/(m^2) as calculated from the following:   Height as of this encounter: 5' 10"  (1.778 m).   Weight as of this encounter: 101.606 kg (224 lb).  Procedure(s) (LRB): REIMPLANTATION OF TOTAL  LEFT HIP ARTHOPLASTY (Left)   Consults: None  HPI: [name] is a 59 year old female who had a long complex history in regard to her left hip. I initially saw her about a year  ago, when she presented with a resection arthroplasty secondary to infected hip. We performed an irrigation, debridement and exchange of antibiotic spacer last year. She has been on antibiotics and her C- reactive protein and sed rate finally came back to normal. Aspiration also was negative. She presents now for total hip arthroplasty Reimplantation.  Laboratory Data: Admission on 09/13/2015  Component Date Value Ref Range Status  . ABO/RH(D) 09/13/2015 O POS   Final  . Antibody Screen 09/13/2015 POS   Final  . Sample Expiration 09/13/2015 09/16/2015   Final  . Unit Number 09/13/2015 [id number]   Final  . Blood Component Type 09/13/2015 RED CELLS,LR   Final  . Unit division 09/13/2015 00   Final  . Status of Unit 09/13/2015 ISSUED,FINAL   Final  . Donor AG Type 09/13/2015 NEGATIVE FOR DUFFY A ANTIGEN NEGATIVE FOR E ANTIGEN   Final  . Transfusion Status 09/13/2015 OK TO TRANSFUSE   Final  . Crossmatch Result 09/13/2015 COMPATIBLE   Final  . Unit Number 09/13/2015 [id number]   Final  . Blood Component Type 09/13/2015 RED CELLS,LR   Final  . Unit division 09/13/2015 00   Final  . Status of Unit 09/13/2015 ALLOCATED   Final  . Donor AG Type 09/13/2015  NEGATIVE FOR DUFFY A ANTIGEN NEGATIVE FOR E ANTIGEN   Final  . Transfusion Status 09/13/2015 OK TO TRANSFUSE   Final  . Crossmatch Result 09/13/2015 COMPATIBLE   Final  . Unit Number 09/13/2015 [id number]   Final  . Blood Component Type 09/13/2015 RED CELLS,LR   Final  . Unit division 09/13/2015 00   Final  . Status of Unit 09/13/2015 ALLOCATED   Final  . Donor AG Type 09/13/2015 NEGATIVE FOR DUFFY A ANTIGEN NEGATIVE FOR E ANTIGEN   Final  . Transfusion Status 09/13/2015 OK TO TRANSFUSE   Final  . Crossmatch Result 09/13/2015 COMPATIBLE   Final  . Specimen Description 09/13/2015 FLUID   Final  . Special Requests 09/13/2015 NONE   Final  . Gram Stain 09/13/2015    Final                   Value:RARE WBC PRESENT,BOTH PMN AND  MONONUCLEAR NO ORGANISMS SEEN Performed at [facility]   . Culture 09/13/2015    Final                   Value:NO ANAEROBES ISOLATED; CULTURE IN PROGRESS FOR 5 DAYS Performed at [facility]   . Report Status 09/13/2015 PENDING   Incomplete  . Specimen Description 09/13/2015 FLUID   Final  . Special Requests 09/13/2015 NONE   Final  . Gram Stain 09/13/2015    Final                   Value:RARE WBC PRESENT,BOTH PMN AND MONONUCLEAR NO ORGANISMS SEEN Gram Stain Report Called to,Read Back By and Verified With: Donzetta Starch [id number] @ [address]   . Culture 09/13/2015    Final                   Value:NO GROWTH 2 DAYS Performed at [facility]   . Report Status 09/13/2015 PENDING   Incomplete  . Order Confirmation 09/13/2015 ORDER PROCESSED BY BLOOD BANK   Final  . WBC 09/14/2015 9.4  4.0 - 10.5 K/uL Final  . RBC 09/14/2015 3.68* 3.87 - 5.11 MIL/uL Final  . Hemoglobin 09/14/2015 11.2* 12.0 - 15.0 g/dL Final  . HCT 09/14/2015 32.2* 36.0 - 46.0 % Final  . MCV 09/14/2015 87.5  78.0 - 100.0 fL Final  . MCH 09/14/2015 30.4  26.0 - 34.0 pg Final  . MCHC 09/14/2015 34.8  30.0 - 36.0 g/dL Final  . RDW 09/14/2015 13.0  11.5 - 15.5 % Final  . Platelets 09/14/2015 179  150 - 400 K/uL Final  . Sodium 09/14/2015 139  135 - 145 mmol/L Final  . Potassium 09/14/2015 4.3  3.5 - 5.1 mmol/L Final  . Chloride 09/14/2015 107  101 - 111 mmol/L Final  . CO2 09/14/2015 24  22 - 32 mmol/L Final  . Glucose, Bld 09/14/2015 121* 65 - 99 mg/dL Final  . BUN 09/14/2015 9  6 - 20 mg/dL Final  . Creatinine, Ser 09/14/2015 0.65  0.44 - 1.00 mg/dL Final  . Calcium 09/14/2015 8.4* 8.9 - 10.3 mg/dL Final  . GFR calc non Af Amer 09/14/2015 >60  >60 mL/min Final  . GFR calc Af Amer 09/14/2015 >60  >60 mL/min Final   Comment: (NOTE) The eGFR has been calculated using the CKD EPI equation. This calculation has not been validated in all clinical situations. eGFR's persistently <60  mL/min signify possible Chronic Kidney Disease.   Georgiann Hahn gap 09/14/2015  8  5 - 15 Final  . WBC 09/15/2015 5.5  4.0 - 10.5 K/uL Final  . RBC 09/15/2015 2.90* 3.87 - 5.11 MIL/uL Final  . Hemoglobin 09/15/2015 8.9* 12.0 - 15.0 g/dL Final   Comment: DELTA CHECK NOTED REPEATED TO VERIFY   . HCT 09/15/2015 25.8* 36.0 - 46.0 % Final  . MCV 09/15/2015 89.0  78.0 - 100.0 fL Final  . MCH 09/15/2015 30.7  26.0 - 34.0 pg Final  . MCHC 09/15/2015 34.5  30.0 - 36.0 g/dL Final  . RDW 09/15/2015 13.3  11.5 - 15.5 % Final  . Platelets 09/15/2015 90* 150 - 400 K/uL Final   Comment: DELTA CHECK NOTED REPEATED TO VERIFY SPECIMEN CHECKED FOR CLOTS PLATELET COUNT CONFIRMED BY SMEAR   . Sodium 09/15/2015 142  135 - 145 mmol/L Final  . Potassium 09/15/2015 4.3  3.5 - 5.1 mmol/L Final  . Chloride 09/15/2015 108  101 - 111 mmol/L Final  . CO2 09/15/2015 28  22 - 32 mmol/L Final  . Glucose, Bld 09/15/2015 109* 65 - 99 mg/dL Final  . BUN 09/15/2015 11  6 - 20 mg/dL Final  . Creatinine, Ser 09/15/2015 0.61  0.44 - 1.00 mg/dL Final  . Calcium 09/15/2015 8.7* 8.9 - 10.3 mg/dL Final  . GFR calc non Af Amer 09/15/2015 >60  >60 mL/min Final  . GFR calc Af Amer 09/15/2015 >60  >60 mL/min Final   Comment: (NOTE) The eGFR has been calculated using the CKD EPI equation. This calculation has not been validated in all clinical situations. eGFR's persistently <60 mL/min signify possible Chronic Kidney Disease.   . Anion gap 09/15/2015 6  5 - 15 Final  . WBC 09/16/2015 6.2  4.0 - 10.5 K/uL Final  . RBC 09/16/2015 2.97* 3.87 - 5.11 MIL/uL Final  . Hemoglobin 09/16/2015 9.0* 12.0 - 15.0 g/dL Final  . HCT 09/16/2015 26.7* 36.0 - 46.0 % Final  . MCV 09/16/2015 89.9  78.0 - 100.0 fL Final  . MCH 09/16/2015 30.3  26.0 - 34.0 pg Final  . MCHC 09/16/2015 33.7  30.0 - 36.0 g/dL Final  . RDW 09/16/2015 13.5  11.5 - 15.5 % Final  . Platelets 09/16/2015 103* 150 - 400 K/uL Final   [facility] Outpatient Visit on 09/05/2015  Component Date Value Ref Range Status  . aPTT 09/05/2015 30  24 - 37 seconds Final  . Sodium 09/05/2015 138  135 - 145 mmol/L Final  . Potassium 09/05/2015 4.2  3.5 - 5.1 mmol/L Final  . Chloride 09/05/2015 103  101 - 111 mmol/L Final  . CO2 09/05/2015 29  22 - 32 mmol/L Final  . Glucose, Bld 09/05/2015 119* 65 - 99 mg/dL Final  . BUN 09/05/2015 11  6 - 20 mg/dL Final  . Creatinine, Ser 09/05/2015 0.66  0.44 - 1.00 mg/dL Final  . Calcium 09/05/2015 9.6  8.9 - 10.3 mg/dL Final  . Total Protein 09/05/2015 7.3  6.5 - 8.1 g/dL Final  . Albumin 09/05/2015 4.0  3.5 - 5.0 g/dL Final  . AST 09/05/2015 26  15 - 41 U/L Final  . ALT 09/05/2015 17  14 - 54 U/L Final  . Alkaline Phosphatase 09/05/2015 93  38 - 126 U/L Final  . Total Bilirubin 09/05/2015 0.8  0.3 - 1.2 mg/dL Final  . GFR calc non Af Amer 09/05/2015 >60  >60 mL/min Final  . GFR calc Af Amer 09/05/2015 >60  >60 mL/min Final  Comment: (NOTE) The eGFR has been calculated using the CKD EPI equation. This calculation has not been validated in all clinical situations. eGFR's persistently <60 mL/min signify possible Chronic Kidney Disease.   . Anion gap 09/05/2015 6  5 - 15 Final  . Prothrombin Time 09/05/2015 14.1  11.6 - 15.2 seconds Final  . INR 09/05/2015 1.07  0.00 - 1.49 Final  . ABO/RH(D) 09/05/2015 O POS   Final  . Antibody Screen 09/05/2015 POS   Final  . Sample Expiration 09/05/2015 09/08/2015   Final  . Antibody Identification 36/14/4315 NO CLINICALLY SIGNIFICANT ANTIBODY IDENTIFIED   Final  . DAT, IgG 09/05/2015 NEG   Final  . MRSA, PCR 09/05/2015 NEGATIVE  NEGATIVE Final  . Staphylococcus aureus 09/05/2015 NEGATIVE  NEGATIVE Final   Comment:        The Xpert SA Assay (FDA approved for NASAL specimens in patients over 58 years of age), is one component of a comprehensive surveillance program.  Test performance has been validated by [facility] for patients greater than or  equal to 73 year old. It is not intended to diagnose infection nor to guide or monitor treatment.   . Color, Urine 09/05/2015 YELLOW  YELLOW Final  . APPearance 09/05/2015 CLEAR  CLEAR Final  . Specific Gravity, Urine 09/05/2015 1.011  1.005 - 1.030 Final  . pH 09/05/2015 5.5  5.0 - 8.0 Final  . Glucose, UA 09/05/2015 NEGATIVE  NEGATIVE mg/dL Final  . Hgb urine dipstick 09/05/2015 NEGATIVE  NEGATIVE Final  . Bilirubin Urine 09/05/2015 NEGATIVE  NEGATIVE Final  . Ketones, ur 09/05/2015 NEGATIVE  NEGATIVE mg/dL Final  . Protein, ur 09/05/2015 NEGATIVE  NEGATIVE mg/dL Final  . Urobilinogen, UA 09/05/2015 0.2  0.0 - 1.0 mg/dL Final  . Nitrite 09/05/2015 NEGATIVE  NEGATIVE Final  . Leukocytes, UA 09/05/2015 TRACE* NEGATIVE Final  . Squamous Epithelial / LPF 09/05/2015 RARE  RARE Final  . WBC, UA 09/05/2015 3-6  <3 WBC/hpf Final  . RBC / HPF 09/05/2015 0-2  <3 RBC/hpf Final  . Bacteria, UA 09/05/2015 RARE  RARE Final     X-Rays:Dg Pelvis Portable  09/13/2015  CLINICAL DATA:  Status post left hip replacement revision EXAM: PORTABLE PELVIS 1-2 VIEWS COMPARISON:  10/14/2014 FINDINGS: Interval revision of left hip arthroplasty femoral and acetabular components, which project in expected location. Metallic rod lateral to the midshaft of the femur with cerclage wires as before. Knee arthroplasty hardware is incompletely visualized. No fracture or dislocation. Bilateral pelvic phleboliths. IMPRESSION: 1. Left hip arthroplasty revision without apparent complication. Electronically Signed   By: Lucrezia Europe M.D.   On: 09/13/2015 17:33    EKG:No orders found for this or any previous visit.   Hospital Course: Patient was admitted to [facility] and taken to the OR and underwent the above state procedure without complications.  Patient tolerated the procedure well and was later transferred to the recovery room and then to the orthopaedic floor for postoperative care.  They were given PO and IV  analgesics for pain control following their surgery.  They were given 24 hours of postoperative antibiotics of  Anti-infectives    Start     Dose/Rate Route Frequency Ordered Stop   09/13/15 2000  ceFAZolin (ANCEF) IVPB 2 g/50 mL premix     2 g 100 mL/hr over 30 Minutes Intravenous Every 6 hours 09/13/15 1841 09/14/15 0239   09/13/15 1045  ceFAZolin (ANCEF) IVPB 2 g/50 mL premix     2 g 100 mL/hr  over 30 Minutes Intravenous On call to O.R. 09/13/15 1045 09/13/15 1346     and started on DVT prophylaxis in the form of Xarelto.   PT and OT were ordered for total hip protocol.  The patient was allowed to be WBAT with therapy. Discharge planning was consulted to help with postop disposition and equipment needs. Social worker consulted to assist with placement of the patient.  Patient had a decent night on the evening of surgery.  They started to get up OOB with therapy on day one.  She was only allowed to be TDWB to the left leg.  Hemovac drain was pulled without difficulty on day two.  The knee immobilizer was removed and discontinued on day two.  Continued to work with therapy into day two.  Dressing was changed on day two and the incision was healing well with no signs of cellulitits.  By day three, the patient had progressed with therapy and meeting their goals.  Incision was healing well.  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   Diet: Regular diet Activity:  Touch Weight Bearing to the Left Leg ONLY No bending hip over 90 degrees- A "L" Angle Do not cross legs Do not let foot roll inward When turning these patients a pillow should be placed between the patient's legs to prevent crossing. Patients should have the affected knee fully extended when trying to sit or stand from all surfaces to prevent excessive hip flexion. When ambulating and turning toward the affected side the affected leg should have the toes turned out prior to moving the  walker and the rest of patient's body as to prevent internal rotation/ turning in of the leg. Abduction pillows are the most effective way to prevent a patient from not crossing legs or turning toes in at rest. If an abduction pillow is not ordered placing a regular pillow length wise between the patient's legs is also an effective reminder. It is imperative that these precautions be maintained so that the surgical hip does not dislocate. Follow-up:in 2 weeks on next Thursday 09/26/2015 Disposition - Skilled nursing facility Discharged Condition: good   Discharge Instructions    Call MD / Call 911    Complete by:  As directed   If you experience chest pain or shortness of breath, CALL 911 and be transported to the hospital emergency room.  If you develope a fever above 101 F, pus (white drainage) or increased drainage or redness at the wound, or calf pain, call your surgeon's office.     Change dressing    Complete by:  As directed   You may change your dressing dressing daily with sterile 4 x 4 inch gauze dressing and paper tape.  Do not submerge the incision under water.     Constipation Prevention    Complete by:  As directed   Drink plenty of fluids.  Prune juice may be helpful.  You may use a stool softener, such as Colace (over the counter) 100 mg twice a day.  Use MiraLax (over the counter) for constipation as needed.     Diet general    Complete by:  As directed      Discharge instructions    Complete by:  As directed   Pick up stool softner and laxative for home use following surgery while on pain medications. Do not submerge incision under water. Please use good hand washing techniques while changing dressing  each day. May shower starting three days after surgery. Please use a clean towel to pat the incision dry following showers. Continue to use ice for pain and swelling after surgery. Do not use any lotions or creams on the incision until instructed by your surgeon. Hip  precautions.  Total Hip Protocol. Touch Down Weight Bearing to the LEFT LEG  Take Xarelto for two and a half more weeks, then discontinue Xarelto. Once the patient has completed the blood thinner regimen, then take a Baby 81 mg Aspirin daily for three more weeks.  Postoperative Constipation Protocol  Constipation - defined medically as fewer than three stools per week and severe constipation as less than one stool per week.  One of the most common issues patients have following surgery is constipation.  Even if you have a regular bowel pattern at home, your normal regimen is likely to be disrupted due to multiple reasons following surgery.  Combination of anesthesia, postoperative narcotics, change in appetite and fluid intake all can affect your bowels.  In order to avoid complications following surgery, here are some recommendations in order to help you during your recovery period.  Colace (docusate) - Pick up an over-the-counter form of Colace or another stool softener and take twice a day as long as you are requiring postoperative pain medications.  Take with a full glass of water daily.  If you experience loose stools or diarrhea, hold the colace until you stool forms back up.  If your symptoms do not get better within 1 week or if they get worse, check with your doctor.  Dulcolax (bisacodyl) - Pick up over-the-counter and take as directed by the product packaging as needed to assist with the movement of your bowels.  Take with a full glass of water.  Use this product as needed if not relieved by Colace only.   MiraLax (polyethylene glycol) - Pick up over-the-counter to have on hand.  MiraLax is a solution that will increase the amount of water in your bowels to assist with bowel movements.  Take as directed and can mix with a glass of water, juice, soda, coffee, or tea.  Take if you go more than two days without a movement. Do not use MiraLax more than once per day. Call your doctor if you  are still constipated or irregular after using this medication for 7 days in a row.  If you continue to have problems with postoperative constipation, please contact the office for further assistance and recommendations.  If you experience "the worst abdominal pain ever" or develop nausea or vomiting, please contact the office immediatly for further recommendations for treatment.  When discharged from the skilled rehab facility, please have the facility set up the patient's [place] prior to being released.  Please make sure this gets set up prior to release in order to avoid any lapse of therapy following the rehab stay.  Also provide the patient with their medications at time of release from the facility to include their pain medication, the muscle relaxants, and their blood thinner medication.  If the patient is still at the rehab facility at time of follow up appointment, please also assist the patient in arranging follow up appointment in our office and any transportation needs. ICE to the affected knee or hip every three hours for 30 minutes at a time and then as needed for pain and swelling.     Do not sit on low chairs, stoools or toilet seats, as it  may be difficult to get up from low surfaces    Complete by:  As directed      Driving restrictions    Complete by:  As directed   No driving until released by the physician.     Follow the hip precautions as taught in Physical Therapy    Complete by:  As directed      Increase activity slowly as tolerated    Complete by:  As directed      Lifting restrictions    Complete by:  As directed   No lifting until released by the physician.     Patient may shower    Complete by:  As directed   You may shower without a dressing once there is no drainage.  Do not wash over the wound.  If drainage remains, do not shower until drainage stops.     TED hose    Complete by:  As directed   Use stockings (TED hose) for 3 weeks on both  leg(s).  You may remove them at night for sleeping.     Touch down weight bearing    Complete by:  As directed   Laterality:  left  Extremity:  Lower            Medication List    STOP taking these medications        b complex vitamins tablet     CALCIUM MAGNESIUM PO     feeding supplement (PRO-STAT SUGAR FREE 64) Liqd     Fish Oil 1000 MG Caps     ibuprofen 200 MG tablet  Commonly known as:  ADVIL,MOTRIN     meloxicam 7.5 MG tablet  Commonly known as:  MOBIC     multivitamin tablet     tiZANidine 4 MG tablet  Commonly known as:  ZANAFLEX     vitamin C 100 MG tablet     vitamin D (CHOLECALCIFEROL) 400 UNITS tablet     VITAMIN D-3 PO      TAKE these medications        acetaminophen 325 MG tablet  Commonly known as:  TYLENOL  Take 2 tablets (650 mg total) by mouth every 6 (six) hours as needed for mild pain (or Fever >/= 101).     albuterol 108 (90 BASE) MCG/ACT inhaler  Commonly known as:  PROVENTIL HFA;VENTOLIN HFA  Inhale into the lungs.     bisacodyl 10 MG suppository  Commonly known as:  DULCOLAX  Place 1 suppository (10 mg total) rectally daily as needed for moderate constipation.     budesonide-formoterol 160-4.5 MCG/ACT inhaler  Commonly known as:  SYMBICORT  Inhale 1-2 puffs into the lungs 2 (two) times daily.     clotrimazole 1 % cream  Commonly known as:  LOTRIMIN  Apply 1 application topically 2 (two) times daily. To area left upper thigh     clotrimazole-betamethasone cream  Commonly known as:  LOTRISONE  Apply 1 application topically 2 (two) times daily as needed (rash in skin folds).     diphenhydrAMINE 12.5 MG/5ML elixir  Commonly known as:  BENADRYL  Take 5-10 mLs (12.5-25 mg total) by mouth every 4 (four) hours as needed for itching.     docusate sodium 100 MG capsule  Commonly known as:  COLACE  Take 1 capsule (100 mg total) by mouth 2 (two) times daily.     ferrous sulfate 325 (65 FE) MG EC tablet  Take 325 mg by mouth 3  (three)  times a week.     furosemide 40 MG tablet  Commonly known as:  LASIX  Take 40 mg by mouth 2 (two) times daily as needed (fluid retention).     gabapentin 400 MG capsule  Commonly known as:  NEURONTIN  Take 2 capsules (800 mg total) by mouth 2 (two) times daily.     hydrOXYzine 25 MG tablet  Commonly known as:  ATARAX/VISTARIL  Take 50 mg by mouth every 6 (six) hours as needed for anxiety (anxiety).     methocarbamol 500 MG tablet  Commonly known as:  ROBAXIN  Take 1 tablet (500 mg total) by mouth every 6 (six) hours as needed for muscle spasms.     metoCLOPramide 5 MG tablet  Commonly known as:  REGLAN  Take 1-2 tablets (5-10 mg total) by mouth every 8 (eight) hours as needed for nausea (if ondansetron (ZOFRAN) ineffective.).     morphine 15 MG tablet  Commonly known as:  MSIR  Take 1 tablet (15 mg total) by mouth every 8 (eight) hours as needed for severe pain.     MOVANTIK PO  Take 25 mg by mouth at bedtime.     omeprazole 40 MG capsule  Commonly known as:  PRILOSEC  Take 40 mg by mouth daily.     ondansetron 4 MG tablet  Commonly known as:  ZOFRAN  Take 1 tablet (4 mg total) by mouth every 6 (six) hours as needed for nausea.     oxybutynin 5 MG tablet  Commonly known as:  DITROPAN  Take 5 mg by mouth 3 (three) times daily.     oxyCODONE 5 MG immediate release tablet  Commonly known as:  Oxy IR/ROXICODONE  Take 1-2 tablets (5-10 mg total) by mouth every 3 (three) hours as needed for moderate pain or severe pain.     oxymorphone 10 MG 12 hr tablet  Commonly known as:  OPANA ER  Take 1 tablet (10 mg total) by mouth every 12 (twelve) hours.     oxymorphone 20 MG 12 hr tablet  Commonly known as:  OPANA ER  Take 1 tablet (20 mg total) by mouth 3 (three) times daily.     polyethylene glycol packet  Commonly known as:  MIRALAX / GLYCOLAX  Take 17 g by mouth daily as needed for mild constipation.     POTASSIUM PO  Take 1 tablet by mouth at bedtime.      promethazine 12.5 MG tablet  Commonly known as:  PHENERGAN  Take 1 tablet (12.5 mg total) by mouth every 6 (six) hours as needed for nausea or vomiting.     rivaroxaban 10 MG Tabs tablet  Commonly known as:  XARELTO  Take 1 tablet (10 mg total) by mouth daily with breakfast. Take Xarelto for two and a half more weeks, then discontinue Xarelto. Once the patient has completed the blood thinner regimen, then take a Baby 81 mg Aspirin daily for three more weeks.     sodium chloride 0.65 % Soln nasal spray  Commonly known as:  OCEAN  Place 2 sprays into both nostrils daily as needed for congestion (dryness).     sodium phosphate 7-19 GM/118ML Enem  Place 133 mLs (1 enema total) rectally once as needed for severe constipation.     SUMAtriptan 100 MG tablet  Commonly known as:  IMITREX  Take 100 mg by mouth once. May repeat in 2 hours if headache persists or recurs. NO MORE THAN 2 IN A 24  HOUR PERIOD     traMADol 50 MG tablet  Commonly known as:  ULTRAM  Take 1-2 tablets (50-100 mg total) by mouth every 6 (six) hours as needed (mild pain).     traZODone 150 MG tablet  Commonly known as:  DESYREL  Take 150-300 mg by mouth at bedtime as needed for sleep.     triamcinolone cream 0.1 %  Commonly known as:  KENALOG  Apply 1 application topically 2 (two) times daily as needed (irritation). CALF OF BOTH LEGS - BECAUSE OF SKIN IRRITATION THOUGHT TO BE DUE TO SWELLING OF LEGS           Follow-up Information    Follow up with Gearlean Alf, MD. Schedule an appointment as soon as possible for a visit on 09/26/2015.   Specialty:  Orthopedic Surgery   Why:  Call office ASAP at [phone] to setup appointment with Dr. Cristy Hilts on Thursday 09/26/2015.   Contact information:   [address] [place] [address] [phone]       Signed: Arlee Muslim, PA-C Orthopaedic Surgery 09/16/2015, 7:34 AM

## 2015-09-17 ENCOUNTER — Encounter (HOSPITAL_COMMUNITY): Payer: Self-pay | Admitting: Orthopedic Surgery

## 2015-09-17 LAB — TYPE AND SCREEN
ABO/RH(D): O POS
Antibody Screen: POSITIVE
DONOR AG TYPE: NEGATIVE
DONOR AG TYPE: NEGATIVE
Donor AG Type: NEGATIVE
UNIT DIVISION: 0
UNIT DIVISION: 0
Unit division: 0

## 2015-09-18 LAB — ANAEROBIC CULTURE

## 2015-09-19 ENCOUNTER — Non-Acute Institutional Stay (SKILLED_NURSING_FACILITY): Payer: Medicare Other | Admitting: Internal Medicine

## 2015-09-19 DIAGNOSIS — K5903 Drug induced constipation: Secondary | ICD-10-CM | POA: Diagnosis not present

## 2015-09-19 DIAGNOSIS — M792 Neuralgia and neuritis, unspecified: Secondary | ICD-10-CM | POA: Diagnosis not present

## 2015-09-19 DIAGNOSIS — K2100 Gastro-esophageal reflux disease with esophagitis, without bleeding: Secondary | ICD-10-CM

## 2015-09-19 DIAGNOSIS — R600 Localized edema: Secondary | ICD-10-CM

## 2015-09-19 DIAGNOSIS — F17200 Nicotine dependence, unspecified, uncomplicated: Secondary | ICD-10-CM | POA: Diagnosis not present

## 2015-09-19 DIAGNOSIS — M62830 Muscle spasm of back: Secondary | ICD-10-CM | POA: Diagnosis not present

## 2015-09-19 DIAGNOSIS — T402X5A Adverse effect of other opioids, initial encounter: Secondary | ICD-10-CM

## 2015-09-19 DIAGNOSIS — J438 Other emphysema: Secondary | ICD-10-CM

## 2015-09-19 DIAGNOSIS — N3281 Overactive bladder: Secondary | ICD-10-CM

## 2015-09-19 DIAGNOSIS — R2681 Unsteadiness on feet: Secondary | ICD-10-CM

## 2015-09-19 DIAGNOSIS — D62 Acute posthemorrhagic anemia: Secondary | ICD-10-CM

## 2015-09-19 DIAGNOSIS — T84098S Other mechanical complication of other internal joint prosthesis, sequela: Secondary | ICD-10-CM

## 2015-09-19 DIAGNOSIS — Z96649 Presence of unspecified artificial hip joint: Secondary | ICD-10-CM

## 2015-09-19 DIAGNOSIS — T84018S Broken internal joint prosthesis, other site, sequela: Secondary | ICD-10-CM

## 2015-09-19 NOTE — Progress Notes (Signed)
Patient ID: [name], female   DOB: 06/22/1956, 59 y.o.   MRN: [id number]     Facility: [facility]    PCP: Carolynn Serve, MD  Code Status: full code  Allergies  Allergen Reactions  . Tomato Nausea And Vomiting    Other reaction(s): Other (See Comments) GI Upset  . Vancomycin Other (See Comments)    LEUKOPENIA    Chief Complaint  Patient presents with  . New Admit To SNF     HPI:  59 y.o. patient is here for short term rehabilitation post hospital admission from 09/13/15-09/16/15 post total hip arthroplasty reimplantation of left hip. She is seen in her room today. She mentions that her pain is not under control with current pain regimen. She also complaints of muscle tightness to her right upper back area. She has been working with therapy team but mentions that pain limits her participation with therapy. She has history of chronic pain and has been followed by pain clinic is [place]. She does not remember name of clinic or her provider's name but is able to provide contact information for the clinic.   Review of Systems:  Constitutional: positive for weakness. Negative for fever, chills, diaphoresis.  HENT: Negative for headache, congestion, nasal discharge, sore throat, difficulty swallowing.   Eyes: Negative for eye pain, blurred vision, double vision and discharge.  Respiratory: positive for occasional cough. Active smoker. Negative for shortness of breath and wheezing.   Cardiovascular: Negative for chest pain, palpitations, leg swelling.  Gastrointestinal: Negative for heartburn, vomiting, abdominal pain. Has nausea when in severe pain. Appetite is fair. Had bowel movement this am. Genitourinary: Negative for dysuria and flank pain.  Musculoskeletal: Negative for falls in the facility. Positive for back and left hip area pain Skin: Negative for itching, rash.  Neurological: positive for dizziness with sudden position change. Negative for   tingling, focal weakness Psychiatric/Behavioral: Negative for depression.    Past Medical History  Diagnosis Date  . Tenosynovitis of fingers     RIGHT RING AND SMALL-HAD SURGERY 07/12/14 [facility]  . Bronchitis     dx  07-04-2014 (approx) will finished antibiotic 07-14-2014  . History of concussion     x4  --  no residual  . Migraines   . Depression   . 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 GERD (gastroesophageal reflux disease)   . H/O hiatal hernia   . Chronic constipation   . Chronic narcotic use   . Nocturia   . SUI (stress urinary incontinence, female)   . OA (osteoarthritis)     hips, knees, hips  . 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 Full dentures   . History of cellulitis     lower extremities  . Thinning of skin     easily tears  . Venous stasis dermatitis     BILATERAL LEGS  . Productive cough     RESOLVED - QUIT SMOKING  . Memory problem   . Transfusion history   . OSA on CPAP     study x3   last one few yrs ago  cpap recommended -- DOES NOT USE OR HAVE CPAP.09-05-15 pt states"being evaluated-no cpap machine yet".   Past Surgical History  Procedure Laterality Date  . Appendectomy  39 (age 51)  . 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 Orif left foot fx  1990's  . Orif right tibial fx  2008    hardware removed same year  . Open repair and fixation right tibia and knee  X2  2009  . Total knee arthroplasty Right X2  2009  &  2010  . Total knee arthroplasty Left 2009  . Knee arthroscopy Left 2009  . Orif pelvic fx's  11 /2011  . Total hip arthroplasty Left 2012  . Revision total hip arthroplasty Left 01/ 2015  . Removal prothesis  left hip arthroplasty  02/ 2015    INFECTION  . Laparoscopic  cholecystectomy  08-12-2003  . Tenosynovectomy Right 07/12/2014    Procedure: RIGHT RING/SMALL FINGER TENOSYNOVECTOMY;  Surgeon: Sharma Covert, MD;  Location: [facility];  Service: Orthopedics;  Laterality: Right;  . Total hip revision Left 08/24/2014    Procedure: INCISION AND DRAINAGE LEFT HIP, EXCHANGE ANTIBIOTIC SPACER, OPEN REDUCTION LEFT FEMUR FRACTURE ;  Surgeon: Loanne Drilling, MD;  Location: [facility];  Service: Orthopedics;  Laterality: Left;  . Total hip revision Left 10/14/2014    Procedure: PARTIAL HIP REVISION;  Surgeon: Shelda Pal, MD;  Location: [facility];  Service: Orthopedics;  Laterality: Left;  . Reimplantation of cement spacer hip Left 11/17/2014    Procedure: IRRIGATION AND DEBRIDEMENT AND REIMPLANTATION OF CEMENT SPACER HIP;  Surgeon: Loanne Drilling, MD;  Location: [facility];  Service: Orthopedics;  Laterality: Left;  . Reimplantation of total hip Left 09/13/2015    Procedure: REIMPLANTATION OF TOTAL  LEFT HIP ARTHOPLASTY;  Surgeon: Ollen Gross, MD;  Location: [facility];  Service: Orthopedics;  Laterality: Left;   Social History:   reports that she quit smoking about 14 months ago. Her smoking use included Cigarettes. She has a 30 pack-year smoking history. She has never used smokeless tobacco. She reports that she does not drink alcohol or use illicit drugs.  Family History  Problem Relation Age of Onset  . COPD Mother   . Hypertension Father   . Pancreatic cancer Father   . COPD Brother     Medications:   Medication List       This list is accurate as of: 09/19/15  1:13 PM.  Always use your most recent med list.               acetaminophen 325 MG tablet  Commonly known as:  TYLENOL  Take 2 tablets (650 mg total) by mouth every 6 (six) hours as needed for mild pain (or Fever >/= 101).     albuterol 108 (90 BASE) MCG/ACT inhaler  Commonly known as:  PROVENTIL HFA;VENTOLIN HFA  Inhale into the lungs.     bisacodyl 10 MG suppository  Commonly  known as:  DULCOLAX  Place 1 suppository (10 mg total) rectally daily as needed for moderate constipation.     budesonide-formoterol 160-4.5 MCG/ACT inhaler  Commonly known as:  SYMBICORT  Inhale 1-2 puffs into the lungs 2 (two) times daily.     clotrimazole 1 % cream  Commonly known as:  LOTRIMIN  Apply 1 application topically 2 (two) times daily. To area left upper thigh     clotrimazole-betamethasone cream  Commonly known as:  LOTRISONE  Apply 1 application topically 2 (two) times daily as needed (rash in skin folds).  diphenhydrAMINE 12.5 MG/5ML elixir  Commonly known as:  BENADRYL  Take 5-10 mLs (12.5-25 mg total) by mouth every 4 (four) hours as needed for itching.     docusate sodium 100 MG capsule  Commonly known as:  COLACE  Take 1 capsule (100 mg total) by mouth 2 (two) times daily.     ferrous sulfate 325 (65 FE) MG EC tablet  Take 325 mg by mouth 3 (three) times a week.     furosemide 40 MG tablet  Commonly known as:  LASIX  Take 40 mg by mouth 2 (two) times daily as needed (fluid retention).     gabapentin 400 MG capsule  Commonly known as:  NEURONTIN  Take 2 capsules (800 mg total) by mouth 2 (two) times daily.     hydrOXYzine 25 MG tablet  Commonly known as:  ATARAX/VISTARIL  Take 50 mg by mouth every 6 (six) hours as needed for anxiety (anxiety).     methocarbamol 500 MG tablet  Commonly known as:  ROBAXIN  Take 1 tablet (500 mg total) by mouth every 6 (six) hours as needed for muscle spasms.     metoCLOPramide 5 MG tablet  Commonly known as:  REGLAN  Take 1-2 tablets (5-10 mg total) by mouth every 8 (eight) hours as needed for nausea (if ondansetron (ZOFRAN) ineffective.).     morphine 15 MG tablet  Commonly known as:  MSIR  Take 1 tablet (15 mg total) by mouth every 8 (eight) hours as needed for severe pain.     MOVANTIK PO  Take 25 mg by mouth at bedtime.     omeprazole 40 MG capsule  Commonly known as:  PRILOSEC  Take 40 mg by mouth daily.      ondansetron 4 MG tablet  Commonly known as:  ZOFRAN  Take 1 tablet (4 mg total) by mouth every 6 (six) hours as needed for nausea.     oxybutynin 5 MG tablet  Commonly known as:  DITROPAN  Take 5 mg by mouth 3 (three) times daily.     oxyCODONE 5 MG immediate release tablet  Commonly known as:  Oxy IR/ROXICODONE  Take 1-2 tablets (5-10 mg total) by mouth every 3 (three) hours as needed for moderate pain or severe pain.     oxymorphone 10 MG 12 hr tablet  Commonly known as:  OPANA ER  Take 1 tablet (10 mg total) by mouth every 12 (twelve) hours.     polyethylene glycol packet  Commonly known as:  MIRALAX / GLYCOLAX  Take 17 g by mouth daily as needed for mild constipation.     POTASSIUM PO  Take 1 tablet by mouth at bedtime.     promethazine 12.5 MG tablet  Commonly known as:  PHENERGAN  Take 1 tablet (12.5 mg total) by mouth every 6 (six) hours as needed for nausea or vomiting.     rivaroxaban 10 MG Tabs tablet  Commonly known as:  XARELTO  Take 1 tablet (10 mg total) by mouth daily with breakfast. Take Xarelto for two and a half more weeks, then discontinue Xarelto. Once the patient has completed the blood thinner regimen, then take a Baby 81 mg Aspirin daily for three more weeks.     sodium chloride 0.65 % Soln nasal spray  Commonly known as:  OCEAN  Place 2 sprays into both nostrils daily as needed for congestion (dryness).     sodium phosphate 7-19 GM/118ML Enem  Place 133 mLs (1 enema total)  rectally once as needed for severe constipation.     SUMAtriptan 100 MG tablet  Commonly known as:  IMITREX  Take 100 mg by mouth once. May repeat in 2 hours if headache persists or recurs. NO MORE THAN 2 IN A 24 HOUR PERIOD     traZODone 150 MG tablet  Commonly known as:  DESYREL  Take 150-300 mg by mouth at bedtime as needed for sleep.     triamcinolone cream 0.1 %  Commonly known as:  KENALOG  Apply 1 application topically 2 (two) times daily as needed (irritation).  CALF OF BOTH LEGS - BECAUSE OF SKIN IRRITATION THOUGHT TO BE DUE TO SWELLING OF LEGS         Physical Exam: Filed Vitals:   09/19/15 0953  BP: 143/63  Pulse: 85  Temp: 98.5 F (36.9 C)  Resp: 18  SpO2: 98%    General- elderly female, well built, in no acute distress Head- normocephalic, atraumatic Nose- normal nasal mucosa, no maxillary or frontal sinus tenderness, no nasal discharge Throat- moist mucus membrane  Eyes- PERRLA, EOMI, no pallor, no icterus, no discharge, normal conjunctiva, normal sclera Neck- no cervical lymphadenopathy Cardiovascular- normal s1,s2, no murmurs, trace left leg edema Respiratory- bilateral clear to auscultation, no wheeze, no rhonchi, no crackles, no use of accessory muscles Abdomen- bowel sounds present, soft, non tender Musculoskeletal- able to move all 4 extremities, left hip ROM limited, on motorized wheelchair, left upper back area muscle spasm +, no spinal tenderness Neurological- no focal deficit, alert and oriented to person, place and time Skin- warm and dry, Surgical incision with staples in place, minimal bleed at mid area with some serous drainage in this area, no drainage otherwise, minimal peri-incisional erythema, no induration or warmth noted Psychiatry- normal mood and affect    Labs reviewed: Basic Metabolic Panel:  Recent Labs  16/10/96 1530 09/14/15 0339 09/15/15 0550  NA 138 139 142  K 4.2 4.3 4.3  CL 103 107 108  CO2 29 24 28   GLUCOSE 119* 121* 109*  BUN 11 9 11   CREATININE 0.66 0.65 0.61  CALCIUM 9.6 8.4* 8.7*   Liver Function Tests:  Recent Labs  11/15/14 1913 11/16/14 0520 09/05/15 1530  AST 24 22 26   ALT 11 10 17   ALKPHOS 110 88 93  BILITOT 0.2* 0.3 0.8  PROT 7.6 6.3 7.3  ALBUMIN 2.8* 2.4* 4.0   No results for input(s): LIPASE, AMYLASE in the last 8760 hours. No results for input(s): AMMONIA in the last 8760 hours. CBC:  Recent Labs  11/15/14 1913  09/14/15 0339 09/15/15 0550  09/16/15 0505  WBC 2.3*  < > 9.4 5.5 6.2  NEUTROABS 1.6*  --   --   --   --   HGB 9.9*  < > 11.2* 8.9* 9.0*  HCT 30.2*  < > 32.2* 25.8* 26.7*  MCV 91.8  < > 87.5 89.0 89.9  PLT 138*  < > 179 90* 103*  < > = values in this interval not displayed. Cardiac Enzymes:  Recent Labs  10/15/14 0519  CKTOTAL 112   BNP: Invalid input(s): POCBNP CBG:  Recent Labs  10/13/14 0823 10/15/14 0723  GLUCAP 136* 129*     Assessment/Plan  Unsteady gait Will have patient work with PT/OT as tolerated to regain strength and restore function.  Fall precautions are in place.  Left hip OA S/p left total hip arthroplasty reimplantation. Her pain is not under control with current pain regimen. Change opana 10 mg  ER BID to three times a day for now. Continue mrphine IR 15 mg q8h prn, tramadol 50 mg 1-2 tab q6h prn  and oxyIR 5 mg 1-2 tab q3h prn for breakthrough pain. Continue robaxin 500 mg q6h prn muscle spasm. Has follow up with orthopedics. Will have her work with physical therapy and occupational therapy team to help with gait training and muscle strengthening exercises.fall precautions. Skin care. Encourage to be out of bed. Will need records from her pain clinic- telephone number documented in facility chart. Continue xarelto for dvt prophylaxis. tocuh down weight bearing only to LLE for now.   Left upper back spasm add baclofen 5 mg bid with Heat pack and biofreeze bid with therapy and monitor  Blood loss anemia Monitor h&h, continue ferrous sulfate 325 mg three times a week for now. On bowel regimen to prevent constipation, monitor cbc  Leg edema Stable, continue lasix 40 mg bid prn and monitor. On kcl supplement, monitor bmp  Neuropathic pain Continue gabapentin 800 mg bid, requests for 2400 mg total in a day and mentions taking this dose at home. Will get records from pain clinic to review medication before making changes  Emphysema Continues to smoke. Continue symbicort bid with prn  proventil  Tobacco use Continues to smoke, counselling provided, patient not willing to quit at present  Opioid induced Constipation Continue movantik 25 mg qhs, colace 100 mg bid, miralax daily prn and dulcolax suppository daily prn  gerd Stable, continue prilosec 40 mg daily and prn zofran for nausea  OAB Continue oxybutynin current regimen, no changes made  Goals of care: short term rehabilitation   Labs/tests ordered: cbc with diff, cmp 09/20/15  Family/ staff Communication: reviewed care plan with patient and nursing supervisor    Oneal GroutMAHIMA Gretna Bergin, MD  [facility] [phone] (Monday-Friday 8 am - 5 pm) [phone] (afterhours)

## 2015-09-21 ENCOUNTER — Emergency Department (HOSPITAL_COMMUNITY)
Admission: EM | Admit: 2015-09-21 | Discharge: 2015-09-21 | Disposition: A | Discharge status: Home / Self Care | Payer: Medicare Other | Attending: Emergency Medicine | Admitting: Emergency Medicine

## 2015-09-21 ENCOUNTER — Encounter (HOSPITAL_COMMUNITY): Payer: Self-pay | Admitting: Emergency Medicine

## 2015-09-21 DIAGNOSIS — Y658 Other specified misadventures during surgical and medical care: Secondary | ICD-10-CM | POA: Insufficient documentation

## 2015-09-21 DIAGNOSIS — R600 Localized edema: Secondary | ICD-10-CM | POA: Insufficient documentation

## 2015-09-21 DIAGNOSIS — M9689 Other intraoperative and postprocedural complications and disorders of the musculoskeletal system: Secondary | ICD-10-CM | POA: Diagnosis not present

## 2015-09-21 DIAGNOSIS — F17200 Nicotine dependence, unspecified, uncomplicated: Secondary | ICD-10-CM | POA: Insufficient documentation

## 2015-09-21 DIAGNOSIS — Z87891 Personal history of nicotine dependence: Secondary | ICD-10-CM | POA: Diagnosis not present

## 2015-09-21 DIAGNOSIS — F329 Major depressive disorder, single episode, unspecified: Secondary | ICD-10-CM | POA: Diagnosis not present

## 2015-09-21 DIAGNOSIS — Z7901 Long term (current) use of anticoagulants: Secondary | ICD-10-CM | POA: Diagnosis not present

## 2015-09-21 DIAGNOSIS — M158 Other polyosteoarthritis: Secondary | ICD-10-CM | POA: Diagnosis not present

## 2015-09-21 DIAGNOSIS — Z8709 Personal history of other diseases of the respiratory system: Secondary | ICD-10-CM | POA: Insufficient documentation

## 2015-09-21 DIAGNOSIS — G43909 Migraine, unspecified, not intractable, without status migrainosus: Secondary | ICD-10-CM | POA: Diagnosis not present

## 2015-09-21 DIAGNOSIS — Z96642 Presence of left artificial hip joint: Secondary | ICD-10-CM

## 2015-09-21 DIAGNOSIS — Z8679 Personal history of other diseases of the circulatory system: Secondary | ICD-10-CM | POA: Insufficient documentation

## 2015-09-21 DIAGNOSIS — Z9981 Dependence on supplemental oxygen: Secondary | ICD-10-CM | POA: Diagnosis not present

## 2015-09-21 DIAGNOSIS — Z872 Personal history of diseases of the skin and subcutaneous tissue: Secondary | ICD-10-CM | POA: Diagnosis not present

## 2015-09-21 DIAGNOSIS — Z8742 Personal history of other diseases of the female genital tract: Secondary | ICD-10-CM | POA: Diagnosis not present

## 2015-09-21 DIAGNOSIS — Z9889 Other specified postprocedural states: Secondary | ICD-10-CM | POA: Insufficient documentation

## 2015-09-21 DIAGNOSIS — G4733 Obstructive sleep apnea (adult) (pediatric): Secondary | ICD-10-CM | POA: Diagnosis not present

## 2015-09-21 DIAGNOSIS — Z79899 Other long term (current) drug therapy: Secondary | ICD-10-CM | POA: Diagnosis not present

## 2015-09-21 DIAGNOSIS — Z7951 Long term (current) use of inhaled steroids: Secondary | ICD-10-CM | POA: Diagnosis not present

## 2015-09-21 LAB — BASIC METABOLIC PANEL
Anion gap: 4 — ABNORMAL LOW (ref 5–15)
BUN: 9 mg/dL (ref 6–20)
CALCIUM: 8.9 mg/dL (ref 8.9–10.3)
CO2: 33 mmol/L — AB (ref 22–32)
Chloride: 100 mmol/L — ABNORMAL LOW (ref 101–111)
Creatinine, Ser: 0.67 mg/dL (ref 0.44–1.00)
GFR calc Af Amer: >60 mL/min (ref >60)
GFR calc non Af Amer: >60 mL/min (ref >60)
GLUCOSE: 115 mg/dL — AB (ref 65–99)
Potassium: 4.1 mmol/L (ref 3.5–5.1)
Sodium: 137 mmol/L (ref 135–145)

## 2015-09-21 LAB — CBC WITH DIFFERENTIAL/PLATELET
BASOS PCT: 0 %
Basophils Absolute: 0 10*3/uL (ref 0.0–0.1)
Eosinophils Absolute: 0.2 10*3/uL (ref 0.0–0.7)
Eosinophils Relative: 3 %
HEMATOCRIT: 25.3 % — AB (ref 36.0–46.0)
Hemoglobin: 8.5 g/dL — ABNORMAL LOW (ref 12.0–15.0)
LYMPHS ABS: 0.9 10*3/uL (ref 0.7–4.0)
LYMPHS PCT: 16 %
MCH: 30.6 pg (ref 26.0–34.0)
MCHC: 33.6 g/dL (ref 30.0–36.0)
MCV: 91 fL (ref 78.0–100.0)
MONO ABS: 0.5 10*3/uL (ref 0.1–1.0)
MONOS PCT: 8 %
NEUTROS ABS: 4.4 10*3/uL (ref 1.7–7.7)
Neutrophils Relative %: 73 %
Platelets: 190 10*3/uL (ref 150–400)
RBC: 2.78 MIL/uL — ABNORMAL LOW (ref 3.87–5.11)
RDW: 14 % (ref 11.5–15.5)
WBC: 6 10*3/uL (ref 4.0–10.5)

## 2015-09-21 MED ORDER — DOXYCYCLINE HYCLATE 100 MG PO CAPS: 100.0000 mg | ORAL_CAPSULE | Freq: Two times a day (BID) | ORAL | Status: AC

## 2015-09-21 NOTE — ED Provider Notes (Signed)
CSN: [id number]     Arrival date & time 09/21/15  1438 History   First MD Initiated Contact with Patient 09/21/15 1506     Chief Complaint  Patient presents with  . Drainage from Incision     (Consider location/radiation/quality/duration/timing/severity/associated sxs/prior Treatment) HPI   The 59 year old female who presents to the ED via EMS with complaint of drainage from her incision site. Patient reports having left hip replacement on 09/13/15 and notes she was discharged to [facility] on 09/16/15. Patient reports surgery was without any complications. She notes when she woke up yesterday morning she noticed a large area of red/pink drainage on her sheets under her left hip. Denies any fever, changes in pain, redness, numbness tingling weakness or swelling. She reports her bandage was changed by a nurse at the facility and then notes waking up this morning and having a smaller amount of pink drainage on her sheets under her left hip. She reports the nursing staff called their physician and was advised to come to the ED for evaluation. Patient denies any other complaints at this time. She is not currently on any antibiotics.  Past Medical History  Diagnosis Date  . Tenosynovitis of fingers     RIGHT RING AND SMALL-HAD SURGERY 07/12/14 [facility]  . Bronchitis     dx  07-04-2014 (approx) will finished antibiotic 07-14-2014  . History of concussion     x4  --  no residual  . Migraines   . Depression   . 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 GERD (gastroesophageal reflux disease)   . H/O hiatal hernia   . Chronic constipation   . Chronic narcotic use   . Nocturia   . SUI (stress urinary incontinence, female)   . OA (osteoarthritis)     hips, knees, hips  . 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 Full  dentures   . History of cellulitis     lower extremities  . Thinning of skin     easily tears  . Venous stasis dermatitis     BILATERAL LEGS  . Productive cough     RESOLVED - QUIT SMOKING  . Memory problem   . Transfusion history   . OSA on CPAP     study x3   last one few yrs ago  cpap recommended -- DOES NOT USE OR HAVE CPAP.09-05-15 pt states"being evaluated-no cpap machine yet".   Past Surgical History  Procedure Laterality Date  . Appendectomy  [id number] (age 59)  . 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 Orif left foot fx  1990's  . Orif right tibial fx  2008    hardware removed same year  . Open repair and fixation right tibia and knee  X2  2009  . Total knee arthroplasty Right X2  2009  &  2010  . Total knee arthroplasty Left 2009  . Knee arthroscopy Left 2009  . Orif pelvic fx's  11 /2011  . Total hip arthroplasty Left 2012  . Revision total hip arthroplasty Left 01/ 2015  . Removal prothesis  left hip arthroplasty  02/ 2015    INFECTION  .  Laparoscopic cholecystectomy  08-12-2003  . Tenosynovectomy Right 07/12/2014    Procedure: RIGHT RING/SMALL FINGER TENOSYNOVECTOMY;  Surgeon: Sharma Covert, MD;  Location: [facility];  Service: Orthopedics;  Laterality: Right;  . Total hip revision Left 08/24/2014    Procedure: INCISION AND DRAINAGE LEFT HIP, EXCHANGE ANTIBIOTIC SPACER, OPEN REDUCTION LEFT FEMUR FRACTURE ;  Surgeon: Loanne Drilling, MD;  Location: [facility];  Service: Orthopedics;  Laterality: Left;  . Total hip revision Left 10/14/2014    Procedure: PARTIAL HIP REVISION;  Surgeon: Shelda Pal, MD;  Location: [facility];  Service: Orthopedics;  Laterality: Left;  . Reimplantation of cement spacer hip Left 11/17/2014    Procedure: IRRIGATION AND DEBRIDEMENT AND REIMPLANTATION OF CEMENT SPACER HIP;  Surgeon: Loanne Drilling, MD;  Location: [facility];  Service: Orthopedics;   Laterality: Left;  . Reimplantation of total hip Left 09/13/2015    Procedure: REIMPLANTATION OF TOTAL  LEFT HIP ARTHOPLASTY;  Surgeon: Ollen Gross, MD;  Location: [facility];  Service: Orthopedics;  Laterality: Left;   Family History  Problem Relation Age of Onset  . COPD Mother   . Hypertension Father   . Pancreatic cancer Father   . COPD Brother    Social History  Substance Use Topics  . Smoking status: Former Smoker -- 1.00 packs/day for 30 years    Types: Cigarettes    Quit date: 07/09/2014  . Smokeless tobacco: Never Used     Comment: PT STATES ATTEMPTING TO QUIT SMOKING 07-09-2014--  USING NICOTIN LOZENGERS  . Alcohol Use: No   OB History    No data available     Review of Systems  Skin: Positive for wound (drainage).  All other systems reviewed and are negative.     Allergies  Tomato and Vancomycin  Home Medications   Prior to Admission medications   Medication Sig Start Date End Date Taking? Authorizing Provider  acetaminophen (TYLENOL) 325 MG tablet Take 2 tablets (650 mg total) by mouth every 6 (six) hours as needed for mild pain (or Fever >/= 101). 08/29/14  Yes Avel Peace, PA-C  albuterol (PROVENTIL HFA;VENTOLIN HFA) 108 (90 BASE) MCG/ACT inhaler Inhale into the lungs.   Yes Historical Provider, MD  baclofen (LIORESAL) 10 MG tablet Take 5 mg by mouth 3 (three) times daily.   Yes Historical Provider, MD  bisacodyl (DULCOLAX) 10 MG suppository Place 1 suppository (10 mg total) rectally daily as needed for moderate constipation. 09/16/15  Yes Avel Peace, PA-C  budesonide-formoterol (SYMBICORT) 160-4.5 MCG/ACT inhaler Inhale 1-2 puffs into the lungs 2 (two) times daily.   Yes Historical Provider, MD  calcium-vitamin D (CALCIUM 500/D) 500-200 MG-UNIT tablet Take 1 tablet by mouth 3 (three) times daily.   Yes Historical Provider, MD  clotrimazole (LOTRIMIN) 1 % cream Apply 1 application topically 2 (two) times daily. To area left upper thigh   Yes Historical  Provider, MD  clotrimazole-betamethasone (LOTRISONE) cream Apply 1 application topically 2 (two) times daily as needed (rash in skin folds).   Yes Historical Provider, MD  diphenhydrAMINE (BENADRYL) 12.5 MG/5ML elixir Take 5-10 mLs (12.5-25 mg total) by mouth every 4 (four) hours as needed for itching. 09/16/15  Yes Avel Peace, PA-C  docusate sodium (COLACE) 100 MG capsule Take 1 capsule (100 mg total) by mouth 2 (two) times daily. 09/16/15  Yes Avel Peace, PA-C  ferrous sulfate 325 (65 FE) MG EC tablet Take 325 mg by mouth 3 (three) times a week.    Yes Historical Provider,  MD  furosemide (LASIX) 40 MG tablet Take 40 mg by mouth 2 (two) times daily as needed (fluid retention).   Yes Historical Provider, MD  gabapentin (NEURONTIN) 400 MG capsule Take 2 capsules (800 mg total) by mouth 2 (two) times daily. Patient taking differently: Take 400-800 mg by mouth 2 (two) times daily. Pt takes 400 mg BID and 800 mg BID.= 2400 mg a day. 10/16/14  Yes Maryann Mikhail, DO  gabapentin (NEURONTIN) 800 MG tablet Take 800 mg by mouth 2 (two) times daily.   Yes Historical Provider, MD  hydrOXYzine (ATARAX/VISTARIL) 25 MG tablet Take 50 mg by mouth every 6 (six) hours as needed for anxiety (anxiety).    Yes Historical Provider, MD  methocarbamol (ROBAXIN) 500 MG tablet Take 1 tablet (500 mg total) by mouth every 6 (six) hours as needed for muscle spasms. 09/16/15  Yes Avel Peace, PA-C  metoCLOPramide (REGLAN) 5 MG tablet Take 1-2 tablets (5-10 mg total) by mouth every 8 (eight) hours as needed for nausea (if ondansetron (ZOFRAN) ineffective.). 11/19/14  Yes Avel Peace, PA-C  morphine (MSIR) 15 MG tablet Take 1 tablet (15 mg total) by mouth every 8 (eight) hours as needed for severe pain. 09/16/15  Yes Avel Peace, PA-C  Naloxegol Oxalate (MOVANTIK PO) Take 25 mg by mouth at bedtime.    Yes Historical Provider, MD  omeprazole (PRILOSEC) 40 MG capsule Take 40 mg by mouth daily.   Yes Historical Provider, MD   ondansetron (ZOFRAN) 4 MG tablet Take 1 tablet (4 mg total) by mouth every 6 (six) hours as needed for nausea. 11/19/14  Yes Avel Peace, PA-C  oxybutynin (DITROPAN) 5 MG tablet Take 5 mg by mouth 3 (three) times daily.   Yes Historical Provider, MD  oxyCODONE (OXY IR/ROXICODONE) 5 MG immediate release tablet Take 1-2 tablets (5-10 mg total) by mouth every 3 (three) hours as needed for moderate pain or severe pain. 09/16/15  Yes Avel Peace, PA-C  oxymorphone (OPANA ER) 10 MG 12 hr tablet Take 1 tablet (10 mg total) by mouth every 12 (twelve) hours. Patient taking differently: Take 10 mg by mouth 3 (three) times daily.  09/16/15  Yes Avel Peace, PA-C  polyethylene glycol (MIRALAX / GLYCOLAX) packet Take 17 g by mouth daily as needed for mild constipation. 09/16/15  Yes Avel Peace, PA-C  potassium chloride SA (K-DUR,KLOR-CON) 20 MEQ tablet Take 20 mEq by mouth at bedtime.   Yes Historical Provider, MD  promethazine (PHENERGAN) 12.5 MG tablet Take 1 tablet (12.5 mg total) by mouth every 6 (six) hours as needed for nausea or vomiting. 10/25/14  Yes Cliffton Asters, MD  rivaroxaban (XARELTO) 10 MG TABS tablet Take 1 tablet (10 mg total) by mouth daily with breakfast. Take Xarelto for two and a half more weeks, then discontinue Xarelto. Once the patient has completed the blood thinner regimen, then take a Baby 81 mg Aspirin daily for three more weeks. 09/16/15  Yes Avel Peace, PA-C  sodium chloride (OCEAN) 0.65 % SOLN nasal spray Place 2 sprays into both nostrils daily as needed for congestion (dryness).    Yes Historical Provider, MD  sodium phosphate (FLEET) 7-19 GM/118ML ENEM Place 133 mLs (1 enema total) rectally once as needed for severe constipation. 09/16/15  Yes Avel Peace, PA-C  SUMAtriptan (IMITREX) 100 MG tablet Take 100 mg by mouth once. May repeat in 2 hours if headache persists or recurs. NO MORE THAN 2 IN A 24 HOUR PERIOD   Yes Historical Provider, MD  traZODone (  DESYREL) 150 MG  tablet Take 150 mg by mouth at bedtime as needed for sleep.    Yes Historical Provider, MD  triamcinolone cream (KENALOG) 0.1 % Apply 1 application topically 2 (two) times daily as needed (irritation). CALF OF BOTH LEGS - BECAUSE OF SKIN IRRITATION THOUGHT TO BE DUE TO SWELLING OF LEGS   Yes Historical Provider, MD  tuberculin (TUBERSOL) 5 UNIT/0.1ML injection Inject 5 Units into the skin once.   Yes Historical Provider, MD  doxycycline (VIBRAMYCIN) 100 MG capsule Take 1 capsule (100 mg total) by mouth 2 (two) times daily. 09/21/15   Satira Sark Jaelynn Currier, PA-C   BP 156/72 mmHg  Pulse 90  Temp(Src) 98.8 F (37.1 C) (Oral)  Resp 14  SpO2 96% Physical Exam  Constitutional: She is oriented to person, place, and time. She appears well-developed and well-nourished. No distress.  HENT:  Head: Normocephalic and atraumatic.  Mouth/Throat: Oropharynx is clear and moist.  Eyes: Conjunctivae and EOM are normal. Right eye exhibits no discharge. Left eye exhibits no discharge. No scleral icterus.  Neck: Normal range of motion. Neck supple.  Cardiovascular: Normal rate, regular rhythm, normal heart sounds and intact distal pulses.   Pulmonary/Chest: Effort normal and breath sounds normal. No respiratory distress. She has no wheezes. She has no rales. She exhibits no tenderness.  Abdominal: Soft. Bowel sounds are normal. She exhibits no distension and no mass. There is no tenderness. There is no rebound and no guarding.  Musculoskeletal: She exhibits no edema.  Dec. ROM and strength of left hip and leg due to pain.  Lymphadenopathy:    She has no cervical adenopathy.  Neurological: She is alert and oriented to person, place, and time.  Skin: Skin is warm and dry. She is not diaphoretic.  20cm healing incision with staples in place to left lateral hip/proximal thigh, small area of dried serosanguinous drainage to mid-incision, no surrounding erythema, swelling or drainage, nontender  Nursing note and  vitals reviewed.   ED Course  Procedures (including critical care time) Labs Review Labs Reviewed  CBC WITH DIFFERENTIAL/PLATELET - Abnormal; Notable for the following:    RBC 2.78 (*)    Hemoglobin 8.5 (*)    HCT 25.3 (*)    All other components within normal limits  BASIC METABOLIC PANEL - Abnormal; Notable for the following:    Chloride 100 (*)    CO2 33 (*)    Glucose, Bld 115 (*)    Anion gap 4 (*)    All other components within normal limits    Imaging Review No results found. I have personally reviewed and evaluated these images and lab results as part of my medical decision-making.  Filed Vitals:   09/21/15 1907 09/21/15 2210  BP: 141/61 156/72  Pulse: 83 90  Temp: 98.8 F (37.1 C)   Resp: 18 14     MDM   Final diagnoses:  History of total left hip arthroplasty    Patient presents with drainage from her surgical site for the past 2 days. She reports having left hip replacement on 11-18 by Dr.Aluisio. Denies fever. VSS. Exam revealed well healing surgical incision with staples in place to left lateral hip/proximal thigh, small area of dried serosanguineous drainage at mid-incision, no surrounding erythema swelling or drainage. Labs unremarkable. Consulted orthopedics, Dr. Shon Baton reports his PA will come see the patient in the ED. Ortho PA evaluated patient in the ED, recommended sending patient home on 100 mg doxycycline twice a day and to have her  follow-up with Dr. Lequita Halt at her scheduled appointment on Tuesday. Discussed results and plan for discharge with patient.  Evaluation does not show pathology requring ongoing emergent intervention or admission. Pt is hemodynamically stable and mentating appropriately. Discussed findings/results and plan with patient/guardian, who agrees with plan. All questions answered. Return precautions discussed and outpatient follow up given.      Satira Sark [place], [place] 09/22/15 4098  Rolan Bucco, MD 09/22/15 1355

## 2015-09-21 NOTE — Discharge Instructions (Signed)
Take your medications as prescribed. Follow-up with Dr. Lequita HaltAluisio at your scheduled appointment on Tuesday. Please return to the Emergency Department if symptoms worsen or new onset of fever, drainage, numbness, tingling, weakness.

## 2015-09-21 NOTE — ED Notes (Signed)
Bed: [facility] Expected date:  Expected time:  Means of arrival:  Comments: 

## 2015-09-21 NOTE — ED Notes (Signed)
Awake. Verbally responsive. A/O x4. Resp even and unlabored. No audible adventitious breath sounds noted. ABC's intact. Family at bedside. 

## 2015-09-21 NOTE — ED Notes (Signed)
Pt from [facility] c/o drainage from surgical site. Pt reports to EMS that incision has copious amount of drainage but has not looked at it since yesterday. Pt can stand and pivot to bed, but has difficulty ambulating. Pt is A&O and in NAD

## 2015-09-21 NOTE — ED Notes (Signed)
Pt reported having drainage from sx incisional site. Noted small amt of bloody drainage. Staples intact. No redness/foul odor noted.

## 2015-09-21 NOTE — ED Notes (Signed)
Bed: [facility] Expected date:  Expected time:  Means of arrival:  Comments: 

## 2015-09-21 NOTE — Progress Notes (Signed)
Patient ID: [name], female   DOB: September 17, 1956, 59 y.o.   MRN: [id number]    Patient ID: [name] JustDeborah Bourquin MRN: [id number] DOB/AGE: September 17, 1956 59 y.o.  Admit date: 09/21/2015  Admission Diagnoses:  S/P Hip replacement revision History of MRSA  HPI: Very pleasant 59 year old female pt S/P revision of hip replacement on 11/18.  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  The pt is currently residing at [facility].  She woke up 2 morning in a row with a discharge from her incisional wound soiling her bedding.  She denies fever and chills. She denies increasing pain of the incision.  The staff at the SNF felt like she should be check out at the hospital.  Past Medical History: Past Medical History  Diagnosis Date  . Tenosynovitis of fingers     RIGHT RING AND SMALL-HAD SURGERY 07/12/14 [facility]  . Bronchitis     dx  07-04-2014 (approx) will finished antibiotic 07-14-2014  . History of concussion     x4  --  no residual  . Migraines   . Depression   . 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 GERD (gastroesophageal reflux disease)   . H/O hiatal hernia   . Chronic constipation   . Chronic narcotic use   . Nocturia   . SUI (stress urinary incontinence, female)   . OA (osteoarthritis)     hips, knees, hips  . 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 Full dentures   . History of cellulitis     lower extremities  . Thinning of skin     easily tears  . Venous stasis dermatitis     BILATERAL LEGS  . Productive cough     RESOLVED - QUIT SMOKING  . Memory problem   . Transfusion history   . OSA on CPAP     study x3   last one few yrs ago  cpap recommended -- DOES NOT USE OR HAVE CPAP.09-05-15 pt states"being evaluated-no cpap machine yet".    Surgical  History: Past Surgical History  Procedure Laterality Date  . Appendectomy  721973 (age 59)  . 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 Orif left foot fx  1990's  . Orif right tibial fx  2008    hardware removed same year  . Open repair and fixation right tibia and knee  X2  2009  . Total knee arthroplasty Right X2  2009  &  2010  . Total knee arthroplasty Left 2009  . Knee arthroscopy Left 2009  . Orif pelvic fx's  11 /2011  . Total hip arthroplasty Left 2012  . Revision total hip arthroplasty Left 01/ 2015  . Removal prothesis  left hip arthroplasty  02/ 2015    INFECTION  . Laparoscopic cholecystectomy  08-12-2003  . Tenosynovectomy Right 07/12/2014    Procedure: RIGHT RING/SMALL FINGER TENOSYNOVECTOMY;  Surgeon: Sharma CovertFred W Ortmann, MD;  [facility];  Service: Orthopedics;  Laterality: Right;  . Total hip revision Left 08/24/2014    Procedure: INCISION AND DRAINAGE LEFT HIP, EXCHANGE ANTIBIOTIC SPACER,  OPEN REDUCTION LEFT FEMUR FRACTURE ;  Surgeon: Loanne Drilling, MD;  Location: [facility];  Service: Orthopedics;  Laterality: Left;  . Total hip revision Left 10/14/2014    Procedure: PARTIAL HIP REVISION;  Surgeon: Shelda Pal, MD;  Location: [facility];  Service: Orthopedics;  Laterality: Left;  . Reimplantation of cement spacer hip Left 11/17/2014    Procedure: IRRIGATION AND DEBRIDEMENT AND REIMPLANTATION OF CEMENT SPACER HIP;  Surgeon: Loanne Drilling, MD;  Location: [facility];  Service: Orthopedics;  Laterality: Left;  . Reimplantation of total hip Left 09/13/2015    Procedure: REIMPLANTATION OF TOTAL  LEFT HIP ARTHOPLASTY;  Surgeon: Ollen Gross, MD;  Location: [facility];  Service: Orthopedics;  Laterality: Left;    Family History: Family History  Problem Relation Age of Onset  . COPD Mother   . Hypertension Father   . Pancreatic cancer Father   . COPD Brother     Social  History: Social History   Social History  . Marital Status: Married    Spouse Name: N/A  . Number of Children: N/A  . Years of Education: N/A   Occupational History  . Not on file.   Social History Main Topics  . Smoking status: Former Smoker -- 1.00 packs/day for 30 years    Types: Cigarettes    Quit date: 07/09/2014  . Smokeless tobacco: Never Used     Comment: PT STATES ATTEMPTING TO QUIT SMOKING 07-09-2014--  USING NICOTIN LOZENGERS  . Alcohol Use: No  . Drug Use: No  . Sexual Activity: Not on file   Other Topics Concern  . Not on file   Social History Narrative    Allergies: Tomato and Vancomycin  Medications: I have reviewed the patient's current medications.  Vital Signs: Patient Vitals for the past 24 hrs:  BP Temp Temp src Pulse Resp SpO2  09/21/15 1907 141/61 mmHg 98.8 F (37.1 C) Oral 83 18 100 %  09/21/15 1806 126/75 mmHg - - 83 18 100 %  09/21/15 1630 141/81 mmHg 98.2 F (36.8 C) Oral 81 18 100 %    Radiology: Dg Pelvis Portable  09/13/2015  CLINICAL DATA:  Status post left hip replacement revision EXAM: PORTABLE PELVIS 1-2 VIEWS COMPARISON:  10/14/2014 FINDINGS: Interval revision of left hip arthroplasty femoral and acetabular components, which project in expected location. Metallic rod lateral to the midshaft of the femur with cerclage wires as before. Knee arthroplasty hardware is incompletely visualized. No fracture or dislocation. Bilateral pelvic phleboliths. IMPRESSION: 1. Left hip arthroplasty revision without apparent complication. Electronically Signed   By: Corlis Leak M.D.   On: 09/13/2015 17:33    Labs:  Recent Labs  09/21/15 1604  WBC 6.0  RBC 2.78*  HCT 25.3*  PLT 190    Recent Labs  09/21/15 1604  NA 137  K 4.1  CL 100*  CO2 33*  BUN 9  CREATININE 0.67  GLUCOSE 115*  CALCIUM 8.9   No results for input(s): LABPT, INR in the last 72 hours.  Review of Systems: ROS  Physical Exam: Sensation intact distally Intact  pulses distally Incision: long incison closaed with staples, the middle of the incision has some erythema present Compartment soft  Discharge present on bandages  Assessment and Plan: Start the pt on Keflex 500 mg qid for 10 days Pt may return to SNF Discussed pt with Dr. Shon Baton We will inform Dr. Berton Lan of the pt ED visit  [facility] [phone]

## 2015-09-21 NOTE — ED Notes (Signed)
[facility] Expected date:  Expected time:  Means of arrival:  Comments: Leg pain

## 2015-09-23 ENCOUNTER — Non-Acute Institutional Stay (SKILLED_NURSING_FACILITY): Payer: Medicare Other | Admitting: Nurse Practitioner

## 2015-09-23 DIAGNOSIS — D62 Acute posthemorrhagic anemia: Secondary | ICD-10-CM

## 2015-09-23 DIAGNOSIS — T402X5A Adverse effect of other opioids, initial encounter: Secondary | ICD-10-CM

## 2015-09-23 DIAGNOSIS — M792 Neuralgia and neuritis, unspecified: Secondary | ICD-10-CM

## 2015-09-23 DIAGNOSIS — J438 Other emphysema: Secondary | ICD-10-CM | POA: Diagnosis not present

## 2015-09-23 DIAGNOSIS — T84018S Broken internal joint prosthesis, other site, sequela: Secondary | ICD-10-CM

## 2015-09-23 DIAGNOSIS — R600 Localized edema: Secondary | ICD-10-CM | POA: Diagnosis not present

## 2015-09-23 DIAGNOSIS — N3281 Overactive bladder: Secondary | ICD-10-CM | POA: Diagnosis not present

## 2015-09-23 DIAGNOSIS — K2100 Gastro-esophageal reflux disease with esophagitis, without bleeding: Secondary | ICD-10-CM

## 2015-09-23 DIAGNOSIS — M62830 Muscle spasm of back: Secondary | ICD-10-CM

## 2015-09-23 DIAGNOSIS — T84098S Other mechanical complication of other internal joint prosthesis, sequela: Secondary | ICD-10-CM | POA: Diagnosis not present

## 2015-09-23 DIAGNOSIS — Z96649 Presence of unspecified artificial hip joint: Principal | ICD-10-CM

## 2015-09-23 DIAGNOSIS — K5903 Drug induced constipation: Secondary | ICD-10-CM | POA: Diagnosis not present

## 2015-09-23 NOTE — Progress Notes (Signed)
Patient ID: [name], female   DOB: 1955-12-26, 59 y.o.   MRN: [id number]    Nursing Home Location:  [facility]   Place of Service: SNF (31)  PCP: Carolynn Serve, MD  Allergies  Allergen Reactions  . Tomato Nausea And Vomiting    Other reaction(s): Other (See Comments) GI Upset  . Vancomycin Other (See Comments)    LEUKOPENIA    Chief Complaint  Patient presents with  . Discharge Note    HPI:  Patient is a 59 y.o. female seen today at [facility] for discharge home. Pt here for short term rehabilitation post hospital admission from 09/13/15-09/16/15 post total hip arthroplasty reimplantation of left hip. Has had multiple revisions and surgeries. Ongoing issues with pain control. Pt with chronic pain and has been followed by pain clinic is [place]. Pt is ready to go home and feels like she would get more benefit with home health.  Was seen in the ED 2 days ago due to increase drainage to incision. Placed on doxycyline due to hx of MRSA infection in the past. No increase in pain or erythema to incision.  Review of Systems:  Review of Systems  Constitutional: Negative for activity change, appetite change, fatigue and unexpected weight change.  HENT: Negative for congestion and hearing loss.   Eyes: Negative.   Respiratory: Negative for cough and shortness of breath.   Cardiovascular: Negative for chest pain, palpitations and leg swelling.  Gastrointestinal: Negative for abdominal pain, diarrhea and constipation.  Genitourinary: Negative for dysuria and difficulty urinating.  Musculoskeletal: Positive for myalgias and back pain. Negative for arthralgias.       Pain in back and left hip   Skin: Negative for color change.  Neurological: Negative for dizziness and weakness.  Psychiatric/Behavioral: Negative for behavioral problems, confusion and agitation.    Past Medical History  Diagnosis Date  . Tenosynovitis of fingers     RIGHT RING  AND SMALL-HAD SURGERY 07/12/14 [facility]  . Bronchitis     dx  07-04-2014 (approx) will finished antibiotic 07-14-2014  . History of concussion     x4  --  no residual  . Migraines   . Depression   . 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 GERD (gastroesophageal reflux disease)   . H/O hiatal hernia   . Chronic constipation   . Chronic narcotic use   . Nocturia   . SUI (stress urinary incontinence, female)   . OA (osteoarthritis)     hips, knees, hips  . 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 Full dentures   . History of cellulitis     lower extremities  . Thinning of skin     easily tears  . Venous stasis dermatitis     BILATERAL LEGS  . Productive cough     RESOLVED - QUIT SMOKING  . Memory problem   . Transfusion history   . OSA on CPAP     study x3   last one few yrs ago  cpap recommended -- DOES NOT USE OR HAVE CPAP.09-05-15 pt states"being evaluated-no cpap machine yet".   Past Surgical History  Procedure Laterality Date  . Appendectomy  85 (age 55)  . 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 Orif left foot fx  1990's  . Orif right tibial fx  2008    hardware removed same year  . Open repair and fixation right tibia and knee  X2  2009  . Total knee arthroplasty Right X2  2009  &  2010  . Total knee arthroplasty Left 2009  . Knee arthroscopy Left 2009  . Orif pelvic fx's  11 /2011  . Total hip arthroplasty Left 2012  . Revision total hip arthroplasty Left 01/ 2015  . Removal prothesis  left hip arthroplasty  02/ 2015    INFECTION  . Laparoscopic cholecystectomy  08-12-2003  . Tenosynovectomy Right 07/12/2014    Procedure: RIGHT RING/SMALL FINGER TENOSYNOVECTOMY;  Surgeon: Sharma Covert, MD;  Location:  [facility];  Service: Orthopedics;  Laterality: Right;  . Total hip revision Left 08/24/2014    Procedure: INCISION AND DRAINAGE LEFT HIP, EXCHANGE ANTIBIOTIC SPACER, OPEN REDUCTION LEFT FEMUR FRACTURE ;  Surgeon: Loanne Drilling, MD;  Location: [facility];  Service: Orthopedics;  Laterality: Left;  . Total hip revision Left 10/14/2014    Procedure: PARTIAL HIP REVISION;  Surgeon: Shelda Pal, MD;  Location: [facility];  Service: Orthopedics;  Laterality: Left;  . Reimplantation of cement spacer hip Left 11/17/2014    Procedure: IRRIGATION AND DEBRIDEMENT AND REIMPLANTATION OF CEMENT SPACER HIP;  Surgeon: Loanne Drilling, MD;  Location: [facility];  Service: Orthopedics;  Laterality: Left;  . Reimplantation of total hip Left 09/13/2015    Procedure: REIMPLANTATION OF TOTAL  LEFT HIP ARTHOPLASTY;  Surgeon: Ollen Gross, MD;  Location: [facility];  Service: Orthopedics;  Laterality: Left;   Social History:   reports that she quit smoking about 14 months ago. Her smoking use included Cigarettes. She has a 30 pack-year smoking history. She has never used smokeless tobacco. She reports that she does not drink alcohol or use illicit drugs.  Family History  Problem Relation Age of Onset  . COPD Mother   . Hypertension Father   . Pancreatic cancer Father   . COPD Brother     Medications: Patient's Medications  New Prescriptions   No medications on file  Previous Medications   ACETAMINOPHEN (TYLENOL) 325 MG TABLET    Take 2 tablets (650 mg total) by mouth every 6 (six) hours as needed for mild pain (or Fever >/= 101).   ALBUTEROL (PROVENTIL HFA;VENTOLIN HFA) 108 (90 BASE) MCG/ACT INHALER    Inhale into the lungs.   BACLOFEN (LIORESAL) 10 MG TABLET    Take 5 mg by mouth 3 (three) times daily.   BISACODYL (DULCOLAX) 10 MG SUPPOSITORY    Place 1 suppository (10 mg total) rectally daily as needed for moderate constipation.   BUDESONIDE-FORMOTEROL (SYMBICORT) 160-4.5 MCG/ACT INHALER    Inhale 1-2  puffs into the lungs 2 (two) times daily.   CALCIUM-VITAMIN D (CALCIUM 500/D) 500-200 MG-UNIT TABLET    Take 1 tablet by mouth 3 (three) times daily.   CLOTRIMAZOLE (LOTRIMIN) 1 % CREAM    Apply 1 application topically 2 (two) times daily. To area left upper thigh   CLOTRIMAZOLE-BETAMETHASONE (LOTRISONE) CREAM    Apply 1 application topically 2 (two) times daily as needed (rash in skin folds).   DIPHENHYDRAMINE (BENADRYL) 12.5 MG/5ML ELIXIR    Take 5-10 mLs (12.5-25 mg total) by mouth every 4 (four) hours as needed for itching.  DOCUSATE SODIUM (COLACE) 100 MG CAPSULE    Take 1 capsule (100 mg total) by mouth 2 (two) times daily.   DOXYCYCLINE (VIBRAMYCIN) 100 MG CAPSULE    Take 1 capsule (100 mg total) by mouth 2 (two) times daily.   FERROUS SULFATE 325 (65 FE) MG EC TABLET    Take 325 mg by mouth 3 (three) times a week.    FUROSEMIDE (LASIX) 40 MG TABLET    Take 40 mg by mouth 2 (two) times daily as needed (fluid retention).   GABAPENTIN (NEURONTIN) 400 MG CAPSULE    Take 2 capsules (800 mg total) by mouth 2 (two) times daily.   GABAPENTIN (NEURONTIN) 800 MG TABLET    Take 800 mg by mouth 2 (two) times daily.   HYDROXYZINE (ATARAX/VISTARIL) 25 MG TABLET    Take 50 mg by mouth every 6 (six) hours as needed for anxiety (anxiety).    METHOCARBAMOL (ROBAXIN) 500 MG TABLET    Take 1 tablet (500 mg total) by mouth every 6 (six) hours as needed for muscle spasms.   METOCLOPRAMIDE (REGLAN) 5 MG TABLET    Take 1-2 tablets (5-10 mg total) by mouth every 8 (eight) hours as needed for nausea (if ondansetron (ZOFRAN) ineffective.).   MORPHINE (MSIR) 15 MG TABLET    Take 1 tablet (15 mg total) by mouth every 8 (eight) hours as needed for severe pain.   NALOXEGOL OXALATE (MOVANTIK PO)    Take 25 mg by mouth at bedtime.    OMEPRAZOLE (PRILOSEC) 40 MG CAPSULE    Take 40 mg by mouth daily.   ONDANSETRON (ZOFRAN) 4 MG TABLET    Take 1 tablet (4 mg total) by mouth every 6 (six) hours as needed for nausea.    OXYBUTYNIN (DITROPAN) 5 MG TABLET    Take 5 mg by mouth 3 (three) times daily.   OXYCODONE (OXY IR/ROXICODONE) 5 MG IMMEDIATE RELEASE TABLET    Take 1-2 tablets (5-10 mg total) by mouth every 3 (three) hours as needed for moderate pain or severe pain.   OXYMORPHONE (OPANA ER) 10 MG 12 HR TABLET    Take 1 tablet (10 mg total) by mouth every 12 (twelve) hours.   POLYETHYLENE GLYCOL (MIRALAX / GLYCOLAX) PACKET    Take 17 g by mouth daily as needed for mild constipation.   POTASSIUM CHLORIDE SA (K-DUR,KLOR-CON) 20 MEQ TABLET    Take 20 mEq by mouth at bedtime.   PROMETHAZINE (PHENERGAN) 12.5 MG TABLET    Take 1 tablet (12.5 mg total) by mouth every 6 (six) hours as needed for nausea or vomiting.   RIVAROXABAN (XARELTO) 10 MG TABS TABLET    Take 1 tablet (10 mg total) by mouth daily with breakfast. Take Xarelto for two and a half more weeks, then discontinue Xarelto. Once the patient has completed the blood thinner regimen, then take a Baby 81 mg Aspirin daily for three more weeks.   SODIUM CHLORIDE (OCEAN) 0.65 % SOLN NASAL SPRAY    Place 2 sprays into both nostrils daily as needed for congestion (dryness).    SODIUM PHOSPHATE (FLEET) 7-19 GM/118ML ENEM    Place 133 mLs (1 enema total) rectally once as needed for severe constipation.   SUMATRIPTAN (IMITREX) 100 MG TABLET    Take 100 mg by mouth once. May repeat in 2 hours if headache persists or recurs. NO MORE THAN 2 IN A 24 HOUR PERIOD   TRAZODONE (DESYREL) 150 MG TABLET    Take 150 mg by  mouth at bedtime as needed for sleep.    TRIAMCINOLONE CREAM (KENALOG) 0.1 %    Apply 1 application topically 2 (two) times daily as needed (irritation). CALF OF BOTH LEGS - BECAUSE OF SKIN IRRITATION THOUGHT TO BE DUE TO SWELLING OF LEGS   TUBERCULIN (TUBERSOL) 5 UNIT/0.1ML INJECTION    Inject 5 Units into the skin once.  Modified Medications   No medications on file  Discontinued Medications   No medications on file     Physical Exam: Filed Vitals:   09/23/15  1156  BP: 117/78  Pulse: 79  Temp: 97.8 F (36.6 C)  Resp: 20    Physical Exam  Constitutional: She is oriented to person, place, and time. She appears well-developed and well-nourished. No distress.  HENT:  Head: Normocephalic and atraumatic.  Mouth/Throat: Oropharynx is clear and moist. No oropharyngeal exudate.  Eyes: Conjunctivae are normal. Pupils are equal, round, and reactive to light.  Neck: Normal range of motion. Neck supple.  Cardiovascular: Normal rate, regular rhythm and normal heart sounds.   Pulmonary/Chest: Effort normal and breath sounds normal.  Abdominal: Soft. Bowel sounds are normal.  Musculoskeletal: She exhibits no edema or tenderness.  Uses motorized WC  Neurological: She is alert and oriented to person, place, and time.  Skin: Skin is warm and dry. She is not diaphoretic.  Long incision to left hip, minimal bleed at mid area with mid serous drainage to dressing, no erythema or warmth  Psychiatric: She has a normal mood and affect.    Labs reviewed: Basic Metabolic Panel:  Recent Labs  16/10/96 0339 09/15/15 0550 09/21/15 1604  NA 139 142 137  K 4.3 4.3 4.1  CL 107 108 100*  CO2 24 28 33*  GLUCOSE 121* 109* 115*  BUN CREATININE 0.65 0.61 0.67  CALCIUM 8.4* 8.7* 8.9   Liver Function Tests:  Recent Labs  11/15/14 1913 11/16/14 0520 09/05/15 1530  AST ALT ALKPHOS 110 88 93  BILITOT 0.2* 0.3 0.8  PROT 7.6 6.3 7.3  ALBUMIN 2.8* 2.4* 4.0   No results for input(s): LIPASE, AMYLASE in the last 8760 hours. No results for input(s): AMMONIA in the last 8760 hours. CBC:  Recent Labs  11/15/14 1913  09/15/15 0550 09/16/15 0505 09/21/15 1604  WBC 2.3*  < > 5.5 6.2 6.0  NEUTROABS 1.6*  --   --   --  4.4  HGB 9.9*  < > 8.9* 9.0* 8.5*  HCT 30.2*  < > 25.8* 26.7* 25.3*  MCV 91.8  < > 89.0 89.9 91.0  PLT 138*  < > 90* 103* 190  < > = values in this interval not displayed. TSH: No results for input(s): TSH in  the last 8760 hours. A1C: No results found for: HGBA1C Lipid Panel: No results for input(s): CHOL, HDL, LDLCALC, TRIG, CHOLHDL, LDLDIRECT in the last 8760 hours.   Assessment/Plan 1. Failed total hip arthroplasty, sequela S/p left total hip arthroplasty reimplantation. Her pain is not under control with current pain regimen. opana 10 mg ER BID was changed to three times a day and remains on morphine IR 15 mg q8h prn, tramadol 50 mg 1-2 tab q6h prn and oxyIR 5 mg 1-2 tab q3h prn for breakthrough pain. Continue robaxin 500 mg q6h prn muscle spasm. Pt is established with a pain clinic and has been on the phone with them this morning for pain medications after discharge.  Pt was  seen in ED due to increased drainage to incision. This has been stable and pt now on doxycyline for total for 10 days.   Has follow up with orthopedics in 3 days.  2. Acute blood loss anemia Currently on iron three times a week, will need ongoing monitoring of CBC as outpatient   3. Therapeutic opioid induced constipation Well controlled on movantik 25 mg qhs and colace 100 mg twice daily  4. Back muscle spasm Stable, conts on robaxin and baclofen  5. Neuropathic pain Currently on gabapenin 800 mg twice daily, pain clinic prescribing medication therefore will not write these Rx on discharge.   6. Gastroesophageal reflux disease with esophagitis Stable on prilosec 40 mg daily  7. Edema of left lower extremity Stable, conts on lasix BID and potassium for supplement   8. Other emphysema (HCC) conts to smoke despite counseling, conts on symbicort twice daily and proventil PRN  9. OAB (overactive bladder) Stable on oxybutynin home regimen, to continue.    10. Hx of DVT conts on xarelto daily  pt is stable for discharge-will need PT/OT/Nursing/hha per home health. [id number] WC. Rx written except for pain medication and gabapentin.  will need to follow up with PCP within 2 weeks.       Janene Harvey.  Biagio Borg  [facility] [phone] 8 am - 5 pm) [phone] (after hours)

## 2016-10-13 ENCOUNTER — Other Ambulatory Visit: Payer: Self-pay | Admitting: Neurosurgery

## 2016-10-13 DIAGNOSIS — M4722 Other spondylosis with radiculopathy, cervical region: Secondary | ICD-10-CM

## 2016-10-21 ENCOUNTER — Ambulatory Visit
Admission: RE | Admit: 2016-10-21 | Discharge: 2016-10-21 | Disposition: A | Discharge status: Home / Self Care | Payer: Medicare Other | Source: Ambulatory Visit | Attending: Neurosurgery | Admitting: Neurosurgery

## 2016-10-21 DIAGNOSIS — M4722 Other spondylosis with radiculopathy, cervical region: Secondary | ICD-10-CM
